# Patient Record
Sex: Male | Born: 1935 | Race: White | Hispanic: No | Smoking: Former smoker
Health system: Southern US, Community
[De-identification: ages and names within clinical notes are randomized; demographics above are authoritative.]

## PROBLEM LIST (undated history)

## (undated) DIAGNOSIS — R600 Localized edema: Secondary | ICD-10-CM

## (undated) DIAGNOSIS — Q219 Congenital malformation of cardiac septum, unspecified: Secondary | ICD-10-CM

## (undated) DIAGNOSIS — I447 Left bundle-branch block, unspecified: Secondary | ICD-10-CM

## (undated) DIAGNOSIS — H3530 Unspecified macular degeneration: Secondary | ICD-10-CM

## (undated) DIAGNOSIS — K429 Umbilical hernia without obstruction or gangrene: Secondary | ICD-10-CM

## (undated) DIAGNOSIS — G4733 Obstructive sleep apnea (adult) (pediatric): Secondary | ICD-10-CM

## (undated) DIAGNOSIS — E669 Obesity, unspecified: Secondary | ICD-10-CM

## (undated) DIAGNOSIS — Z23 Encounter for immunization: Secondary | ICD-10-CM

## (undated) DIAGNOSIS — Z950 Presence of cardiac pacemaker: Secondary | ICD-10-CM

## (undated) DIAGNOSIS — M479 Spondylosis, unspecified: Secondary | ICD-10-CM

## (undated) DIAGNOSIS — E785 Hyperlipidemia, unspecified: Secondary | ICD-10-CM

## (undated) DIAGNOSIS — D649 Anemia, unspecified: Secondary | ICD-10-CM

## (undated) DIAGNOSIS — I441 Atrioventricular block, second degree: Secondary | ICD-10-CM

## (undated) DIAGNOSIS — Z86010 Personal history of colonic polyps: Secondary | ICD-10-CM

## (undated) DIAGNOSIS — I442 Atrioventricular block, complete: Secondary | ICD-10-CM

## (undated) DIAGNOSIS — Z5189 Encounter for other specified aftercare: Secondary | ICD-10-CM

## (undated) DIAGNOSIS — K219 Gastro-esophageal reflux disease without esophagitis: Secondary | ICD-10-CM

## (undated) DIAGNOSIS — I872 Venous insufficiency (chronic) (peripheral): Secondary | ICD-10-CM

## (undated) DIAGNOSIS — C61 Malignant neoplasm of prostate: Secondary | ICD-10-CM

## (undated) DIAGNOSIS — K5730 Diverticulosis of large intestine without perforation or abscess without bleeding: Secondary | ICD-10-CM

## (undated) DIAGNOSIS — R413 Other amnesia: Secondary | ICD-10-CM

## (undated) DIAGNOSIS — I4460 Unspecified fascicular block: Secondary | ICD-10-CM

## (undated) DIAGNOSIS — E7800 Pure hypercholesterolemia, unspecified: Secondary | ICD-10-CM

## (undated) DIAGNOSIS — IMO0001 Reserved for inherently not codable concepts without codable children: Secondary | ICD-10-CM

## (undated) DIAGNOSIS — L409 Psoriasis, unspecified: Secondary | ICD-10-CM

## (undated) HISTORY — DX: Obstructive sleep apnea (adult) (pediatric): G47.33

## (undated) HISTORY — DX: Gastro-esophageal reflux disease without esophagitis: K21.9

## (undated) HISTORY — DX: Encounter for immunization: Z23

## (undated) HISTORY — DX: Spondylosis, unspecified: M47.9

## (undated) HISTORY — DX: Presence of cardiac pacemaker: Z95.0

## (undated) HISTORY — DX: Diverticulosis of large intestine without perforation or abscess without bleeding: K57.30

## (undated) HISTORY — DX: Personal history of colonic polyps: Z86.010

## (undated) HISTORY — DX: Hyperlipidemia, unspecified: E78.5

## (undated) HISTORY — DX: Encounter for other specified aftercare: Z51.89

## (undated) HISTORY — DX: Malignant neoplasm of prostate: C61

## (undated) HISTORY — DX: Unspecified macular degeneration: H35.30

## (undated) HISTORY — DX: Pure hypercholesterolemia, unspecified: E78.00

## (undated) HISTORY — DX: Venous insufficiency (chronic) (peripheral): I87.2

## (undated) HISTORY — DX: Obesity, unspecified: E66.9

## (undated) HISTORY — DX: Unspecified fascicular block: I44.60

## (undated) HISTORY — DX: Localized edema: R60.0

## (undated) HISTORY — DX: Umbilical hernia without obstruction or gangrene: K42.9

## (undated) HISTORY — DX: Other amnesia: R41.3

## (undated) HISTORY — DX: Psoriasis, unspecified: L40.9

## (undated) HISTORY — DX: Reserved for inherently not codable concepts without codable children: IMO0001

## (undated) HISTORY — DX: Left bundle-branch block, unspecified: I44.7

## (undated) HISTORY — DX: Atrioventricular block, complete: I44.2

## (undated) HISTORY — DX: Atrioventricular block, second degree: I44.1

## (undated) HISTORY — PX: TONSILLECTOMY: SHX5217

## (undated) HISTORY — DX: Congenital malformation of cardiac septum, unspecified: Q21.9

## (undated) HISTORY — DX: Anemia, unspecified: D64.9

---

## 1998-05-10 ENCOUNTER — Encounter: Admission: RE | Admit: 1998-05-10 | Discharge: 1998-05-10 | Payer: Self-pay | Admitting: Family Medicine

## 1998-05-17 ENCOUNTER — Encounter: Admission: RE | Admit: 1998-05-17 | Discharge: 1998-05-17 | Payer: Self-pay | Admitting: Family Medicine

## 1998-06-29 ENCOUNTER — Encounter: Admission: RE | Admit: 1998-06-29 | Discharge: 1998-06-29 | Payer: Self-pay | Admitting: Family Medicine

## 1998-07-13 ENCOUNTER — Encounter: Admission: RE | Admit: 1998-07-13 | Discharge: 1998-07-13 | Payer: Self-pay | Admitting: Family Medicine

## 1998-08-24 ENCOUNTER — Encounter: Admission: RE | Admit: 1998-08-24 | Discharge: 1998-08-24 | Payer: Self-pay | Admitting: Family Medicine

## 1998-10-05 ENCOUNTER — Encounter: Admission: RE | Admit: 1998-10-05 | Discharge: 1998-10-05 | Payer: Self-pay | Admitting: Family Medicine

## 1998-11-16 ENCOUNTER — Encounter: Admission: RE | Admit: 1998-11-16 | Discharge: 1998-11-16 | Payer: Self-pay | Admitting: Family Medicine

## 1999-06-20 ENCOUNTER — Encounter: Admission: RE | Admit: 1999-06-20 | Discharge: 1999-06-20 | Payer: Self-pay | Admitting: Family Medicine

## 1999-06-27 ENCOUNTER — Encounter: Admission: RE | Admit: 1999-06-27 | Discharge: 1999-06-27 | Payer: Self-pay | Admitting: Family Medicine

## 2000-02-27 ENCOUNTER — Ambulatory Visit (HOSPITAL_COMMUNITY): Admission: RE | Admit: 2000-02-27 | Discharge: 2000-02-27 | Payer: Self-pay | Admitting: Family Medicine

## 2000-02-27 ENCOUNTER — Encounter: Admission: RE | Admit: 2000-02-27 | Discharge: 2000-02-27 | Payer: Self-pay | Admitting: Sports Medicine

## 2000-11-12 ENCOUNTER — Encounter: Admission: RE | Admit: 2000-11-12 | Discharge: 2000-11-12 | Payer: Self-pay | Admitting: Family Medicine

## 2000-12-11 ENCOUNTER — Encounter: Admission: RE | Admit: 2000-12-11 | Discharge: 2000-12-11 | Payer: Self-pay | Admitting: Family Medicine

## 2001-12-03 ENCOUNTER — Encounter: Admission: RE | Admit: 2001-12-03 | Discharge: 2001-12-03 | Payer: Self-pay | Admitting: Family Medicine

## 2001-12-25 ENCOUNTER — Encounter: Admission: RE | Admit: 2001-12-25 | Discharge: 2001-12-25 | Payer: Self-pay | Admitting: Family Medicine

## 2002-02-13 ENCOUNTER — Encounter: Admission: RE | Admit: 2002-02-13 | Discharge: 2002-02-13 | Payer: Self-pay | Admitting: Family Medicine

## 2002-03-13 ENCOUNTER — Encounter (INDEPENDENT_AMBULATORY_CARE_PROVIDER_SITE_OTHER): Payer: Self-pay | Admitting: Specialist

## 2002-03-13 ENCOUNTER — Ambulatory Visit (HOSPITAL_COMMUNITY): Admission: RE | Admit: 2002-03-13 | Discharge: 2002-03-13 | Payer: Self-pay | Admitting: Gastroenterology

## 2002-04-13 ENCOUNTER — Ambulatory Visit (HOSPITAL_COMMUNITY): Admission: RE | Admit: 2002-04-13 | Discharge: 2002-04-13 | Payer: Self-pay | Admitting: Surgery

## 2002-04-13 ENCOUNTER — Encounter: Payer: Self-pay | Admitting: Surgery

## 2002-04-21 ENCOUNTER — Ambulatory Visit (HOSPITAL_COMMUNITY): Admission: RE | Admit: 2002-04-21 | Discharge: 2002-04-21 | Payer: Self-pay | Admitting: Family Medicine

## 2002-04-21 ENCOUNTER — Encounter: Admission: RE | Admit: 2002-04-21 | Discharge: 2002-04-21 | Payer: Self-pay | Admitting: Family Medicine

## 2002-07-09 HISTORY — PX: PARTIAL COLECTOMY: SHX5273

## 2002-07-10 ENCOUNTER — Encounter (INDEPENDENT_AMBULATORY_CARE_PROVIDER_SITE_OTHER): Payer: Self-pay | Admitting: Specialist

## 2002-07-10 ENCOUNTER — Inpatient Hospital Stay (HOSPITAL_COMMUNITY): Admission: RE | Admit: 2002-07-10 | Discharge: 2002-07-17 | Payer: Self-pay | Admitting: Surgery

## 2002-10-14 ENCOUNTER — Encounter: Admission: RE | Admit: 2002-10-14 | Discharge: 2002-10-14 | Payer: Self-pay | Admitting: Family Medicine

## 2003-03-02 ENCOUNTER — Encounter: Admission: RE | Admit: 2003-03-02 | Discharge: 2003-03-02 | Payer: Self-pay | Admitting: Family Medicine

## 2003-03-10 ENCOUNTER — Encounter: Admission: RE | Admit: 2003-03-10 | Discharge: 2003-03-10 | Payer: Self-pay | Admitting: Family Medicine

## 2003-06-09 ENCOUNTER — Encounter: Admission: RE | Admit: 2003-06-09 | Discharge: 2003-06-09 | Payer: Self-pay | Admitting: Family Medicine

## 2003-06-11 ENCOUNTER — Ambulatory Visit (HOSPITAL_COMMUNITY): Admission: RE | Admit: 2003-06-11 | Discharge: 2003-06-11 | Payer: Self-pay | Admitting: Family Medicine

## 2003-06-16 ENCOUNTER — Ambulatory Visit (HOSPITAL_COMMUNITY): Admission: RE | Admit: 2003-06-16 | Discharge: 2003-06-16 | Payer: Self-pay | Admitting: Family Medicine

## 2003-09-10 ENCOUNTER — Encounter: Admission: RE | Admit: 2003-09-10 | Discharge: 2003-09-10 | Payer: Self-pay | Admitting: Sports Medicine

## 2004-07-04 ENCOUNTER — Encounter: Admission: RE | Admit: 2004-07-04 | Discharge: 2004-07-04 | Payer: Self-pay | Admitting: Family Medicine

## 2004-12-19 ENCOUNTER — Ambulatory Visit: Payer: Self-pay | Admitting: Sports Medicine

## 2004-12-27 ENCOUNTER — Encounter: Admission: RE | Admit: 2004-12-27 | Discharge: 2004-12-27 | Payer: Self-pay | Admitting: Family Medicine

## 2004-12-27 ENCOUNTER — Ambulatory Visit: Payer: Self-pay | Admitting: Family Medicine

## 2004-12-28 ENCOUNTER — Ambulatory Visit: Payer: Self-pay | Admitting: Family Medicine

## 2005-01-02 ENCOUNTER — Ambulatory Visit (HOSPITAL_COMMUNITY): Admission: RE | Admit: 2005-01-02 | Discharge: 2005-01-02 | Payer: Self-pay | Admitting: Family Medicine

## 2005-01-02 ENCOUNTER — Ambulatory Visit: Payer: Self-pay | Admitting: Family Medicine

## 2005-01-03 ENCOUNTER — Ambulatory Visit: Payer: Self-pay | Admitting: Family Medicine

## 2005-01-17 ENCOUNTER — Inpatient Hospital Stay (HOSPITAL_COMMUNITY): Admission: EM | Admit: 2005-01-17 | Discharge: 2005-01-19 | Payer: Self-pay | Admitting: Internal Medicine

## 2005-01-18 DIAGNOSIS — Z950 Presence of cardiac pacemaker: Secondary | ICD-10-CM

## 2005-01-18 HISTORY — DX: Presence of cardiac pacemaker: Z95.0

## 2005-01-18 HISTORY — PX: PACEMAKER PLACEMENT: SHX43

## 2005-02-03 LAB — HM COLONOSCOPY

## 2005-02-06 ENCOUNTER — Ambulatory Visit: Payer: Self-pay | Admitting: Family Medicine

## 2005-03-03 DIAGNOSIS — Z860100 Personal history of colon polyps, unspecified: Secondary | ICD-10-CM

## 2005-03-03 HISTORY — DX: Personal history of colon polyps, unspecified: Z86.0100

## 2005-03-03 HISTORY — DX: Personal history of colonic polyps: Z86.010

## 2005-04-04 ENCOUNTER — Ambulatory Visit: Payer: Self-pay | Admitting: Family Medicine

## 2005-04-06 ENCOUNTER — Ambulatory Visit: Payer: Self-pay | Admitting: Family Medicine

## 2005-04-25 ENCOUNTER — Emergency Department (HOSPITAL_COMMUNITY): Admission: EM | Admit: 2005-04-25 | Discharge: 2005-04-25 | Payer: Self-pay | Admitting: Emergency Medicine

## 2005-05-04 ENCOUNTER — Ambulatory Visit: Payer: Self-pay | Admitting: Sports Medicine

## 2005-05-04 ENCOUNTER — Ambulatory Visit (HOSPITAL_COMMUNITY): Admission: RE | Admit: 2005-05-04 | Discharge: 2005-05-04 | Payer: Self-pay | Admitting: Family Medicine

## 2005-05-07 ENCOUNTER — Ambulatory Visit: Payer: Self-pay | Admitting: Family Medicine

## 2005-05-23 ENCOUNTER — Ambulatory Visit: Payer: Self-pay | Admitting: Family Medicine

## 2005-05-23 ENCOUNTER — Ambulatory Visit (HOSPITAL_COMMUNITY): Admission: RE | Admit: 2005-05-23 | Discharge: 2005-05-23 | Payer: Self-pay | Admitting: Family Medicine

## 2006-03-18 ENCOUNTER — Encounter: Payer: Self-pay | Admitting: Family Medicine

## 2006-04-24 ENCOUNTER — Ambulatory Visit: Payer: Self-pay | Admitting: Family Medicine

## 2006-05-14 ENCOUNTER — Ambulatory Visit: Payer: Self-pay | Admitting: Family Medicine

## 2006-07-02 HISTORY — PX: OTHER SURGICAL HISTORY: SHX169

## 2006-09-02 ENCOUNTER — Encounter: Admission: RE | Admit: 2006-09-02 | Discharge: 2006-09-02 | Payer: Self-pay | Admitting: Family Medicine

## 2006-09-02 ENCOUNTER — Ambulatory Visit: Payer: Self-pay | Admitting: Sports Medicine

## 2006-09-05 ENCOUNTER — Encounter: Admission: RE | Admit: 2006-09-05 | Discharge: 2006-09-09 | Payer: Self-pay | Admitting: Family Medicine

## 2006-12-05 ENCOUNTER — Ambulatory Visit: Payer: Self-pay | Admitting: Family Medicine

## 2006-12-08 ENCOUNTER — Emergency Department (HOSPITAL_COMMUNITY): Admission: EM | Admit: 2006-12-08 | Discharge: 2006-12-08 | Payer: Self-pay | Admitting: Emergency Medicine

## 2006-12-10 ENCOUNTER — Inpatient Hospital Stay (HOSPITAL_COMMUNITY): Admission: EM | Admit: 2006-12-10 | Discharge: 2006-12-11 | Payer: Self-pay | Admitting: *Deleted

## 2007-01-02 DIAGNOSIS — L408 Other psoriasis: Secondary | ICD-10-CM

## 2007-01-02 DIAGNOSIS — M479 Spondylosis, unspecified: Secondary | ICD-10-CM | POA: Insufficient documentation

## 2007-01-02 DIAGNOSIS — C61 Malignant neoplasm of prostate: Secondary | ICD-10-CM

## 2007-01-02 DIAGNOSIS — G4730 Sleep apnea, unspecified: Secondary | ICD-10-CM | POA: Insufficient documentation

## 2007-01-02 DIAGNOSIS — N400 Enlarged prostate without lower urinary tract symptoms: Secondary | ICD-10-CM | POA: Insufficient documentation

## 2007-01-02 DIAGNOSIS — E785 Hyperlipidemia, unspecified: Secondary | ICD-10-CM

## 2007-01-02 DIAGNOSIS — I872 Venous insufficiency (chronic) (peripheral): Secondary | ICD-10-CM | POA: Insufficient documentation

## 2007-01-02 DIAGNOSIS — H3530 Unspecified macular degeneration: Secondary | ICD-10-CM | POA: Insufficient documentation

## 2007-01-02 DIAGNOSIS — E669 Obesity, unspecified: Secondary | ICD-10-CM

## 2007-01-14 ENCOUNTER — Encounter: Payer: Self-pay | Admitting: Family Medicine

## 2007-05-14 ENCOUNTER — Encounter: Payer: Self-pay | Admitting: Family Medicine

## 2007-05-26 ENCOUNTER — Encounter: Payer: Self-pay | Admitting: Family Medicine

## 2007-05-27 ENCOUNTER — Ambulatory Visit: Payer: Self-pay | Admitting: Family Medicine

## 2007-05-27 DIAGNOSIS — H9190 Unspecified hearing loss, unspecified ear: Secondary | ICD-10-CM | POA: Insufficient documentation

## 2007-05-27 LAB — CONVERTED CEMR LAB
Hemoglobin: 12.5 g/dL — ABNORMAL LOW (ref 13.0–17.0)
MCHC: 32.7 g/dL (ref 30.0–36.0)
MCV: 93.6 fL (ref 78.0–100.0)
RBC: 4.08 M/uL — ABNORMAL LOW (ref 4.22–5.81)
RDW: 14.5 % — ABNORMAL HIGH (ref 11.5–14.0)

## 2007-05-28 ENCOUNTER — Encounter: Payer: Self-pay | Admitting: Family Medicine

## 2007-05-30 ENCOUNTER — Ambulatory Visit: Payer: Self-pay | Admitting: Family Medicine

## 2007-05-30 ENCOUNTER — Encounter: Payer: Self-pay | Admitting: Family Medicine

## 2007-05-30 LAB — CONVERTED CEMR LAB
Alkaline Phosphatase: 73 units/L (ref 39–117)
BUN: 10 mg/dL (ref 6–23)
Glucose, Bld: 138 mg/dL — ABNORMAL HIGH (ref 70–99)
HDL: 47 mg/dL (ref >39)
LDL Cholesterol: 130 mg/dL — ABNORMAL HIGH (ref 0–99)
Total Bilirubin: 0.9 mg/dL (ref 0.3–1.2)
Total CHOL/HDL Ratio: 4.2
Triglycerides: 106 mg/dL (ref <150)
VLDL: 21 mg/dL (ref 0–40)

## 2007-06-02 ENCOUNTER — Encounter: Payer: Self-pay | Admitting: Family Medicine

## 2007-06-23 ENCOUNTER — Telehealth: Payer: Self-pay | Admitting: *Deleted

## 2007-07-03 ENCOUNTER — Ambulatory Visit: Payer: Self-pay | Admitting: Family Medicine

## 2007-07-03 DIAGNOSIS — K429 Umbilical hernia without obstruction or gangrene: Secondary | ICD-10-CM | POA: Insufficient documentation

## 2007-07-23 ENCOUNTER — Encounter: Payer: Self-pay | Admitting: Family Medicine

## 2007-07-25 ENCOUNTER — Encounter: Admission: RE | Admit: 2007-07-25 | Discharge: 2007-10-15 | Payer: Self-pay | Admitting: Family Medicine

## 2007-07-28 ENCOUNTER — Encounter: Payer: Self-pay | Admitting: Family Medicine

## 2007-08-11 ENCOUNTER — Encounter: Payer: Self-pay | Admitting: Family Medicine

## 2007-09-16 ENCOUNTER — Telehealth: Payer: Self-pay | Admitting: *Deleted

## 2008-03-08 ENCOUNTER — Encounter: Payer: Self-pay | Admitting: Family Medicine

## 2008-05-17 ENCOUNTER — Encounter: Payer: Self-pay | Admitting: Family Medicine

## 2008-05-18 ENCOUNTER — Encounter: Payer: Self-pay | Admitting: Family Medicine

## 2008-06-04 ENCOUNTER — Telehealth: Payer: Self-pay | Admitting: *Deleted

## 2008-06-21 ENCOUNTER — Encounter: Payer: Self-pay | Admitting: Family Medicine

## 2008-06-30 ENCOUNTER — Encounter: Payer: Self-pay | Admitting: Family Medicine

## 2008-07-01 ENCOUNTER — Ambulatory Visit: Payer: Self-pay | Admitting: Family Medicine

## 2008-07-01 DIAGNOSIS — R6889 Other general symptoms and signs: Secondary | ICD-10-CM

## 2008-07-01 LAB — CONVERTED CEMR LAB
AST: 29 units/L (ref 0–37)
Albumin: 4.1 g/dL (ref 3.5–5.2)
BUN: 9 mg/dL (ref 6–23)
Calcium: 9 mg/dL (ref 8.4–10.5)
Chloride: 101 meq/L (ref 96–112)
Glucose, Bld: 147 mg/dL — ABNORMAL HIGH (ref 70–99)
HDL: 50 mg/dL (ref >39)
Hemoglobin: 12.7 g/dL — ABNORMAL LOW (ref 13.0–17.0)
Hgb A1c MFr Bld: 7 %
Potassium: 4.3 meq/L (ref 3.5–5.3)
RBC: 3.99 M/uL — ABNORMAL LOW (ref 4.22–5.81)
RDW: 13.9 % (ref 11.5–15.5)
TSH: 2.491 microintl units/mL (ref 0.350–4.50)
WBC: 4 10*3/uL (ref 4.0–10.5)

## 2008-07-05 ENCOUNTER — Telehealth: Payer: Self-pay | Admitting: Family Medicine

## 2008-07-05 ENCOUNTER — Encounter: Payer: Self-pay | Admitting: Family Medicine

## 2008-07-06 ENCOUNTER — Encounter: Payer: Self-pay | Admitting: Family Medicine

## 2008-07-23 ENCOUNTER — Telehealth: Payer: Self-pay | Admitting: Family Medicine

## 2008-07-26 ENCOUNTER — Encounter: Payer: Self-pay | Admitting: Family Medicine

## 2008-08-06 ENCOUNTER — Encounter: Payer: Self-pay | Admitting: Family Medicine

## 2008-08-15 ENCOUNTER — Encounter: Payer: Self-pay | Admitting: Family Medicine

## 2008-08-16 ENCOUNTER — Telehealth: Payer: Self-pay | Admitting: *Deleted

## 2008-09-07 ENCOUNTER — Ambulatory Visit: Payer: Self-pay | Admitting: Family Medicine

## 2008-09-07 LAB — CONVERTED CEMR LAB
HDL goal, serum: 40 mg/dL
LDL Goal: 100 mg/dL

## 2008-11-08 ENCOUNTER — Encounter: Payer: Self-pay | Admitting: Family Medicine

## 2008-12-23 ENCOUNTER — Encounter: Payer: Self-pay | Admitting: Family Medicine

## 2009-01-11 ENCOUNTER — Ambulatory Visit: Payer: Self-pay | Admitting: Family Medicine

## 2009-01-11 LAB — CONVERTED CEMR LAB
BUN: 18 mg/dL (ref 6–23)
CO2: 24 meq/L (ref 19–32)
Calcium: 9.1 mg/dL (ref 8.4–10.5)
Creatinine, Ser: 0.99 mg/dL (ref 0.40–1.50)
Glucose, Bld: 108 mg/dL — ABNORMAL HIGH (ref 70–99)
Hgb A1c MFr Bld: 6.4 %

## 2009-01-12 ENCOUNTER — Telehealth: Payer: Self-pay | Admitting: Family Medicine

## 2009-02-02 ENCOUNTER — Encounter: Payer: Self-pay | Admitting: Family Medicine

## 2009-02-07 ENCOUNTER — Ambulatory Visit: Payer: Self-pay | Admitting: Family Medicine

## 2009-02-07 DIAGNOSIS — N62 Hypertrophy of breast: Secondary | ICD-10-CM | POA: Insufficient documentation

## 2009-02-07 LAB — CONVERTED CEMR LAB
Nitrite: NEGATIVE
Urobilinogen, UA: 0.2
WBC Urine, dipstick: NEGATIVE

## 2009-03-25 ENCOUNTER — Encounter: Payer: Self-pay | Admitting: Family Medicine

## 2009-04-12 ENCOUNTER — Ambulatory Visit: Payer: Self-pay | Admitting: Family Medicine

## 2009-04-21 ENCOUNTER — Encounter: Admission: RE | Admit: 2009-04-21 | Discharge: 2009-04-21 | Payer: Self-pay | Admitting: Family Medicine

## 2009-04-21 ENCOUNTER — Encounter: Payer: Self-pay | Admitting: Family Medicine

## 2009-07-12 ENCOUNTER — Ambulatory Visit: Payer: Self-pay | Admitting: Family Medicine

## 2009-07-12 DIAGNOSIS — B351 Tinea unguium: Secondary | ICD-10-CM | POA: Insufficient documentation

## 2009-07-15 ENCOUNTER — Encounter: Payer: Self-pay | Admitting: Family Medicine

## 2009-08-09 ENCOUNTER — Encounter: Payer: Self-pay | Admitting: Family Medicine

## 2009-08-09 ENCOUNTER — Ambulatory Visit: Payer: Self-pay | Admitting: Family Medicine

## 2009-08-09 LAB — CONVERTED CEMR LAB
AST: 34 units/L (ref 0–37)
Alkaline Phosphatase: 72 units/L (ref 39–117)
BUN: 10 mg/dL (ref 6–23)
Calcium: 8.6 mg/dL (ref 8.4–10.5)
Creatinine, Ser: 1.03 mg/dL (ref 0.40–1.50)
HCT: 37 % — ABNORMAL LOW (ref 39.0–52.0)
HDL: 51 mg/dL (ref >39)
Hemoglobin: 11.9 g/dL — ABNORMAL LOW (ref 13.0–17.0)
LDL Cholesterol: 58 mg/dL (ref 0–99)
MCHC: 32.2 g/dL (ref 30.0–36.0)
RDW: 14.3 % (ref 11.5–15.5)
Total CHOL/HDL Ratio: 2.5
Triglycerides: 80 mg/dL (ref <150)

## 2009-08-12 ENCOUNTER — Encounter: Payer: Self-pay | Admitting: Family Medicine

## 2009-08-24 ENCOUNTER — Telehealth: Payer: Self-pay | Admitting: Family Medicine

## 2009-08-25 ENCOUNTER — Telehealth: Payer: Self-pay | Admitting: Family Medicine

## 2009-08-29 ENCOUNTER — Emergency Department (HOSPITAL_COMMUNITY): Admission: EM | Admit: 2009-08-29 | Discharge: 2009-08-29 | Payer: Self-pay | Admitting: Emergency Medicine

## 2009-09-07 ENCOUNTER — Encounter: Payer: Self-pay | Admitting: Internal Medicine

## 2009-09-13 ENCOUNTER — Telehealth (INDEPENDENT_AMBULATORY_CARE_PROVIDER_SITE_OTHER): Payer: Self-pay | Admitting: *Deleted

## 2009-09-13 DIAGNOSIS — Z950 Presence of cardiac pacemaker: Secondary | ICD-10-CM | POA: Insufficient documentation

## 2009-09-14 ENCOUNTER — Ambulatory Visit: Payer: Self-pay | Admitting: Internal Medicine

## 2009-09-23 ENCOUNTER — Encounter: Payer: Self-pay | Admitting: Family Medicine

## 2009-10-06 ENCOUNTER — Encounter: Payer: Self-pay | Admitting: Family Medicine

## 2009-10-06 LAB — HM DIABETES EYE EXAM

## 2010-01-03 ENCOUNTER — Encounter: Payer: Self-pay | Admitting: *Deleted

## 2010-01-24 ENCOUNTER — Ambulatory Visit: Payer: Self-pay | Admitting: Family Medicine

## 2010-01-24 LAB — CONVERTED CEMR LAB
BUN: 14 mg/dL (ref 6–23)
CO2: 23 meq/L (ref 19–32)
Calcium: 9.1 mg/dL (ref 8.4–10.5)
Creatinine, Ser: 1.01 mg/dL (ref 0.40–1.50)
Hgb A1c MFr Bld: 6.4 %

## 2010-01-25 ENCOUNTER — Encounter: Payer: Self-pay | Admitting: Family Medicine

## 2010-03-23 ENCOUNTER — Encounter: Payer: Self-pay | Admitting: Family Medicine

## 2010-04-12 ENCOUNTER — Ambulatory Visit (HOSPITAL_COMMUNITY): Admission: RE | Admit: 2010-04-12 | Discharge: 2010-04-12 | Payer: Self-pay | Admitting: Urology

## 2010-05-02 ENCOUNTER — Ambulatory Visit: Payer: Self-pay | Admitting: Family Medicine

## 2010-05-02 DIAGNOSIS — D649 Anemia, unspecified: Secondary | ICD-10-CM

## 2010-05-03 LAB — CONVERTED CEMR LAB
MCHC: 34.8 g/dL (ref 30.0–36.0)
Platelets: 125 10*3/uL — ABNORMAL LOW (ref 150–400)
RBC: 3.73 M/uL — ABNORMAL LOW (ref 4.22–5.81)
TSH: 2.041 microintl units/mL (ref 0.350–4.500)
Vit D, 25-Hydroxy: 17 ng/mL — ABNORMAL LOW (ref 30–89)

## 2010-06-05 ENCOUNTER — Inpatient Hospital Stay (HOSPITAL_COMMUNITY): Admission: AD | Admit: 2010-06-05 | Discharge: 2010-06-07 | Payer: Self-pay | Admitting: Cardiology

## 2010-06-06 HISTORY — PX: OTHER SURGICAL HISTORY: SHX169

## 2010-07-18 ENCOUNTER — Ambulatory Visit: Payer: Self-pay | Admitting: Family Medicine

## 2010-08-10 ENCOUNTER — Ambulatory Visit: Payer: Self-pay | Admitting: Family Medicine

## 2010-08-28 ENCOUNTER — Emergency Department (HOSPITAL_COMMUNITY): Admission: EM | Admit: 2010-08-28 | Discharge: 2010-08-29 | Payer: Self-pay | Admitting: Emergency Medicine

## 2010-09-01 ENCOUNTER — Encounter (INDEPENDENT_AMBULATORY_CARE_PROVIDER_SITE_OTHER): Payer: Self-pay | Admitting: *Deleted

## 2010-09-23 ENCOUNTER — Encounter: Payer: Self-pay | Admitting: Family Medicine

## 2010-09-26 ENCOUNTER — Ambulatory Visit: Payer: Self-pay | Admitting: Family Medicine

## 2010-09-26 DIAGNOSIS — C4441 Basal cell carcinoma of skin of scalp and neck: Secondary | ICD-10-CM

## 2010-09-26 LAB — HM DIABETES FOOT EXAM

## 2010-10-11 ENCOUNTER — Encounter: Payer: Self-pay | Admitting: Family Medicine

## 2010-10-16 ENCOUNTER — Telehealth: Payer: Self-pay | Admitting: *Deleted

## 2010-10-19 ENCOUNTER — Ambulatory Visit: Payer: Self-pay

## 2010-12-04 ENCOUNTER — Encounter: Payer: Self-pay | Admitting: Family Medicine

## 2010-12-06 ENCOUNTER — Encounter: Payer: Self-pay | Admitting: Family Medicine

## 2010-12-07 ENCOUNTER — Ambulatory Visit (INDEPENDENT_AMBULATORY_CARE_PROVIDER_SITE_OTHER): Payer: Medicare Other

## 2010-12-07 ENCOUNTER — Encounter: Payer: Self-pay | Admitting: Family Medicine

## 2010-12-07 ENCOUNTER — Ambulatory Visit: Admit: 2010-12-07 | Payer: Self-pay

## 2010-12-07 DIAGNOSIS — R609 Edema, unspecified: Secondary | ICD-10-CM

## 2010-12-07 DIAGNOSIS — E119 Type 2 diabetes mellitus without complications: Secondary | ICD-10-CM | POA: Insufficient documentation

## 2010-12-07 DIAGNOSIS — R413 Other amnesia: Secondary | ICD-10-CM

## 2010-12-07 LAB — CONVERTED CEMR LAB
AST: 29 units/L (ref 0–37)
BUN: 12 mg/dL (ref 6–23)
BUN: 12 mg/dL (ref 6–23)
CO2: 24 meq/L (ref 19–32)
Calcium: 8.7 mg/dL (ref 8.4–10.5)
Chloride: 103 meq/L (ref 96–112)
Cholesterol: 123 mg/dL (ref 0–200)
Cholesterol: 123 mg/dL (ref 0–200)
Creatinine, Ser: 0.96 mg/dL (ref 0.40–1.50)
Creatinine, Ser: 0.96 mg/dL (ref 0.40–1.50)
Glucose, Bld: 117 mg/dL — ABNORMAL HIGH (ref 70–99)
HDL: 47 mg/dL (ref >39)
Sodium: 139 meq/L (ref 135–145)
Total Bilirubin: 0.8 mg/dL (ref 0.3–1.2)
Total CHOL/HDL Ratio: 2.6
Total Protein: 6.9 g/dL (ref 6.0–8.3)
Triglycerides: 58 mg/dL (ref <150)
VLDL: 12 mg/dL (ref 0–40)

## 2010-12-07 NOTE — Progress Notes (Signed)
Summary: re: labs/TS  ---- Converted from flag ---- ---- 10/11/2010 2:33 PM, Zachery Dauer MD wrote: Please call [name] to remind him to come in fasting for lab tests when he comes to [facility] clinic the 15th. ------------------------------ called pt. unable to leave message 'memory full' Arlyss Repress CMA,  October 16, 2010 4:02 PM

## 2010-12-07 NOTE — Miscellaneous (Signed)
Summary: dx code correction  Clinical Lists Changes  Problems: Changed problem from PACEMAKER (ICD-V45.Marland Kitchen01) to PACEMAKER, PERMANENT (ICD-V45.01) changed the incorrect dx code to correct dx code Genella Mech  September 01, 2010 10:09 AM

## 2010-12-07 NOTE — Assessment & Plan Note (Signed)
Summary: simvastatin refill  Prescriptions: SIMVASTATIN 20 MG TABS (SIMVASTATIN) Take one tablet daily at bedtime  #30 Each x 1   Entered and Authorized by:   Zachery Dauer MD   Signed by:   Zachery Dauer MD on 10/11/2010   Method used:   Electronically to        [facility]       [address]       [place], [place]  [address]       Ph: [phone]       Fax: [phone]   RxID:   [id number]

## 2010-12-07 NOTE — Letter (Signed)
Summary: Results Follow-up Letter  [facility]  [address]   [place], [place] [address]   Phone: [phone]  Fax: [phone]    01/25/2010  [address] [place], [place]  [address]  Dear Mr. [facility],   The following are the results of your recent test(s): Patient: [name] Your blood tests are good. Your kidneys are functioning well.  Tests: (1) Basic Metabolic Panel (41324)   Sodium                    137 mEq/L                   135-145   Potassium                 4.3 mEq/L                   3.5-5.3   Chloride                  102 mEq/L                   96-112   CO2                       23 mEq/L                    19-32   Glucose              [H]  111 mg/dL                   40-10   BUN                       14 mg/dL                    2-72   Creatinine                1.01 mg/dL                  0.40-1.50   Calcium                   9.1 mg/dL                   5.3-66.4  Note: An exclamation mark (!) indicates a result that was not dispersed into the flowsheet. Document Creation Date: 01/24/2010 10:01 PM Sincerely,  Zachery Dauer MD [facility]            Appended Document: Results Follow-up Letter mailed.

## 2010-12-07 NOTE — Assessment & Plan Note (Signed)
Summary: [facility]   Vital Signs:  Patient profile:   77 year old male Height:      66 inches Weight:      204 pounds Temp:     98.2 degrees F oral Pulse rate:   79 / minute BP sitting:   117 / 64  (right arm) Cuff size:   regular  Vitals Entered By: Tessie Fass CMA (January 24, 2010 3:02 PM) CC: F/U Is Patient Diabetic? Yes Pain Assessment Patient in pain? no        Primary Care Provider:  Zachery Dauer MD  CC:  F/U.  History of Present Illness: Has been stressed by hospitalization of both daughters this past winter, and grand daughter is currently in [facility] for asthma.  Distracted and wonders about his memory.  Dr Dione Booze will follow-up on his L macular degeneration soon.   No falls in the past year.     Habits & Providers  Alcohol-Tobacco-Diet     Tobacco Status: never  Current Medications (verified): 1)  Metformin Hcl 500 Mg Xr24h-Tab (Metformin Hcl) .... Take 1 Tablet Daily Twice Daily 2)  Simvastatin 20 Mg Tabs (Simvastatin) .... Take One Tablet Daily At Bedtime 3)  Aspir-Low 81 Mg Tbec (Aspirin) .... Take One Tablet Daily  Allergies (verified): No Known Drug Allergies  Family History: Cancer ? Source killed father and brother at age 17 and PGM also at age 84 Mother died age 10 Dementia Mat Uncle - kidney failure Alzheimers - daughter, Vicente Serene. In hospital recently for falls COPD - daughter, Elease Hashimoto intubated, discharged March, 2011  Social History: Widowed 3/00, wife had Alzheimers Daughter Elease Hashimoto, visits and helps Daughter, Vicente Serene in [place] Daughter Aram Beecham, divorced  then remarried - estranged from family since 1980. Her daughters who live in [place] each have 2 children.  1 beer daily Used to smoke but now quit Retired  Physical Exam  General:   alert,appropriate and cooperative throughout examination, obese Lungs:  Normal respiratory effort, chest expands symmetrically. Lungs are clear to auscultation, no crackles or wheezes. Heart:  Normal rate and  regular rhythm. S1 and S2 normal without gallop, murmur, click, rub or other extra sounds. Abdomen:  soft, obese and non-tender.  Large diastasis recti bulge. 2 cm bulge 3 cm above the umbilicus, herniated tissue reduced with gentle pressure Psych:  brighter affect.  oriented,  Registers 3 words and recalls 3    Impression & Recommendations:  Problem # 1:  AODM (ICD-250.00) Assessment Improved  His updated medication list for this problem includes:    Metformin Hcl 500 Mg Xr24h-tab (Metformin hcl) .Marland Kitchen... Take 1 tablet daily twice daily    Aspir-low 81 Mg Tbec (Aspirin) .Marland Kitchen... Take one tablet daily  Orders: [facility]- Est  Level 4 (44010) Basic Met-[facility] ([address]-66440)  Problem # 2:  MEMORY LOSS (ICD-780.93) Benign senescent forgetfulness Orders: [facility]- Est  Level 4 (34742)  Problem # 3:  OBESITY, NOS (ICD-278.00) Assessment: Improved  Orders: [facility]- Est  Level 4 (59563)  Problem # 4:  MACULAR DEGENERATION (ICD-362.50) being treated by Dr Dione Booze  Complete Medication List: 1)  Metformin Hcl 500 Mg Xr24h-tab (Metformin hcl) .... Take 1 tablet daily twice daily 2)  Simvastatin 20 Mg Tabs (Simvastatin) .... Take one tablet daily at bedtime 3)  Aspir-low 81 Mg Tbec (Aspirin) .... Take one tablet daily  Other Orders: A1C-[facility] (87564)  Patient Instructions: 1)  Congratulations on losing 7 lbs 2)  Continue eating smaller portions.  3)  Please schedule a follow-up appointment in 3  months.  4)  It is important that your diabetic A1c level is checked every 3 months. Last time it was 6.8 and this time 6.4 which is under your goal of below 7.0 5)  Your blood pressure at 117/64 is below the goal of under 130/80.      Prevention & Chronic Care Immunizations   Influenza vaccine: given  (09/07/2008)   Influenza vaccine due: 07/07/2011    Tetanus booster: 06/05/2004: Done.   Tetanus booster due: 06/05/2014    Pneumococcal vaccine: Done.  (08/05/1998)   Pneumococcal vaccine deferral: Not  indicated  (07/12/2009)   Pneumococcal vaccine due: None    H. zoster vaccine: Not documented  Colorectal Screening   Hemoccult: Not documented   Hemoccult due: Not Indicated    Colonoscopy: Done.  (02/03/2005)   Colonoscopy due: 02/03/2010  Other Screening   PSA: 6.18  (03/25/2009)   PSA due due: 06/21/2009   Smoking status: never  (01/24/2010)  Diabetes Mellitus   HgbA1C: 6.4  (01/24/2010)   Hemoglobin A1C due: 04/13/2009    Eye exam: No diabetic retinopathy.   Cataract. OU Intraocular pressure OD:     16 Intraocular pressure OS:     15   (10/06/2009)   Diabetic eye exam action/deferral: Deferred  (07/12/2009)    Foot exam: yes  (07/12/2009)   High risk foot: Not documented   Foot care education: completed  (01/11/2009)   Foot exam due: 07/01/2009    Urine microalbumin/creatinine ratio: Not documented   Urine microalbumin/cr due: 07/01/2009    Diabetes flowsheet reviewed?: Yes   Progress toward A1C goal: At goal  Lipids   Total Cholesterol: 125  (08/09/2009)   LDL: 58  (08/09/2009)   LDL Direct: 72  (01/11/2009)   HDL: 51  (08/09/2009)   Triglycerides: 80  (08/09/2009)    SGOT (AST): 34  (08/09/2009)   SGPT (ALT): 19  (08/09/2009)   Alkaline phosphatase: 72  (08/09/2009)   Total bilirubin: 0.8  (08/09/2009)    Lipid flowsheet reviewed?: Yes   Progress toward LDL goal: At goal  Self-Management Support :   Personal Goals (by the next clinic visit) :     Personal A1C goal: 7  (07/12/2009)     Personal blood pressure goal: 130/80  (07/12/2009)     Personal LDL goal: 100  (07/12/2009)    Patient will work on the following items until the next clinic visit to reach self-care goals:     Medications and monitoring: bring all of my medications to every visit, examine my feet every day  (01/24/2010)     Eating: drink diet soda or water instead of juice or soda, eat more vegetables  (01/24/2010)     Activity: park at the far end of the parking lot   (01/24/2010)    Diabetes self-management support: Education handout  (07/12/2009)   Last diabetes self-management training by diabetes educator: completed    Lipid self-management support: Education handout  (07/12/2009)    Laboratory Results   Blood Tests   Date/Time Received: January 24, 2010 2:55 PM  Date/Time Reported: January 24, 2010 3:26 PM   HGBA1C: 6.4%   (Normal Range: Non-Diabetic - 3-6%   Control Diabetic - 6-8%)  Comments: ...........test performed by...........Marland KitchenTerese Door, CMA

## 2010-12-07 NOTE — Miscellaneous (Signed)
Summary: re: OV today/ts  Clinical Lists Changes pt was on dr.newton's schedule, because his dtr called for an appt per s.caldwell. the pt had no problems, did not know why he should be seen and wanted to see dr.hale only. pt also had to be somewhere and had no time for waiting. he r/s the appt to see dr.hale on 01-17-10.Arlyss Repress CMA,  January 03, 2010 4:08 PM

## 2010-12-07 NOTE — Assessment & Plan Note (Signed)
Summary: f/up,tcb   Vital Signs:  Patient profile:   77 year old male Height:      66 inches Weight:      206 pounds BMI:     33.37 Temp:     98.9 degrees F oral Pulse rate:   71 / minute Pulse rhythm:   regular BP sitting:   88 / 62  (left arm) Cuff size:   large  Vitals Entered By: Loralee Pacas CMA (July 18, 2010 2:39 PM) CC: F/U   Primary Care Provider:  Zachery Dauer MD  CC:  F/U.  History of Present Illness: Hasn't been eating much, but does snack a lot. does like barbecue.  Pacemaker was replaced Aug 3.   Knew his PSA had risen by didn't understand the scan that he had.   Has a spot on his right upper back that he thinks needs removed because it is growing.   Habits & Providers  Alcohol-Tobacco-Diet     Tobacco Status: never  Current Medications (verified): 1)  Metformin Hcl 500 Mg Xr24h-Tab (Metformin Hcl) .... Take 1 Tablet Daily Twice Daily 2)  Simvastatin 20 Mg Tabs (Simvastatin) .... Take One Tablet Daily At Bedtime 3)  Aspir-Low 81 Mg Tbec (Aspirin) .... Take One Tablet Daily 4)  Vitamin D3 1000 Unit Caps (Cholecalciferol) .... Take 2 Caps Daily 5)  Coenzyme Q10 100 Mg Caps (Coenzyme Q10) .... Take One Cap Daily 6)  Lisinopril 2.5 Mg Tabs (Lisinopril) .... Take One Tablet Daily  Allergies (verified): No Known Drug Allergies  Physical Exam  Lungs:  Normal respiratory effort, chest expands symmetrically. Lungs are clear to auscultation, no crackles or wheezes. Heart:  Normal rate and regular rhythm. S1 and S2 normal without gallop, murmur, click, rub or other extra sounds. Skin:  3 x 5 raised pearly lesion right upper back over upper scapula.    Impression & Recommendations:  Problem # 1:  AODM (ICD-250.00) Never stopped the low dose Lisionpril His updated medication list for this problem includes:    Metformin Hcl 500 Mg Xr24h-tab (Metformin hcl) .Marland Kitchen... Take 1 tablet daily twice daily    Aspir-low 81 Mg Tbec (Aspirin) .Marland Kitchen... Take one tablet  daily    Lisinopril 2.5 Mg Tabs (Lisinopril) .Marland Kitchen... Take one tablet daily  Orders: A1C-[facility] (46962) [facility]- Est  Level 4 (99214)Future Orders: Comp Met-[facility] ([id number]) ... 07/07/2011  Problem # 2:  PACEMAKER (ICD-V45.Marland Kitchen01) Recently replaced  Problem # 3:  PROSTATE CANCER (ICD-185) Reviewed  his negative bone scan with him Orders: [facility]- Est  Level 4 (01027)  Problem # 4:  OBESITY, NOS (ICD-278.00) Assessment: Improved  Orders: [facility]- Est  Level 4 (99214)  Problem # 5:  NEOPLASM, SKIN, UNCERTAIN BEHAVIOR (ICD-238.2) Probable basal cell CA Plan to remove next visit  Complete Medication List: 1)  Metformin Hcl 500 Mg Xr24h-tab (Metformin hcl) .... Take 1 tablet daily twice daily 2)  Simvastatin 20 Mg Tabs (Simvastatin) .... Take one tablet daily at bedtime 3)  Aspir-low 81 Mg Tbec (Aspirin) .... Take one tablet daily 4)  Vitamin D3 1000 Unit Caps (Cholecalciferol) .... Take 2 caps daily 5)  Coenzyme Q10 100 Mg Caps (Coenzyme q10) .... Take one cap daily 6)  Lisinopril 2.5 Mg Tabs (Lisinopril) .... Take one tablet daily  Other Orders: Future Orders: Lipid-[facility] ([id number]) ... 07/07/2011 CBC-[facility] (74259) ... 07/07/2011  Patient Instructions: 1)  Please make an appointment Oct 6th in geriatric clinic after 9:30 for fasting blood test and removal of skin cancer from back.  Laboratory Results   Blood Tests   Date/Time Received: July 18, 2010 3:26 PM  Date/Time Reported: July 18, 2010 3:44 PM   HGBA1C: 6.4%   (Normal Range: Non-Diabetic - 3-6%   Control Diabetic - 6-8%)  Comments: ...........test performed by.........Marland KitchenMarland KitchenArlyss Repress, CMA entered by Terese Door, CMA           Prevention & Chronic Care Immunizations   Influenza vaccine: given  (09/07/2008)   Influenza vaccine due: 07/07/2011    Tetanus booster: 06/05/2004: Done.   Tetanus booster due: 06/05/2014    Pneumococcal vaccine: Done.  (08/05/1998)   Pneumococcal vaccine deferral: Not indicated   (07/12/2009)   Pneumococcal vaccine due: None    H. zoster vaccine: Not documented  Colorectal Screening   Hemoccult: Not documented   Hemoccult due: Not Indicated    Colonoscopy: Done.  (02/03/2005)   Colonoscopy due: 02/03/2010  Other Screening   PSA: 6.18  (03/25/2009)   PSA due due: 06/21/2009   Smoking status: never  (07/18/2010)  Diabetes Mellitus   HgbA1C: 6.4  (07/18/2010)   Hemoglobin A1C due: 04/13/2009    Eye exam: No diabetic retinopathy.   Cataract. OU Intraocular pressure OD:     16 Intraocular pressure OS:     15   (10/06/2009)   Diabetic eye exam action/deferral: Deferred  (07/12/2009)    Foot exam: yes  (07/12/2009)   High risk foot: Not documented   Foot care education: completed  (01/11/2009)   Foot exam due: 07/01/2009    Urine microalbumin/creatinine ratio: Not documented   Urine microalbumin/cr due: 07/01/2009    Diabetes flowsheet reviewed?: Yes   Progress toward A1C goal: At goal  Lipids   Total Cholesterol: 125  (08/09/2009)   LDL: 58  (08/09/2009)   LDL Direct: 72  (01/11/2009)   HDL: 51  (08/09/2009)   Triglycerides: 80  (08/09/2009)    SGOT (AST): 34  (08/09/2009)   SGPT (ALT): 19  (08/09/2009) CMP ordered    Alkaline phosphatase: 72  (08/09/2009)   Total bilirubin: 0.8  (08/09/2009)    Lipid flowsheet reviewed?: Yes   Progress toward LDL goal: At goal  Self-Management Support :   Personal Goals (by the next clinic visit) :     Personal A1C goal: 7  (07/12/2009)     Personal blood pressure goal: 130/80  (07/12/2009)     Personal LDL goal: 100  (07/12/2009)    Diabetes self-management support: Education handout  (07/12/2009)   Last diabetes self-management training by diabetes educator: completed    Lipid self-management support: Education handout  (07/12/2009)

## 2010-12-07 NOTE — Assessment & Plan Note (Signed)
Summary: remove skin cancer from back/eo   Vital Signs:  Patient profile:   77 year old male Height:      66 inches Weight:      214.38 pounds BMI:     34.73 BSA:     2.06 Temp:     98.6 degrees F Pulse rate:   79 / minute BP sitting:   115 / 70  Vitals Entered By: Jone Baseman CMA (August 10, 2010 10:23 AM) CC: lesion removal Is Patient Diabetic? No Pain Assessment Patient in pain? no        Primary Care Provider:  Zachery Dauer MD  CC:  lesion removal.  History of Present Illness: He is still concerned about prostate metastases so we discussed his negative bone scan. He has no significant bone or joint pain. Only exercise is going up 14 steps in the house and walking in the stores when he shops.   The skin lesion on his upper back is still irritated and won't stay healed.   Habits & Providers  Alcohol-Tobacco-Diet     Tobacco Status: never  Allergies: No Known Drug Allergies  Physical Exam  Skin:  3 x 5 raised pearly lesion right upper back over upper scapula with telangiectasia. Multiple other areas of solar keratoses with a .5 cm verrucoid lesion with excoriation left dorsal wrist. ?psorriatic plaque left posterior scalp and seborrheic skin changes mainly behind left ear.    Impression & Recommendations:  Problem # 1:  NEOPLASM, SKIN, UNCERTAIN BEHAVIOR (ICD-238.2)  Probable basal cell sent for biopsy   [facility]  Problem # 2:  OBESITY, NOS (ICD-278.00) Assessment: Deteriorated  Complete Medication List: 1)  Metformin Hcl 500 Mg Xr24h-tab (Metformin hcl) .... Take 1 tablet daily twice daily 2)  Simvastatin 20 Mg Tabs (Simvastatin) .... Take one tablet daily at bedtime 3)  Aspir-low 81 Mg Tbec (Aspirin) .... Take one tablet daily 4)  Vitamin D3 1000 Unit Caps (Cholecalciferol) .... Take 2 caps daily 5)  Coenzyme Q10 100 Mg Caps (Coenzyme q10) .... Take one cap daily 6)  Lisinopril 2.5 Mg Tabs (Lisinopril) .... Take one  tablet daily  Patient Instructions: 1)  Please schedule a follow-up appointment in 1 month to recheck your skin. Keep it covered with antibiotic ointment until the biospy heals.  2)  Increase your exercise by walking 10 minutes three times a day.   Procedure Note Last Tetanus: Done. (06/05/2004)  Cyst Removal: The patient complains of suspicious lesion and changing lesion. Indication: changing lesion Consent signed: yes  Procedure #1: curretage and hyfurcation    Size (in cm): 0.5 x 0.3    Region: posterior    Location: back-upper-right    Comment: sent to pathology.  Well toleratd    Instrument used: curette    Anesthesia: 2.0 ml 1% lidocaine w/epinephrine  Cleaned and prepped with: alcohol and betadine Wound dressing: bacitracin and bandaid Additional Instructions: RTC in 1 month

## 2010-12-07 NOTE — Assessment & Plan Note (Signed)
Summary: f/up,tcb   Vital Signs:  Patient profile:   77 year old male Height:      66 inches Weight:      212.4 pounds BMI:     34.41 Temp:     98.3 degrees F oral Pulse rate:   76 / minute BP sitting:   110 / 64  (left arm) Cuff size:   regular  Vitals Entered By: Jimmy Footman, CMA (May 02, 2010 2:21 PM) CC: f/u Pain Assessment Patient in pain? no      Comments would like to talk about hearing decreased in rt ear   Primary Care Provider:  Zachery Dauer MD  CC:  f/u.  History of Present Illness: Draggy. Not very active. Walking the educational tv channels. Only walking when he needs to get someplace.   Has lots of keys and other heavy things in his overalls.   Arises at 9 AM. Goes to bed at , though is in the recliner the whole afternoon and evening.   Dr Annabell Howells did a bone scan for rising PSA which he says didn't show metastases.   Decreased hearing right ear.   Difficulty recalling terms and names. Never lost driving.   Fell down the steps without injury a week ago. No loss of consciousness   Habits & Providers  Alcohol-Tobacco-Diet     Tobacco Status: never  Current Medications (verified): 1)  Metformin Hcl 500 Mg Xr24h-Tab (Metformin Hcl) .... Take 1 Tablet Daily Twice Daily 2)  Simvastatin 20 Mg Tabs (Simvastatin) .... Take One Tablet Daily At Bedtime 3)  Aspir-Low 81 Mg Tbec (Aspirin) .... Take One Tablet Daily  Allergies (verified): No Known Drug Allergies  Physical Exam  General:   alert,appropriate and cooperative throughout examination, obese Ears:  R canal  cerumen occluding.  L canal and ear normal.    Lungs:  Normal respiratory effort, chest expands symmetrically. Lungs are clear to auscultation, no crackles or wheezes. Heart:  Normal rate and regular rhythm. S1 and S2 normal without gallop, murmur, click, rub or other extra sounds. Extremities:  Feet hammer toes left > right  trace left pedal edema and trace right pedal edema.   Psych:  Oriented  X3, memory intact for recent and remote, normally interactive, and good eye contact.     Impression & Recommendations:  Problem # 1:  FATIGUE (ICD-780.79)  Orders: TSH-[facility] ([id number]) [facility]- Est  Level 4 (09811)  Problem # 2:  ANEMIA (ICD-285.9)  Orders: CBC-[facility] (91478) [facility]- Est  Level 4 (29562)  Problem # 3:  WEAKNESS (ICD-780.79)  Orders: Vit D, 25 [facility] ([id number]) [facility]- Est  Level 4 (96295)  Problem # 4:  MEMORY LOSS (ICD-780.93) Not evident on Folstein MMSE Orders: [facility]- Est  Level 4 (99214)  Problem # 5:  DECREASED HEARING, RIGHT EAR (ICD-389.9) Cerumen. Debrox then return to flush  Problem # 6:  AODM (ICD-250.00) Well controlled. No hypoglycemia symptoms  His updated medication list for this problem includes:    Metformin Hcl 500 Mg Xr24h-tab (Metformin hcl) .Marland Kitchen... Take 1 tablet daily twice daily    Aspir-low 81 Mg Tbec (Aspirin) .Marland Kitchen... Take one tablet daily  Orders: A1C-[facility] (28413) [facility]- Est  Level 4 (24401)  Complete Medication List: 1)  Metformin Hcl 500 Mg Xr24h-tab (Metformin hcl) .... Take 1 tablet daily twice daily 2)  Simvastatin 20 Mg Tabs (Simvastatin) .... Take one tablet daily at bedtime 3)  Aspir-low 81 Mg Tbec (Aspirin) .... Take one tablet daily  Patient Instructions: 1)  Your A1c is 6.6 which is under the goal of less that 7.0 2)  Get Debrox drops or the equivalent at the pharmacy and apply twice daily. Make an appointment in Geri clinic Thursday AM to have it flushed if you wish.  3)  Otherwise, Please schedule a follow-up appointment in 3 months .   Laboratory Results   Blood Tests   Date/Time Received: May 02, 2010 2:21 PM  Date/Time Reported: May 02, 2010 2:42 PM   HGBA1C: 6.6%   (Normal Range: Non-Diabetic - 3-6%   Control Diabetic - 6-8%)  Comments: ........................test performed by..............Marland KitchenTessie Fass CMA       Geriatric Assessment:  Mental Status Exam: (value/max value)    Orientation to Time: 5/5     Orientation to Place: 5/5    Registration: 3/3    Attention/Calculation: 5/5    Recall: 3/3    Language-name 2 objects: 2/2    Language-repeat: 1/1    Language-follow 3-step command: 3/3    Language-read and follow direction: 1/1    Write a sentence: 1/1    Copy design: 1/1 MSE Total score: 30/30     Prevention & Chronic Care Immunizations   Influenza vaccine: given  (09/07/2008)   Influenza vaccine due: 07/07/2011    Tetanus booster: 06/05/2004: Done.   Tetanus booster due: 06/05/2014    Pneumococcal vaccine: Done.  (08/05/1998)   Pneumococcal vaccine deferral: Not indicated  (07/12/2009)   Pneumococcal vaccine due: None    H. zoster vaccine: Not documented  Colorectal Screening   Hemoccult: Not documented   Hemoccult due: Not Indicated    Colonoscopy: Done.  (02/03/2005)   Colonoscopy due: 02/03/2010  Other Screening   PSA: 6.18  (03/25/2009)   PSA due due: 06/21/2009   Smoking status: never  (05/02/2010)  Diabetes Mellitus   HgbA1C: 6.6  (05/02/2010)   Hemoglobin A1C due: 04/13/2009    Eye exam: No diabetic retinopathy.   Cataract. OU Intraocular pressure OD:     16 Intraocular pressure OS:     15   (10/06/2009)   Diabetic eye exam action/deferral: Deferred  (07/12/2009)    Foot exam: yes  (07/12/2009)   High risk foot: Not documented   Foot care education: completed  (01/11/2009)   Foot exam due: 07/01/2009    Urine microalbumin/creatinine ratio: Not documented   Urine microalbumin/cr due: 07/01/2009   Progress toward A1C goal: At goal  Lipids   Total Cholesterol: 125  (08/09/2009)   LDL: 58  (08/09/2009)   LDL Direct: 72  (01/11/2009)   HDL: 51  (08/09/2009)   Triglycerides: 80  (08/09/2009)    SGOT (AST): 34  (08/09/2009)   SGPT (ALT): 19  (08/09/2009)   Alkaline phosphatase: 72  (08/09/2009)   Total bilirubin: 0.8  (08/09/2009)    Lipid flowsheet reviewed?: Yes   Progress toward LDL goal: Unchanged  Self-Management Support :    Personal Goals (by the next clinic visit) :     Personal A1C goal: 7  (07/12/2009)     Personal blood pressure goal: 130/80  (07/12/2009)     Personal LDL goal: 100  (07/12/2009)    Diabetes self-management support: Education handout  (07/12/2009)   Last diabetes self-management training by diabetes educator: completed    Lipid self-management support: Education handout  (07/12/2009)

## 2010-12-07 NOTE — Assessment & Plan Note (Signed)
Summary: prob list rev   

## 2010-12-07 NOTE — Assessment & Plan Note (Signed)
Summary: f/u biopsy/bmc   Vital Signs:  Patient profile:   77 year old male Height:      66 inches Weight:      215 pounds BMI:     34.83 Pulse rate:   71 / minute BP sitting:   132 / 68  (left arm) Cuff size:   regular  Vitals Entered By: Tessie Fass CMA (September 26, 2010 1:48 PM) CC: F/U biopsy   Primary Care Provider:  Zachery Dauer MD  CC:  F/U biopsy.  History of Present Illness: No complaints. Thinks the biopsy site has healed well.   Allergies: No Known Drug Allergies  Family History: Cancer ? Source killed father and brother at age 49 and PGM also at age 85 Mother died age 59 Dementia Mat Uncle - kidney failure Alzheimers - daughter, Vicente Serene. In hospital recently for falls COPD - daughter, Elease Hashimoto broke her hip 7/11  Social History: Widowed 3/00, wife had Alzheimers Daughter Elease Hashimoto, severe COPD Daughter, Bebe in nursing home Grandaughter, Burman Foster, in [place] Daughter Aram Beecham, divorced  then remarried - estranged from family since 1980. Her daughters who live in [place] each have 2 children.  1 beer daily Used to smoke but now quit Retired  Physical Exam  General:   alert,appropriate and cooperative throughout examination, obese Skin:  Well healed biopsy site right lower neck.  Icthyosis of lower legs  Diabetes Management Exam:    Foot Exam (with socks and/or shoes not present):       Sensory-Pinprick/Light touch:          Left medial foot (L-4): normal          Left dorsal foot (L-5): normal          Left lateral foot (S-1): normal          Right medial foot (L-4): normal          Right dorsal foot (L-5): normal          Right lateral foot (S-1): normal       Sensory-Monofilament:          Left foot: normal          Right foot: normal       Inspection:          Left foot: normal          Right foot: normal       Nails:          Left foot: thickened          Right foot: thickened   Impression & Recommendations:  Problem # 1:   BASAL CELL CARCINOMA OF SCALP AND SKIN OF NECK (ICD-173.41) No sign of recurrence Orders: [facility]- Est Level  3 ([address])  Problem # 2:  OBESITY, NOS (ICD-278.00) Encouraged increased exercise.   Complete Medication List: 1)  Metformin Hcl 500 Mg Xr24h-tab (Metformin hcl) .... Take 1 tablet daily twice daily 2)  Simvastatin 20 Mg Tabs (Simvastatin) .... Take one tablet daily at bedtime 3)  Aspir-low 81 Mg Tbec (Aspirin) .... Take one tablet daily 4)  Vitamin D3 1000 Unit Caps (Cholecalciferol) .... Take 2 caps daily 5)  Coenzyme Q10 100 Mg Caps (Coenzyme q10) .... Take one cap daily 6)  Lisinopril 2.5 Mg Tabs (Lisinopril) .... Take one tablet daily  Patient Instructions: 1)  Please schedule a follow-up appointment in 1 month.  2)  Schedule in [facility] and we will do a full memory test and follow-up of your diabetes  Orders Added: 1)  [facility]- Est Level  3 [[address]]     Prevention & Chronic Care Immunizations   Influenza vaccine: given  (09/07/2008)   Influenza vaccine due: 07/07/2011    Tetanus booster: 06/05/2004: Done.   Tetanus booster due: 06/05/2014    Pneumococcal vaccine: Done.  (08/05/1998)   Pneumococcal vaccine deferral: Not indicated  (07/12/2009)   Pneumococcal vaccine due: None    H. zoster vaccine: Not documented  Colorectal Screening   Hemoccult: Not documented   Hemoccult due: Not Indicated    Colonoscopy: Done.  (02/03/2005)   Colonoscopy due: 02/03/2010  Other Screening   PSA: 6.18  (03/25/2009)   PSA due due: 06/21/2009   Smoking status: never  (08/10/2010)  Diabetes Mellitus   HgbA1C: 6.4  (07/18/2010)   Hemoglobin A1C due: 04/13/2009    Eye exam: No diabetic retinopathy.   Cataract. OU Intraocular pressure OD:     16 Intraocular pressure OS:     15   (10/06/2009)   Diabetic eye exam action/deferral: Deferred  (07/12/2009)    Foot exam: yes  (09/26/2010)   High risk foot: Not documented   Foot care education: completed   (01/11/2009)   Foot exam due: 07/01/2009    Urine microalbumin/creatinine ratio: Not documented   Urine microalbumin/cr due: 07/01/2009    Diabetes flowsheet reviewed?: Yes   Progress toward A1C goal: At goal  Lipids   Total Cholesterol: 125  (08/09/2009)   LDL: 58  (08/09/2009)   LDL Direct: 72  (01/11/2009)   HDL: 51  (08/09/2009)   Triglycerides: 80  (08/09/2009)    SGOT (AST): 34  (08/09/2009)   SGPT (ALT): 19  (08/09/2009)   Alkaline phosphatase: 72  (08/09/2009)   Total bilirubin: 0.8  (08/09/2009)  Self-Management Support :   Personal Goals (by the next clinic visit) :     Personal A1C goal: 7  (07/12/2009)     Personal blood pressure goal: 130/80  (07/12/2009)     Personal LDL goal: 100  (07/12/2009)    Diabetes self-management support: Education handout  (07/12/2009)   Last diabetes self-management training by diabetes educator: completed    Lipid self-management support: Education handout  (07/12/2009)

## 2010-12-08 ENCOUNTER — Encounter: Payer: Self-pay | Admitting: Family Medicine

## 2010-12-08 NOTE — Consult Note (Signed)
Summary: [facility]   Imported By: De Nurse 04/05/2010 16:05:27  _____________________________________________________________________  External Attachment:    Type:   Image     Comment:   External Document

## 2010-12-08 NOTE — Miscellaneous (Signed)
Summary: Procedure Consent  Procedure Consent   Imported By: Clydell Hakim 08/16/2010 09:13:42  _____________________________________________________________________  External Attachment:    Type:   Image     Comment:   External Document

## 2010-12-11 ENCOUNTER — Encounter: Payer: Self-pay | Admitting: *Deleted

## 2010-12-12 ENCOUNTER — Encounter: Payer: Self-pay | Admitting: *Deleted

## 2010-12-13 ENCOUNTER — Encounter: Payer: Self-pay | Admitting: Family Medicine

## 2010-12-13 NOTE — Letter (Addendum)
Summary: Results Follow-up Letter  [facility]  [address]   [place], [place] [address]   Phone: [phone]  Fax: [phone]    12/08/2010  [address] [place], [place]  [address]  [place]  Dear Mr. [facility],   The following are the results of your recent test(s):  Patient: Peter [name] Your results are good. The elevated fasting glucose reflects your mild diabetes.  You should limit starches and sugars and increase exercise to improve your body's insulin function.  Tests: (1) CMP with Estimated GFR (2402)   Sodium                    139 mEq/L                   135-145   Potassium                 4.1 mEq/L                   3.5-5.3   Chloride                  103 mEq/L                   96-112   CO2                       24 mEq/L                    19-32   Glucose              [H]  117 mg/dL                   57-84   BUN                       12 mg/dL                    6-96   Creatinine                0.96 mg/dL                  0.40-1.50   Bilirubin, Total          0.8 mg/dL                   2.9-5.2   Alkaline Phosphatase      73 U/L                      39-117   AST/SGOT                  29 U/L                      0-37   ALT/SGPT                  19 U/L                      0-53   Total Protein             6.9 g/dL                    8.4-1.3   Albumin  3.8 g/dL                    1.6-1.0   Calcium                   8.7 mg/dL                   9.6-04.5 ! Est GFR, African American                             >60 mL/min                  >60 ! Est GFR, NonAfrican American                             >60 mL/min                  >60  Tests: (2) Lipid Profile (40981)   Cholesterol               123 mg/dL                   1-914     ATP III Classification:           < 200        mg/dL        Desirable          200 - 239     mg/dL        Borderline High          >= 240        mg/dL        High         Triglyceride              58  mg/dL                    <782   HDL Cholesterol           47 mg/dL                    >95   Total Chol/HDL Ratio      2.6 Ratio  VLDL Cholesterol (Calc)                             12 mg/dL                    6-21  LDL Cholesterol (Calc)                             64 mg/dL                    3-08           Total Cholesterol/HDL Ratio:CHD Risk                            Coronary Heart Disease Risk Table                                            Men       Women  1/2 Average Risk              3.4        3.3                  Average Risk              5.0        4.4              2 X Average Risk              9.6        7.1              3 X Average Risk             23.4       11.0     Use the calculated Patient Ratio above and the CHD Risk table      to determine the patient's CHD Risk.     ATP III Classification (LDL):           < 100        mg/dL         Optimal   Document Creation Date: 12/08/2010 3:01 AM  Peter Dauer MD [facility]            Appended Document: Results Follow-up Letter mailed

## 2010-12-13 NOTE — Assessment & Plan Note (Signed)
Summary: memory test/per Sayla Golonka/eo       Vital Signs:  Patient profile:   77 year old male Height:      66 inches Weight:      214 pounds BMI:     34.67 Pulse rate:   73 / minute BP sitting:   128 / 80  (right arm)  Vitals Entered By: Arlyss Repress CMA, (December 07, 2010 11:07 AM)  Primary Care Provider:  Zachery Dauer MD  CC:  mmse and leg swelling.  History of Present Illness: Milinda Antis MD, Resident Patient here fore MMSE , feels like he has been forgetting things more often  LE- noted increased edema started 14days ago, fell coming out of a restaurant and felt he strained his ankle. No SOB, no chest pain, getting up at night to urinate more than usual, sleeps elevated on 1-2 pillows in the bed   [facility] MMSE-     23/30  Current Medications (verified): 1)  Metformin Hcl 500 Mg Xr24h-Tab (Metformin Hcl) .... Take 1 Tablet Daily Twice Daily 2)  Simvastatin 20 Mg Tabs (Simvastatin) .... Take One Tablet Daily At Bedtime 3)  Aspir-Low 81 Mg Tbec (Aspirin) .... Take One Tablet Daily 4)  Vitamin D3 1000 Unit Caps (Cholecalciferol) .... Take 2 Caps Daily 5)  Coenzyme Q10 100 Mg Caps (Coenzyme Q10) .... Take One Cap Daily 6)  Lisinopril 2.5 Mg Tabs (Lisinopril) .... Take One Tablet Daily 7)  Furosemide 20 Mg Tabs (Furosemide) .Marland Kitchen.. 1 By Mouth Daily For Leg Swelling  Allergies (verified): No Known Drug Allergies  Physical Exam  General:   alert,appropriate and cooperative throughout examination, obese vitals noted  Neck:  no JVD, or HJR Lungs:  CTAB Heart:  RRR, no murmur no gallop Extremities:  3+ edema Psych:  Oriented X3, memory intact for recent and remote, normally interactive, good eye contact, and not depressed appearing.  Circumlocutious in some responses  Diabetes Management Exam:    Foot Exam (with socks and/or shoes not present):       Sensory-Pinprick/Light touch:          Left medial foot (L-4): normal          Left dorsal foot (L-5): normal          Left  lateral foot (S-1): normal          Right medial foot (L-4): normal          Right dorsal foot (L-5): normal          Right lateral foot (S-1): normal       Sensory-Monofilament:          Left foot: normal          Right foot: normal       Inspection:          Left foot: normal          Right foot: normal       Nails:          Left foot: thickened          Right foot: thickened   Impression & Recommendations:  Problem # 1:  MEMORY LOSS (ICD-780.93)  [place] MMSE 30/30 , [place] 23/30 Possible early dementia in a well educated patient. Repeat testing in one year.   Orders: [facility]- Est  Level 4 ([address])  Problem # 2:  DEPENDENT EDEMA, LEGS, BILATERAL (ICD-782.3) Assessment: New  Unclear cause of acute decompensation in lower ext, no compromise regarding cardiorespiratory status, check labs,  start lasix, will refer back to cardiologist to have 2D echo done with  Pacer interrogation His updated medication list for this problem includes:    Furosemide 20 Mg Tabs (Furosemide) .Marland Kitchen... 1 by mouth daily for leg swelling  Orders: Cardiology Referral (Cardiology) [facility]- Est  Level 4 ([address])  Problem # 3:  DIABETES MELLITUS (ICD-250.00) Assessment: Unchanged  His updated medication list for this problem includes:    Metformin Hcl 500 Mg Xr24h-tab (Metformin hcl) .Marland Kitchen... Take 1 tablet daily twice daily    Aspir-low 81 Mg Tbec (Aspirin) .Marland Kitchen... Take one tablet daily    Lisinopril 2.5 Mg Tabs (Lisinopril) .Marland Kitchen... Take one tablet daily  Orders: A1C-[facility] ([address]) Comp Met-[facility] ([address]08-65784) [facility]- Est  Level 4 ([address])  Complete Medication List: 1)  Metformin Hcl 500 Mg Xr24h-tab (Metformin hcl) .... Take 1 tablet daily twice daily 2)  Simvastatin 20 Mg Tabs (Simvastatin) .... Take one tablet daily at bedtime 3)  Aspir-low 81 Mg Tbec (Aspirin) .... Take one tablet daily 4)  Vitamin D3 1000 Unit Caps (Cholecalciferol) .... Take 2 caps daily 5)  Coenzyme Q10 100 Mg Caps (Coenzyme q10) .... Take one  cap daily 6)  Lisinopril 2.5 Mg Tabs (Lisinopril) .... Take one tablet daily 7)  Furosemide 20 Mg Tabs (Furosemide) .Marland Kitchen.. 1 by mouth daily for leg swelling  Other Orders: Lipid-[facility] ([address]-32440)  Patient Instructions: 1)  Start the water pill (Furosemide) for your leg swelling 2)  Please schedule with your cardiologist- You need a 2D Echocardiogram done 3)  Next visit in 1 month 4)  We will send a letter lab results  Prescriptions: FUROSEMIDE 20 MG TABS (FUROSEMIDE) 1 by mouth daily for leg swelling  #30 x 1   Entered and Authorized by:   Zachery Dauer MD   Signed by:   Zachery Dauer MD on 12/07/2010   Method used:   [facility]        [facility]       [address]       [place], [place]  [address]       Ph: [phone]       Fax: [phone]   RxID:   [id number]    Orders Added: 1)  A1C-[facility] [83036] 2)  Lipid-[facility] [80061-22930] 3)  Comp Met-[facility] [30160-10932] 4)  Cardiology Referral [Cardiology] 5)  [facility]- Est  Level 4 [[address]]     Geriatric Assessment:  Mental Status Exam: (value/max value)    Orientation to Time: 5/5    Orientation to Place: 5/5    Registration: 3/3    Attention/Calculation: 5/5    Recall: 3/3    Language-name 2 objects: 2/2    Language-repeat: 1/1    Language-follow 3-step command: 3/3    Language-read and follow direction: 1/1    Write a sentence: 1/1    Copy design: 1/1 MSE Total score: 30/30  Clock Drawing Score: 4/4  Laboratory Results   Blood Tests   Date/Time Received: December 07, 2010 11:38 AM  Date/Time Reported: December 07, 2010 3:35 PM   HGBA1C: 6.4%   (Normal Range: Non-Diabetic - 3-6%   Control Diabetic - 6-8%)  Comments: ...............test performed by......Marland KitchenBonnie A. [place], MLS (ASCP)cm       Prevention & Chronic Care Immunizations   Influenza vaccine: given  (09/07/2008)   Influenza vaccine due: 07/07/2011    Tetanus booster: 06/05/2004: Done.   Tetanus  booster due: 06/05/2014    Pneumococcal  vaccine: Done.  (08/05/1998)   Pneumococcal vaccine deferral: Not indicated  (07/12/2009)   Pneumococcal vaccine due: None    H. zoster vaccine: Not documented  Colorectal Screening   Hemoccult: Not documented   Hemoccult due: Not Indicated    Colonoscopy: Done.  (02/03/2005)   Colonoscopy due: 02/03/2010  Other Screening   PSA: 6.18  (03/25/2009)   PSA due due: 06/21/2009   Smoking status: never  (08/10/2010)  Diabetes Mellitus   HgbA1C: 6.4  (12/07/2010)   Hemoglobin A1C due: 04/13/2009    Eye exam: No diabetic retinopathy.   Cataract. OU Intraocular pressure OD:     16 Intraocular pressure OS:     15   (10/06/2009)   Diabetic eye exam action/deferral: Deferred  (07/12/2009)    Foot exam: yes  (12/07/2010)   High risk foot: Not documented   Foot care education: completed  (01/11/2009)   Foot exam due: 07/01/2009    Urine microalbumin/creatinine ratio: Not documented   Urine microalbumin/cr due: 07/01/2009    Diabetes flowsheet reviewed?: Yes   Progress toward A1C goal: At goal  Lipids   Total Cholesterol: 125  (08/09/2009)   LDL: 58  (08/09/2009)   LDL Direct: 72  (01/11/2009)   HDL: 51  (08/09/2009)   Triglycerides: 80  (08/09/2009)    SGOT (AST): 34  (08/09/2009)   SGPT (ALT): 19  (08/09/2009) CMP ordered    Alkaline phosphatase: 72  (08/09/2009)   Total bilirubin: 0.8  (08/09/2009)    Lipid flowsheet reviewed?: Yes   Progress toward LDL goal: Unchanged  Self-Management Support :   Personal Goals (by the next clinic visit) :     Personal A1C goal: 7  (07/12/2009)     Personal blood pressure goal: 130/80  (07/12/2009)     Personal LDL goal: 100  (07/12/2009)    Diabetes self-management support: Education handout  (07/12/2009)   Last diabetes self-management training by diabetes educator: completed    Lipid self-management support: Education handout  (07/12/2009)

## 2010-12-13 NOTE — Miscellaneous (Signed)
Summary: Metformin refill  Clinical Lists Changes  Medications: Rx of METFORMIN HCL 500 MG XR24H-TAB (METFORMIN HCL) Take 1 tablet daily twice daily;  #60 Each x 3;  Signed;  Entered by: Zachery Dauer MD;  Authorized by: Zachery Dauer MD;  Method used: Electronically to [facility] [address] [place], [place], [place]  [address], Ph: [phone], Fax: [phone]    Prescriptions: METFORMIN HCL 500 MG XR24H-TAB (METFORMIN HCL) Take 1 tablet daily twice daily  #60 Each x 3   Entered and Authorized by:   Zachery Dauer MD   Signed by:   Zachery Dauer MD on 12/06/2010   Method used:   [facility]ron[facility] to        [facility]       [address]       [place], [place]  [address]       Ph: [phone]       Fax: [phone]   RxID:   [id number]

## 2010-12-13 NOTE — Letter (Addendum)
Summary: *Referral Letter  [facility]  [address]   [place], [place] [address]   Phone: [phone]  Fax: [phone]    12/07/2010  Thank you for your care of my patient:  Peter [name] [address] [place], [place]  [address]  Phone: [phone]  Reason for Referral: Recent onset of bilateral pedal edema without other signs of right  or left sided heart failure  Procedures Requested: Consideration of follow-up echo cardiogram. None done recently that I'm aware of. Diastolic dysfunction?  Current Medical Problems: 1)  DEPENDENT EDEMA, LEGS, BILATERAL (ICD-782.3) 2)  DIABETES MELLITUS (ICD-250.00) 3)  BASAL CELL CARCINOMA OF SCALP AND SKIN OF NECK (ICD-173.41) 4)  NEOPLASM, SKIN, UNCERTAIN BEHAVIOR (ICD-238.2) 5)  ANEMIA (ICD-285.9) 6)  PACEMAKER, PERMANENT (ICD-V45.01) 7)  TINEA PEDIS (ICD-110.4) 8)  HYPERTROPHY OF BREAST (ICD-611.1) 9)  MEMORY LOSS (ICD-780.93) 10)  AODM (ICD-250.00) 11)  PERIUMBILICAL HERNIA (ICD-553.1) 12)  DECREASED HEARING, RIGHT EAR (ICD-389.9) 13)  VENOUS INSUFFICIENCY, CHRONIC (ICD-459.81) 14)  PSORIASIS (ICD-696.1) 15)  PROSTATE CANCER (ICD-185) 16)  OSTEOARTHRITIS OF SPINE, NOS (ICD-721.90) 17)  OBESITY, NOS (ICD-278.00) 18)  MACULAR DEGENERATION (ICD-362.50) 19)  HYPERLIPIDEMIA (ICD-272.4) 20)  GASTROESOPHAGEAL REFLUX, NO ESOPHAGITIS (ICD-530.81) 21)  DIVERTICULOSIS OF COLON (ICD-562.10) 22)  HYPERTROPHY PROSTATE W/O UR OBST & OTH LUTS (ICD-600.00) 23)  APNEA, SLEEP (ICD-780.57)   Current Medications: 1)  METFORMIN HCL 500 MG XR24H-TAB (METFORMIN HCL) Take 1 tablet daily twice daily 2)  SIMVASTATIN 20 MG TABS (SIMVASTATIN) Take one tablet daily at bedtime 3)  ASPIR-LOW 81 MG TBEC (ASPIRIN) Take one tablet daily 4)  VITAMIN D3 1000 UNIT CAPS (CHOLECALCIFEROL) Take 2 caps daily 5)  COENZYME Q10 100 MG CAPS (COENZYME Q10) Take one cap daily 6)  LISINOPRIL 2.5 MG TABS (LISINOPRIL) Take one tablet daily 7)   FUROSEMIDE 20 MG TABS (FUROSEMIDE) 1 by mouth daily for leg swelling   Past Medical History: 1)  Cardiac septal defect closed self during childhood 2)   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 Polyps - 03/18/2002 3)  Colonoscopy - polyp benign - 12/06/2002, Colonoscopy - polyp benign adenoma - 03/03/2005 4)  Adenosine Cardiolite Neg EF 71% - 06/10/2002 5)  MMSE 29/30 - 10/14/2002 6)  TSH nl - 03/18/2003 7)  Echo Mild AI,TR, MR EF70% - 09/04/2005  8)  C-spine Xray-narrow foramin C3-5 - 09/05/2006 9)  OSA declines CPAP 10)  S/p PPM for Mobitz II AV block   Pertinent Labs:  Patient: Peter [name]  Tests: (1) CMP with Estimated GFR (2402)   Sodium                    139 mEq/L                   135-145   Potassium                 4.1 mEq/L                   3.5-5.3   Chloride                  103 mEq/L                   96-112   CO2                       24 mEq/L  19-32   Glucose              [H]  117 mg/dL                   54-09   BUN                       12 mg/dL                    8-11   Creatinine                0.96 mg/dL                  0.40-1.50   Bilirubin, Total          0.8 mg/dL                   9.1-4.7   Alkaline Phosphatase      73 U/L                      39-117   AST/SGOT                  29 U/L                      0-37   ALT/SGPT                  19 U/L                      0-53   Total Protein             6.9 g/dL                    8.2-9.5   Albumin                   3.8 g/dL                    6.2-1.3   Calcium                   8.7 mg/dL                   0.8-65.7 ! Est GFR, African American                             >60 mL/min                  >60 ! Est GFR, NonAfrican American                             >60 mL/min                  >60  Tests: (2) Lipid Profile ([address])   Cholesterol               123 mg/dL                   2-952     ATP III Classification:           < 200        mg/dL         Desirable          200 -  239     mg/dL        Borderline High          >= 240        mg/dL        High         Triglyceride              58 mg/dL                    <756   HDL Cholesterol           47 mg/dL                    >43   Total Chol/HDL Ratio      2.6 Ratio  VLDL Cholesterol (Calc)                             12 mg/dL                    3-29  LDL Cholesterol (Calc)                             64 mg/dL                    5-18        Document Creation Date: 12/08/2010 3:01 AM   Thank you again for agreeing to see our patient; please contact us if you have any further questions or need additional information.  Sincerely,  Zachery Dauer MD

## 2010-12-21 NOTE — Miscellaneous (Signed)
Summary: cardiology appt  Clinical Lists Changes attempted to call pt, mailbox full. he has appt with Dr Coosa Desanctis cardiology 12/18/10 @ 3pm..Marland KitchenMarland KitchenMolly Maduro [facility] CMA  December 11, 2010 3:03 PM unable to reach pt. no answer and mailbox full. mailed letter with info.Arlyss Repress CMA,  December 12, 2010 3:38 PM

## 2010-12-21 NOTE — Letter (Signed)
Summary: Results Follow-up Letter  [facility]  [address]   [place], [place] [address]   Phone: [phone]  Fax: [phone]    12/13/2010  [address] [place], [place]  [address]  [place]  Dear [facility],   The following are the results of your recent test(s): Patient: Peter [name] [facility]  Tests: (1) CMP with Estimated GFR (2402)   Sodium                    139 mEq/L                   135-145   Potassium                 4.1 mEq/L                   3.5-5.3   Chloride                  103 mEq/L                   96-112   CO2                       24 mEq/L                    19-32   Glucose              [H]  117 mg/dL                   57-84   BUN                       12 mg/dL                    6-96   Creatinine                0.96 mg/dL                  0.40-1.50   Bilirubin, Total          0.8 mg/dL                   2.9-5.2   Alkaline Phosphatase      73 U/L                      39-117   AST/SGOT                  29 U/L                      0-37   ALT/SGPT                  19 U/L                      0-53   Total Protein             6.9 g/dL                    8.4-1.3   Albumin                   3.8 g/dL                    2.4-4.0   Calcium  8.7 mg/dL                   9.5-63.8 ! Est GFR, African American                             >60 mL/min                  >60                  Your blood sugar when fasting is in the prediabetic level. Diabetes is diagnosed when the fasting glucose is higher than 125. Your kidney function is good.   Tests: (2) Lipid Profile ([address])   Cholesterol               123 mg/dL                   3-295     ATP III Classification:           < 200        mg/dL        Desirable          200 - 239     mg/dL        Borderline High          >= 240        mg/dL        High         Triglyceride              58 mg/dL                    <188   HDL Cholesterol           47 mg/dL                    >41   Total  Chol/HDL Ratio      2.6 Ratio  VLDL Cholesterol (Calc)                             12 mg/dL                    6-60  LDL Cholesterol (Calc)                             64 mg/dL                    6-30        Your cholesterol is in the low risk range.  Sincerely,  Zachery Dauer MD [facility]

## 2010-12-21 NOTE — Letter (Signed)
Summary: Generic Letter  [facility]  [address]   [place], [place] [address]   Phone: [phone]  Fax: [phone]    12/12/2010  [facility] [address] [place], [place]  [address]  [place]  Dear Mr. [facility],  We were unable to reach by phone.   We have scheduled an appointment for you with   Dr.Turner (Cardiologist) 12-18-10 at 3pm. Ph# [phone]           Sincerely,   Arlyss Repress CMA,

## 2011-01-15 ENCOUNTER — Encounter: Payer: Self-pay | Admitting: Home Health Services

## 2011-01-17 LAB — POCT CARDIAC MARKERS
Myoglobin, poc: 84.9 ng/mL (ref 12–200)
Troponin i, poc: <0.05 ng/mL (ref 0.00–0.09)
Troponin i, poc: <0.05 ng/mL (ref 0.00–0.09)

## 2011-01-17 LAB — CBC
MCH: 32.7 pg (ref 26.0–34.0)
MCHC: 35.1 g/dL (ref 30.0–36.0)
MCV: 93.2 fL (ref 78.0–100.0)
Platelets: 132 10*3/uL — ABNORMAL LOW (ref 150–400)
RDW: 13.5 % (ref 11.5–15.5)
WBC: 4 10*3/uL (ref 4.0–10.5)

## 2011-01-17 LAB — DIFFERENTIAL
Basophils Absolute: 0 10*3/uL (ref 0.0–0.1)
Basophils Relative: 1 % (ref 0–1)
Eosinophils Absolute: 0.2 10*3/uL (ref 0.0–0.7)
Eosinophils Relative: 6 % — ABNORMAL HIGH (ref 0–5)

## 2011-01-17 LAB — POCT I-STAT, CHEM 8
Creatinine, Ser: 1.2 mg/dL (ref 0.4–1.5)
Glucose, Bld: 222 mg/dL — ABNORMAL HIGH (ref 70–99)
Hemoglobin: 10.5 g/dL — ABNORMAL LOW (ref 13.0–17.0)
TCO2: 25 mmol/L (ref 0–100)

## 2011-01-17 LAB — PROTIME-INR: Prothrombin Time: 14.7 seconds (ref 11.6–15.2)

## 2011-01-19 LAB — GLUCOSE, CAPILLARY
Glucose-Capillary: 110 mg/dL — ABNORMAL HIGH (ref 70–99)
Glucose-Capillary: 171 mg/dL — ABNORMAL HIGH (ref 70–99)
Glucose-Capillary: 177 mg/dL — ABNORMAL HIGH (ref 70–99)

## 2011-01-19 LAB — PROTIME-INR
INR: 1.15 (ref 0.00–1.49)
Prothrombin Time: 14.6 seconds (ref 11.6–15.2)

## 2011-01-19 LAB — COMPREHENSIVE METABOLIC PANEL
Albumin: 3.8 g/dL (ref 3.5–5.2)
BUN: 8 mg/dL (ref 6–23)
Calcium: 9 mg/dL (ref 8.4–10.5)
Glucose, Bld: 116 mg/dL — ABNORMAL HIGH (ref 70–99)
Sodium: 139 mEq/L (ref 135–145)
Total Protein: 7.9 g/dL (ref 6.0–8.3)

## 2011-01-19 LAB — CBC
HCT: 33.7 % — ABNORMAL LOW (ref 39.0–52.0)
MCHC: 35 g/dL (ref 30.0–36.0)
MCV: 92.6 fL (ref 78.0–100.0)
Platelets: 128 10*3/uL — ABNORMAL LOW (ref 150–400)
RDW: 13.7 % (ref 11.5–15.5)
WBC: 4.3 10*3/uL (ref 4.0–10.5)

## 2011-03-21 ENCOUNTER — Encounter: Payer: Self-pay | Admitting: Family Medicine

## 2011-03-21 DIAGNOSIS — H269 Unspecified cataract: Secondary | ICD-10-CM

## 2011-03-21 NOTE — Progress Notes (Unsigned)
  Subjective:    Patient ID: Peter [name], male    DOB: 1919/05/22, 77 y.o.   MRN: [id number]  HPIReport of othalmologic exam by Dr Dione Booze. No diabetic retinopathy, but macular degeneration and cataracts will be removed when he can arrange transportation    Review of Systems     Objective:   Physical Exam        Assessment & Plan:

## 2011-03-23 NOTE — H&P (Signed)
NAME:  [name], Peter NO.:  [id number]   MEDICAL RECORD NO.:  [id number]          PATIENT TYPE:  INP   LOCATION:  5003                         FACILITY:  [facility]   PHYSICIAN:  Jefry H. Pollyann Kennedy, MD     DATE OF BIRTH:  [birth year]   DATE OF ADMISSION:  12/10/2006  DATE OF DISCHARGE:                              HISTORY & PHYSICAL   REASON FOR ADMISSION:  Intractable epistaxis.   HISTORY:  This is an 77 year old gentleman who has been treated on  multiple occasions in the past 2 weeks for recurrent epistaxis.  He was  treated by Dr. Annalee Genta in our office on Monday, where he underwent  cauterization on the right side and earlier this morning had a packing  place in the emergency department.  He continues to bleed and was  instructed to come back to the emergency department for my evaluation.  He has no past medical history.  Is on no blood thinners.  Blood count  this morning revealed a hemoglobin of 11.2 and a PTT of 40, which is  slightly elevated.  PT was normal.   EXAMINATION:  Healthy-appearing elderly gentleman with active mild  bleeding from the right side of the nose.  The inflatable packing was  removed.  Topical Afrin was applied.  A large clot was suctioned from  the right nasal cavity.  The cautery artifact from the right anterior  septum was actively bleeding in the posterior aspect.  This was  anesthetized with Xylocaine/epinephrine and recauterized with silver  nitrate until there was cessation of the bleeding.  A large Merocel pack  was inserted without difficulty and inflated with the local anesthetic  solution.  There was no further bleeding.   IMPRESSION:  Intractable epistaxis, status post cauterization and  packing.   PLAN:  Is to admit to the hospital for observation to look for continued  bleeding and to intervene if necessary.  We will treat with amoxicillin  500 mg t.i.d. until the packing is removed.  If he does well, will be  discharged  tomorrow and will follow up on Monday in the office for  packing removal.  If there is any further bleeding, then I will be  available to evaluate him further.      Jefry H. Pollyann Kennedy, MD  Electronically Signed     JHR/MEDQ  D:  12/10/2006  T:  12/11/2006  Job:  [id number]

## 2011-03-23 NOTE — Op Note (Signed)
NAME:  BARKLEY, KRATOCHVIL NO.:  [id number]   MEDICAL RECORD NO.:  [id number]          PATIENT TYPE:  INP   LOCATION:  6526                         FACILITY:  [facility]   PHYSICIAN:  Francisca December, M.D.  DATE OF BIRTH:  December 31, 1918   DATE OF PROCEDURE:  01/18/2005  DATE OF DISCHARGE:                                 OPERATIVE REPORT   PROCEDURE PERFORMED:  1.  Insertion of dual-chamber permanent transvenous pacemaker.  2.  Left subclavian venogram.   SURGEON:  Francisca December, M.D.   INDICATIONS:  Peter [name] is an 77 year old man with syncope.  Holter monitoring has shown a second-degree heart block with intermittent  third-degree heart block.  He was hospitalized and has had several episodes  of asystole, 7-10 seconds.  This was during complete heart block.  Atrial  activity continued.  He is brought to the catheterization laboratory now at  this time for insertion of a dual-chamber permanent transvenous pacemaker.   PROCEDURAL NOTE:  The patient was brought to cardiac catheterization  laboratory in fasting state.  The left prepectoral region was prepped and  draped in usual sterile fashion.  Local anesthesia was obtained with the  infiltration of 1% lidocaine with epinephrine throughout.  A left subclavian  venogram was then performed with the peripheral injection of 20 mL of  Omnipaque.  A digital cine angiogram in the AP projection was obtained and  road-mapped to guide future left subclavian puncture.  The venogram did  document the left subclavian vein to be widely patent and coursing in a  normal fashion over the anterior surface of the first rib and the middle  third of the clavicle.  There was no evidence for persistence of the left  superior vena cava.  A 6- to 7-cm incision was then made in the  deltopectoral groove and this was carried down by sharp dissection and  electrocautery to the prepectoral fascia; there a plane was lifted and a  pocket  formed inferiorly and medially utilizing electrocautery and blunt  dissection.  The pocket was then packed with a 1% kanamycin-soaked gauze.  Two separate left subclavian punctures were then performed with an 18-gauge  thin-walled needle through which was passed a 0.038-inch tight J guidewire;  over the initial guidewire, a 7-French tear-away sheath and dilator were  advanced.  The dilator and wire were removed and the ventricular lead was  advanced to the level of the right atrium.  The sheath was torn away.  Using  standard technique and fluoroscopic landmarks, the lead was manipulated into  the right ventricular apex.  There, excellent pacing parameters were  obtained, as will be noted below.  The lead was tested for diaphragmatic  pacing at 10 volts and none was found.  The lead was then sutured into place  using 3 separate 0 silk ligatures.  Over the remaining guidewire a 9-French  tearaway sheath and dilator were advanced.  The dilator was removed and the  wire was allowed to remain in place.  The atrial lead was advanced to the  level of the right atrium and the  sheath was torn away.  A figure-of-eight  hemostasis suture was then placed around the entry site of lead in the  pectoralis muscle to control backbleeding.  Again, using standard technique  and fluoroscopic landmarks, the lead was manipulated into the right atrial  appendage.  There, excellent pacing parameters were obtained after  deployment of the anchoring screw.  These are reported below.  The lead was  tested for diaphragmatic pacing at 10 volts and none was found.  The lead  was then sutured into place using 3 separate silk ligatures.  The remaining  guidewire and the kanamycin-soaked gauze were removed from the pocket and  the pocket was copiously irrigated using 1% kanamycin solution.  The leads  were then connected to the pacing generator, identifying each by its serial  number and placing each in the appropriate  receptacle.  Each lead was  tightened into place and tested for security.  The leads were then wound  beneath the pacing generator and the generator was placed in the pocket.  An  anchoring suture was applied.  The wound was then closed using 2-0 Vicryl in  a running fashion, 2 layers for the subcutaneous tissue.  The skin was  approximated using 4-0 Vicryl in a running subcuticular fashion.  Steri-  Strips and a sterile dressing were applied and the patient was transported  to the recovery area in stable condition in an A-sense/V-pace mode.   EQUIPMENT DATA:  The pacing generator is a [employer] Identity ADX XLDR, model  number [id number], serial number [id number].  The atrial lead is a St. Jude model  U9629235, serial number [id number].  The ventricular lead is a St. Jude model  [id number], serial number [id number].   PACING DATA:  The ventricular lead detected a 9-mV R wave.  The pacing  threshold was 0.6 volts at 0.5 milliseconds and the impedance was 634 ohms.  The atrial lead detected a 3.8-mV P-wave.  The pacing threshold was 1 volt  to 0.5 millisecond and impedance was 507 ohms.      JHE/MEDQ  D:  01/18/2005  T:  01/19/2005  Job:  [id number]   cc:   Tasia Catchings, M.D.  [address]  [place]  [place] [address]  Fax: [phone]

## 2011-03-23 NOTE — Op Note (Signed)
Peter [name], Peter [name]                     ACCOUNT NO.:  [id number]   MEDICAL RECORD NO.:  [id number]                   PATIENT TYPE:  INP   LOCATION:  0002                                 FACILITY:  [facility]   PHYSICIAN:  Currie Paris, M.D.           DATE OF BIRTH:  Mar 16, 1919   DATE OF PROCEDURE:  07/10/2002  DATE OF DISCHARGE:                                 OPERATIVE REPORT   OFFICE MEDICAL RECORD NUMBER:  [id number]   PREOPERATIVE DIAGNOSIS:  Multiple cecal polyps, not removable by  colonoscopy.   POSTOPERATIVE DIAGNOSIS:  Multiple cecal polyps, not removable by  colonoscopy.   OPERATION:  Right colectomy.   SURGEON:  Currie Paris, M.D.   ASSISTANT:  Zigmund Daniel, M.D.   ANESTHESIA:  General endotracheal.   CLINICAL HISTORY:  This patient is an 77 year old, who grows multiple  polyps.  He had several removed by Dr. Sherin Quarry a few months ago, but he had  a large sessile one in the cecum that was unable to be removed  colonoscopically, and he was recommended for surgical treatment.  We had a  lengthy discussion about doing a subtotal colectomy on his to remove all of  the polyp-bearing areas within the colon, so all he would have left would be  rectum to be monitored.  We also discussed just doing a right colectomy  which was the area with the current polyp that was not removable by  colonoscopy.  The patient really wanted the least intervention possible and  elected to proceed to right colectomy with the understanding that he knew  that he would need to be closely monitored by Dr. Sherin Quarry for additional  polyps at least annually if not every six months.   With that in mind, we scheduled right colectomy.   DESCRIPTION OF PROCEDURE:  The patient was seen preoperatively and had no  further questions and was ready to proceed to surgery.  He was taken to the  operating room and after satisfactory general endotracheal anesthesia had  been obtained, a  Foley catheter was placed and the abdomen prepped and  draped.  Transverse right upper quadrant incision made just above the  umbilicus and the peritoneal cavity entered.  Gentle palpation revealed no  gross abnormalities.  A wound protector and Balfour were placed.  The right  colon was mobilized as was hepatic flexure so that I had the entire right  colon up in the wound, going around to and through the hepatic flexure.  I  selected a site just proximal to the ileocecal valve to divide the small  bowel and a site in the mid right transverse to divide the colon.  I divided  the omentum here, cleaned it off, so I had a long area of right colon to  work with and made a small windmill in the mesentery.  I then made a window  in the small bowel mesentery, divided the small bowel  down and staying close  to the mesentery and then worked around the right colon and down from the  transverse down to meet in the middle of the mesentery where the right colon  vessels came in.  Bleeders were either coagulated, tied, or double-tied with  some suture ligatures as indicated.   Once the colon was completely freed up, we checked the mesentery to make  sure everything was dry.  The duodenum was well posterior, and we did not  get into the area of the ureter.  I thought I was well beyond any polyps and  actually had a fair amount of the right transverse colon ready to be  removed.   I tacked the small bowel antimesenteric border to the colon antimesenteric  border.  This was to be end of the staple line.  I opened the small bowel  and colon and the mesenteric border in the area to be resected, inserted the  75 GIA and fired it.  The staple line appeared to be dry.  Common defect was  closed with a single application going across all layers with the TA 60.  This produced a nice patent anastomosis.  There were a couple of bleeding  points on the staple line that had to be sutured.  The mesenteric defect  was  then closed with 2-0 silk sutures.   Gloves and instruments were changed.  The abdomen was irrigated and a check  for hemostasis made, and everything appeared to be dry.  The abdomen at the  anastomosis appeared to be viable, patent, and no other abnormalities noted.   The abdomen was then closed with a running 0 PDS on the posterior sheath, a  running #1 PDS on the interior sheath, and staples on the skin.  The wound  was irrigated through each layers of closure.   The patient tolerated the procedure well.  There were no operative  complications.  All counts were correct.                                               Currie Paris, M.D.    CJS/MEDQ  D:  07/10/2002  T:  07/10/2002  Job:  [id number]   cc:   Deniece Portela A. Sheffield Slider, M.D.  [address] [place]  [place] [address]  Fax: [phone]   Genene Churn. Sherin Quarry, M.D.  [address]  [place]  [place] [address]  Fax: [phone]

## 2011-03-23 NOTE — Discharge Summary (Signed)
NAME:  Peter [name], Peter [name]                      ACCOUNT NO.:  [id number]   MEDICAL RECORD NO.:  [id number]                   PATIENT TYPE:  INP   LOCATION:  0350                                 FACILITY:  [facility]   PHYSICIAN:  Currie Paris, M.D.           DATE OF BIRTH:  May 15, 1919   DATE OF ADMISSION:  07/10/2002  DATE OF DISCHARGE:  07/17/2002                                 DISCHARGE SUMMARY   FINAL DIAGNOSES:  1. Multiple colon polyps ascending colon.  2. Sleep apnea.  3. History of prostate cancer.  4. Exogenous obesity.  5. Chronic obstructive pulmonary disease.   MEDICAL HISTORY:  The patient is 77 year old man who has had multiple colon  polyps over the past and had an ascending colon polyp that was sessile and  really in the cecum and too large to get out.  Alternatives discussed with  the patient was a right colectomy which would take care of the immediate  problem and a subtotal colectomy which was felt to be indicated because of  his history of multiple colon polyps all occurring fairly frequently and the  fact that he was not always in for followup colonoscopies.  The patient,  however, felt very strongly that he wished to delay surgical intervention if  possible and promised that he would followup with Dr. Sherin Quarry frequently  for colonoscopies.   HOSPITAL COURSE:  The patient was admitted and taken to the operating room  on September 5th where he underwent right hemicolectomy which he tolerated  well.  I had a pulmonary consult to see him postoperatively because of some  question of sleep apnea probably made worse by his obesity.  The patient had  CPAP recommended but actually declined and was very adamant.  Furthermore,  he did not want any studies done on a sleep apnea, nor did he wish any  treatment for sleep apnea.  Fortunately, he managed very well without any  problems although frequently was sitting up in a chair rather than sleeping  in bed.  By the  third postoperative day, he had had a little bit of  confusion when he woke up but that cleared up readily and never became a  problem.  We put him on a little Reglan to see if we could increase bowel  function as well as give him a Dulcolax suppository and by the fourth postop  day was passing gas, not having any nausea, and was begun on clear liquids.   Pathology report showed a tubulovillous adenoma and there is no evidence of  cancer and that was discussed with the patient.  By the fifth postoperative  day, he was hungry and wanted more to eat, he had three bowel movements.  He  remained distended but he had a marked distended abdomen and part of this  was his obesity.  His electrolytes and magnesium were checked and appeared  to be okay and he continued to  do well.  By September 12, he was feeling  okay and was eager to go home.  He had further discussions regarding a close  followup with Dr. Sherin Quarry and how important those were and he promised that  he would followup within six months.  We also discussed a CPAP at home, home  health care for evaluation, etc., and basically the patient did decline any  sleep apnea.  Nevertheless, he was able to be discharged on September 12th  in satisfactory condition.   DISCHARGE MEDICATIONS:  To resume his usual home meds.  He felt that he did  not need any pain pills.                                               Currie Paris, M.D.   CJS/MEDQ  D:  08/03/2002  T:  08/03/2002  Job:  [id number]

## 2011-04-06 HISTORY — PX: CATARACT EXTRACTION: SUR2

## 2011-06-05 ENCOUNTER — Ambulatory Visit (INDEPENDENT_AMBULATORY_CARE_PROVIDER_SITE_OTHER): Payer: Medicare Other | Admitting: Family Medicine

## 2011-06-05 ENCOUNTER — Encounter: Payer: Self-pay | Admitting: Family Medicine

## 2011-06-05 VITALS — BP 118/70 | HR 80 | Wt 208.9 lb

## 2011-06-05 DIAGNOSIS — H6121 Impacted cerumen, right ear: Secondary | ICD-10-CM

## 2011-06-05 DIAGNOSIS — R609 Edema, unspecified: Secondary | ICD-10-CM

## 2011-06-05 DIAGNOSIS — E119 Type 2 diabetes mellitus without complications: Secondary | ICD-10-CM

## 2011-06-05 DIAGNOSIS — H6120 Impacted cerumen, unspecified ear: Secondary | ICD-10-CM

## 2011-06-05 DIAGNOSIS — M479 Spondylosis, unspecified: Secondary | ICD-10-CM

## 2011-06-05 DIAGNOSIS — D649 Anemia, unspecified: Secondary | ICD-10-CM

## 2011-06-05 LAB — BASIC METABOLIC PANEL
CO2: 21 mEq/L (ref 19–32)
Chloride: 102 mEq/L (ref 96–112)
Glucose, Bld: 125 mg/dL — ABNORMAL HIGH (ref 70–99)
Potassium: 4.1 mEq/L (ref 3.5–5.3)
Sodium: 133 mEq/L — ABNORMAL LOW (ref 135–145)

## 2011-06-05 LAB — CBC
HCT: 33.2 % — ABNORMAL LOW (ref 39.0–52.0)
MCH: 30.7 pg (ref 26.0–34.0)
MCHC: 32.8 g/dL (ref 30.0–36.0)
MCV: 93.5 fL (ref 78.0–100.0)
RDW: 14 % (ref 11.5–15.5)
WBC: 4.4 10*3/uL (ref 4.0–10.5)

## 2011-06-05 NOTE — Patient Instructions (Signed)
Your diabetes and hypertension are well controlled.   Continue your medications the same.  Recheck in 6 months to see Dr Sheffield Slider, sooner if needed.

## 2011-06-06 ENCOUNTER — Encounter: Payer: Self-pay | Admitting: Family Medicine

## 2011-06-06 DIAGNOSIS — H6121 Impacted cerumen, right ear: Secondary | ICD-10-CM | POA: Insufficient documentation

## 2011-06-06 NOTE — Assessment & Plan Note (Signed)
removed

## 2011-06-06 NOTE — Assessment & Plan Note (Addendum)
Hemoglobin down to 10.9 from 11.8 a year ago. Normocytic Will send hemacult cards and ask him to come in for a repeat CBC and ferritin in one month.

## 2011-06-06 NOTE — Assessment & Plan Note (Signed)
improve

## 2011-06-06 NOTE — Progress Notes (Signed)
  Subjective:    Patient ID: Peter [name], male    DOB: August 01, 1919, 77 y.o.   MRN: [id number]  HPI here because his right ear is blocked. He thinks he relates to his jaw joint because it will change with opening and closing   He admits to occasionally missing his metformin doses   he stopped taking lisinopril but  Can't remember why   He had a recent pacemaker check which was good   he continues rash on his feet but has not been using the fungal medication   His granddaughter and her husband frequently ask him for  Financial help    Review of Systems     Objective:   Physical Exam  Constitutional: He is oriented to person, place, and time. He appears well-developed.       Central obesity  HENT:  Left Ear: External ear normal.        Right ear completely blocked with cerumen which was removed by flushing with  Warm water  His hearing was much improved thereafter  Cardiovascular: Normal rate and regular rhythm.         Pacemaker in upper chest  Pulmonary/Chest: Effort normal and breath sounds normal.  Musculoskeletal: He exhibits no edema.        Diabetic foot exam was negative except for superficial desquamation of skin on the plantar surface  As fungal changes of nails.  Incision was normal to filament test  Neurological: He is alert and oriented to person, place, and time.          Assessment & Plan:

## 2011-06-06 NOTE — Assessment & Plan Note (Signed)
Well controlled 

## 2011-06-12 ENCOUNTER — Other Ambulatory Visit: Payer: Self-pay | Admitting: Family Medicine

## 2011-06-12 DIAGNOSIS — E119 Type 2 diabetes mellitus without complications: Secondary | ICD-10-CM

## 2011-06-12 NOTE — Telephone Encounter (Signed)
Refill request

## 2011-06-25 ENCOUNTER — Encounter: Payer: Self-pay | Admitting: Family Medicine

## 2011-06-26 ENCOUNTER — Telehealth: Payer: Self-pay | Admitting: Family Medicine

## 2011-06-26 NOTE — Telephone Encounter (Signed)
Pt cannot use the fecal card and wants to know if there is another way to get the results needed. Ask for 3rd floor and ask for [facility]

## 2011-06-26 NOTE — Telephone Encounter (Signed)
Will fwd. To Dr.Hale. (may have test in office) .Arlyss Repress

## 2011-06-27 ENCOUNTER — Encounter: Payer: Self-pay | Admitting: Family Medicine

## 2011-06-29 ENCOUNTER — Encounter: Payer: Self-pay | Admitting: Family Medicine

## 2011-06-29 NOTE — Telephone Encounter (Signed)
I called him. He had difficulty explaining why he couldn't do the hemacult cards. He would prefer to follow up with Dr Elnoria Howard being due for repeat colonoscopy and he will contact his office. I will send Dr Elnoria Howard a letter with his recent labs

## 2011-09-12 ENCOUNTER — Encounter: Payer: Self-pay | Admitting: Home Health Services

## 2011-10-02 ENCOUNTER — Telehealth: Payer: Self-pay | Admitting: Family Medicine

## 2011-10-02 NOTE — Telephone Encounter (Signed)
This is his daughter Elease Hashimoto questions whether letter she received about [facility]. I reassured her that the information be kept confidential. She said her father hasn't had a flu she had a shot yet and she would encourage him to do so. He also received a letter from Arlys John encourage him to come in for his annual review any plans to schedule that before the end of the year.

## 2011-10-02 NOTE — Telephone Encounter (Signed)
Will forward to Dr hale 

## 2011-10-02 NOTE — Telephone Encounter (Signed)
Mr. Caster daughter would like to speak to Dr. Sheffield Slider about some letter they have received about her fathers confidential information.

## 2011-10-16 ENCOUNTER — Ambulatory Visit (INDEPENDENT_AMBULATORY_CARE_PROVIDER_SITE_OTHER): Payer: Medicare Other | Admitting: Family Medicine

## 2011-10-16 ENCOUNTER — Encounter: Payer: Self-pay | Admitting: Family Medicine

## 2011-10-16 VITALS — BP 127/63 | HR 70 | Temp 97.7°F | Ht 66.0 in | Wt 215.0 lb

## 2011-10-16 DIAGNOSIS — L723 Sebaceous cyst: Secondary | ICD-10-CM

## 2011-10-16 DIAGNOSIS — Z23 Encounter for immunization: Secondary | ICD-10-CM

## 2011-10-16 DIAGNOSIS — M479 Spondylosis, unspecified: Secondary | ICD-10-CM

## 2011-10-16 DIAGNOSIS — L720 Epidermal cyst: Secondary | ICD-10-CM | POA: Insufficient documentation

## 2011-10-16 DIAGNOSIS — R413 Other amnesia: Secondary | ICD-10-CM

## 2011-10-16 DIAGNOSIS — Z950 Presence of cardiac pacemaker: Secondary | ICD-10-CM

## 2011-10-16 DIAGNOSIS — E669 Obesity, unspecified: Secondary | ICD-10-CM

## 2011-10-16 DIAGNOSIS — E119 Type 2 diabetes mellitus without complications: Secondary | ICD-10-CM

## 2011-10-16 NOTE — Assessment & Plan Note (Signed)
The chest discomfort isn't suggestive of tachycardia

## 2011-10-16 NOTE — Assessment & Plan Note (Signed)
No treatment needed unless it grows or becomes sore.

## 2011-10-16 NOTE — Assessment & Plan Note (Signed)
DJD suggested by pain and limited range of motion of neck without radiculopathy symptoms

## 2011-10-16 NOTE — Assessment & Plan Note (Addendum)
well controlled His blood pressure is good off the Lisinopril so will forgo renoprotection for now

## 2011-10-16 NOTE — Progress Notes (Signed)
  Subjective:    Patient ID: Peter [name], male    DOB: Sep 11, 1919, 77 y.o.   MRN: [id number]  HPI he is generally weaker and slower with the more memory problems. He is happy be alive though.   Has slight burning in his feet worse in the early morning. They've not been swelling.  On recent eye exam he was found to have  macular degeneration on the left with wavy vision on the grid.  He has soreness in the posterior neck with decreased range of motion. He recently got a massage collar for that.  He notes a firm lump about pecan size in the right buttock which he believes is a cyst, though it hasn't drained. It is minimally tender and not rapidly enlarging.   He had one episode while lying in bed of feeling weak in his chest which lasts about 30 minutes. He was afraid to get up to take anything. He denies any exertional discomfort.    Review of Systems  Constitutional: Negative for activity change.  Respiratory: Negative for cough, chest tightness and shortness of breath.   Cardiovascular: Negative for chest pain.  Genitourinary: Negative for difficulty urinating.  Musculoskeletal: Positive for arthralgias.       Objective:   Physical Exam  Constitutional: He is oriented to person, place, and time.       Obese, poorly conditioned  Cardiovascular: Normal rate and regular rhythm.   Pulmonary/Chest: Effort normal and breath sounds normal.  Musculoskeletal: He exhibits edema.       Trace edema  Right foot had no lesions and normal sensation  Neurological: He is alert and oriented to person, place, and time. No cranial nerve deficit.  Skin:       2.5 cm subcutaneous nodule right mid posterior buttock that puckers skin slightly, probable pore opening visualized  Psychiatric:       More subdued than usual          Assessment & Plan:

## 2011-10-16 NOTE — Assessment & Plan Note (Signed)
Gained a few pounds

## 2011-10-16 NOTE — Assessment & Plan Note (Signed)
Subjective, he seems to be functioning normally

## 2011-10-16 NOTE — Patient Instructions (Signed)
Return to see Dr Sheffield Slider in 3 months. I will do blood tests then.   You can take Acetaminophen 500 tables 1 every 4 hours as needed for the neck pain.   Your A1c of 6.8 shows good diabetes control, but try not to gain more weight  Come in sooner if the right buttock cyst grows rapidly or gets sore.Marland Kitchen

## 2012-01-14 ENCOUNTER — Other Ambulatory Visit: Payer: Self-pay | Admitting: Family Medicine

## 2012-01-14 NOTE — Telephone Encounter (Signed)
Refill request

## 2012-01-29 ENCOUNTER — Ambulatory Visit: Payer: Medicare Other | Admitting: Family Medicine

## 2012-02-12 ENCOUNTER — Other Ambulatory Visit: Payer: Self-pay | Admitting: Urology

## 2012-02-12 ENCOUNTER — Encounter: Payer: Self-pay | Admitting: Family Medicine

## 2012-02-12 ENCOUNTER — Ambulatory Visit (INDEPENDENT_AMBULATORY_CARE_PROVIDER_SITE_OTHER): Payer: Medicare Other | Admitting: Family Medicine

## 2012-02-12 VITALS — BP 111/71 | HR 100 | Ht 66.0 in | Wt 210.0 lb

## 2012-02-12 DIAGNOSIS — C61 Malignant neoplasm of prostate: Secondary | ICD-10-CM

## 2012-02-12 DIAGNOSIS — R413 Other amnesia: Secondary | ICD-10-CM

## 2012-02-12 DIAGNOSIS — E119 Type 2 diabetes mellitus without complications: Secondary | ICD-10-CM

## 2012-02-12 DIAGNOSIS — E785 Hyperlipidemia, unspecified: Secondary | ICD-10-CM

## 2012-02-12 DIAGNOSIS — E669 Obesity, unspecified: Secondary | ICD-10-CM

## 2012-02-12 DIAGNOSIS — D649 Anemia, unspecified: Secondary | ICD-10-CM

## 2012-02-12 NOTE — Patient Instructions (Signed)
I will call you if your lab tests are abnormal. I checked your blood count, liver and kidney function, and cholesterol.   Good luck with your bone scan and prostate treatment  Please return to see Dr Sheffield Slider in 6 months.

## 2012-02-13 LAB — COMPREHENSIVE METABOLIC PANEL
BUN: 17 mg/dL (ref 6–23)
CO2: 21 mEq/L (ref 19–32)
Glucose, Bld: 138 mg/dL — ABNORMAL HIGH (ref 70–99)
Sodium: 137 mEq/L (ref 135–145)
Total Bilirubin: 0.9 mg/dL (ref 0.3–1.2)
Total Protein: 7 g/dL (ref 6.0–8.3)

## 2012-02-13 LAB — LDL CHOLESTEROL, DIRECT: Direct LDL: 58 mg/dL

## 2012-02-13 NOTE — Assessment & Plan Note (Signed)
More apparent MMSE next visit

## 2012-02-13 NOTE — Assessment & Plan Note (Signed)
well controlled  

## 2012-02-13 NOTE — Assessment & Plan Note (Signed)
5 lb wt loss, but still in obese range

## 2012-02-13 NOTE — Assessment & Plan Note (Signed)
Slowly progressing without bone pain

## 2012-02-13 NOTE — Progress Notes (Signed)
  Subjective:    Patient ID: Peter [name], male    DOB: 05/28/1919, 78 y.o.   MRN: [id number]  HPINot much has changed. Main activity is helping his decendents.   He's thankful every day that he has very little pain  He was called yesterday that his PSA has reached 68 and Dr Isabel Caprice wants him to come in for a shot and a bone scan. I received a letter from him that was for the visit when the PSA was drawn.   Review of Systems     Objective:   Physical Exam  Constitutional: He appears well-nourished.       Central obesity  Cardiovascular: Normal rate and regular rhythm.   No murmur heard. Pulmonary/Chest: Effort normal and breath sounds normal. No respiratory distress. He has no rales.  Abdominal: Soft.  Musculoskeletal: He exhibits edema.       1+ bilaterally  Neurological: He is alert.  Psychiatric: He has a normal mood and affect. His behavior is normal. Thought content normal.       Loquatious, but frequent difficulty finding words.           Assessment & Plan:

## 2012-02-14 ENCOUNTER — Encounter: Payer: Self-pay | Admitting: Family Medicine

## 2012-02-22 ENCOUNTER — Ambulatory Visit (HOSPITAL_COMMUNITY): Payer: Medicare Other

## 2012-02-22 ENCOUNTER — Encounter (HOSPITAL_COMMUNITY): Payer: Medicare Other

## 2012-02-27 ENCOUNTER — Other Ambulatory Visit: Payer: Self-pay | Admitting: Family Medicine

## 2012-03-05 ENCOUNTER — Other Ambulatory Visit: Payer: Self-pay | Admitting: Urology

## 2012-03-05 DIAGNOSIS — C61 Malignant neoplasm of prostate: Secondary | ICD-10-CM

## 2012-03-12 ENCOUNTER — Encounter (HOSPITAL_COMMUNITY)
Admission: RE | Admit: 2012-03-12 | Discharge: 2012-03-12 | Disposition: A | Discharge status: Home / Self Care | Payer: Medicare Other | Source: Ambulatory Visit | Attending: Urology | Admitting: Urology

## 2012-03-12 ENCOUNTER — Ambulatory Visit (HOSPITAL_COMMUNITY)
Admission: RE | Admit: 2012-03-12 | Discharge: 2012-03-12 | Disposition: A | Discharge status: Home / Self Care | Payer: Medicare Other | Source: Ambulatory Visit | Attending: Urology | Admitting: Urology

## 2012-03-12 DIAGNOSIS — C61 Malignant neoplasm of prostate: Secondary | ICD-10-CM | POA: Insufficient documentation

## 2012-03-12 DIAGNOSIS — Z9181 History of falling: Secondary | ICD-10-CM | POA: Insufficient documentation

## 2012-03-12 DIAGNOSIS — R972 Elevated prostate specific antigen [PSA]: Secondary | ICD-10-CM | POA: Insufficient documentation

## 2012-03-12 MED ORDER — TECHNETIUM TC 99M MEDRONATE IV KIT
23.7000 | PACK | Freq: Once | INTRAVENOUS | Status: AC | PRN
  Administered 2012-03-12: 23.7 via INTRAVENOUS

## 2012-04-15 ENCOUNTER — Encounter: Payer: Self-pay | Admitting: Family Medicine

## 2012-04-15 ENCOUNTER — Ambulatory Visit (INDEPENDENT_AMBULATORY_CARE_PROVIDER_SITE_OTHER): Payer: Medicare Other | Admitting: Family Medicine

## 2012-04-15 VITALS — BP 122/68 | HR 72 | Temp 97.8°F | Ht 66.0 in | Wt 212.7 lb

## 2012-04-15 DIAGNOSIS — E119 Type 2 diabetes mellitus without complications: Secondary | ICD-10-CM

## 2012-04-15 DIAGNOSIS — E669 Obesity, unspecified: Secondary | ICD-10-CM

## 2012-04-15 DIAGNOSIS — K111 Hypertrophy of salivary gland: Secondary | ICD-10-CM

## 2012-04-15 DIAGNOSIS — D649 Anemia, unspecified: Secondary | ICD-10-CM

## 2012-04-15 DIAGNOSIS — C61 Malignant neoplasm of prostate: Secondary | ICD-10-CM

## 2012-04-15 LAB — POCT GLYCOSYLATED HEMOGLOBIN (HGB A1C): Hemoglobin A1C: 6.1

## 2012-04-15 LAB — CBC
Hemoglobin: 10.7 g/dL — ABNORMAL LOW (ref 13.0–17.0)
MCHC: 34.4 g/dL (ref 30.0–36.0)
RDW: 14.6 % (ref 11.5–15.5)

## 2012-04-15 MED ORDER — METFORMIN HCL ER 500 MG PO TB24: 500.0000 mg | ORAL_TABLET | Freq: Every day | ORAL | Status: DC

## 2012-04-15 NOTE — Progress Notes (Signed)
  Subjective:    Patient ID: Peter [name], male    DOB: Sep 20, 1919, 78 y.o.   MRN: [id number]  HPI His daughter schedule the appt due to his feeling a lump under his left mandible 1 week ago. It has decreased in size. No bad taste or nodule in the mouth. No tooth hurts, but most are worn down to the gums. No fever. No change in the lump with eating.   Otherwise feeling well without hypoglycemia symptoms   His bone scan did not show signs of prostate CA.   His energy level is slowly declining Review of Systems     see HPI for ROS  Objective:   Physical Exam  Constitutional: He is oriented to person, place, and time.  HENT:       Most teeth present, but eroded down to the gums with out signs of infection or tenderness. No visible or palpable abnormalities of the salivary ducts.   Neck: Normal range of motion. Neck supple. No tracheal deviation present. No thyromegaly present.       Soft fullness under right mid mandible without tenderness  Cardiovascular: Normal rate and regular rhythm.   No murmur heard. Pulmonary/Chest: Effort normal and breath sounds normal. He has no rales.  Abdominal: Soft. He exhibits no distension and no mass. There is no tenderness. There is no rebound and no guarding.  Neurological: He is oriented to person, place, and time. No cranial nerve deficit.  Psychiatric: He has a normal mood and affect. His behavior is normal. Judgment and thought content normal.          Assessment & Plan:

## 2012-04-15 NOTE — Assessment & Plan Note (Signed)
rule out as cause of fatigue, CBC ferritin

## 2012-04-15 NOTE — Assessment & Plan Note (Signed)
Most likely was a partial obstruction. Warned of signs of infection there or in teeth.

## 2012-04-15 NOTE — Assessment & Plan Note (Signed)
Unchanged

## 2012-04-15 NOTE — Assessment & Plan Note (Signed)
well controlled  

## 2012-04-15 NOTE — Patient Instructions (Addendum)
Your blood sugars are even more normal than before. Decrease the Metformin to one daily  I agree a salivary gland enlargement is most likely. It it enlarges and becomes painful, call Dr Sheffield Slider.   Please return to see Dr Sheffield Slider in 3months.

## 2012-04-15 NOTE — Assessment & Plan Note (Signed)
No sign of activity

## 2012-04-21 ENCOUNTER — Encounter: Payer: Self-pay | Admitting: Family Medicine

## 2012-10-23 ENCOUNTER — Ambulatory Visit (INDEPENDENT_AMBULATORY_CARE_PROVIDER_SITE_OTHER): Payer: Medicare Other | Admitting: Family Medicine

## 2012-10-23 ENCOUNTER — Ambulatory Visit: Payer: Medicare Other

## 2012-10-23 VITALS — BP 114/74 | HR 92 | Temp 97.5°F | Ht 66.0 in | Wt 210.0 lb

## 2012-10-23 DIAGNOSIS — L858 Other specified epidermal thickening: Secondary | ICD-10-CM

## 2012-10-23 DIAGNOSIS — D649 Anemia, unspecified: Secondary | ICD-10-CM

## 2012-10-23 DIAGNOSIS — E119 Type 2 diabetes mellitus without complications: Secondary | ICD-10-CM

## 2012-10-23 DIAGNOSIS — M479 Spondylosis, unspecified: Secondary | ICD-10-CM

## 2012-10-23 DIAGNOSIS — D485 Neoplasm of uncertain behavior of skin: Secondary | ICD-10-CM

## 2012-10-23 DIAGNOSIS — E785 Hyperlipidemia, unspecified: Secondary | ICD-10-CM

## 2012-10-23 LAB — POCT GLYCOSYLATED HEMOGLOBIN (HGB A1C): Hemoglobin A1C: 6.9

## 2012-10-23 NOTE — Assessment & Plan Note (Signed)
Likely cause of his limited neck rotation. No radiculopathy symptoms

## 2012-10-23 NOTE — Assessment & Plan Note (Signed)
well controlled  

## 2012-10-23 NOTE — Patient Instructions (Signed)
Please return to see Dr Sheffield Slider in 1 month and I'll check your skin where the probable Keratoacanthoma was removed.  Come in fasting and I will check your lab tests  I will let you know your biopsy pathology results when they come back.   Your diabetes and blood pressure are well controlled

## 2012-10-23 NOTE — Progress Notes (Signed)
  Subjective:    Patient ID: Peter [name], male    DOB: 1919/02/18, 78 y.o.   MRN: [id number]  HPI Neck pain - hurts to turn which impedes his driving. Drives only during the day and not on super highways.   Diabetes - Doesn't test his glucose. No hypoglycemic episodes.   Skin lesion - R upper arm appeared about a month ago and has grown rapidly. Not painful and no pus.   Feet - no problems except difficult to get down to clean them.  Review of Systems     Objective:   Physical Exam  Neck:       Decreased rotation. Pain with looking up and to the right No signs of radiculopathy  Cardiovascular: Normal rate and regular rhythm.   No murmur heard. Pulmonary/Chest: Effort normal and breath sounds normal.  Musculoskeletal: He exhibits no edema.       No problems, see diabetic foot exam  Skin:       1.5 cm raised 3 mm lesion left upper lateral arm with central keratinaceous core. Faint 1 cm surrounding erythema. Not tender          Assessment & Plan:

## 2012-10-23 NOTE — Assessment & Plan Note (Signed)
Shaved off and base hyfrecated. Sent for bx to insure not a squamous cell CA

## 2012-11-06 ENCOUNTER — Encounter: Payer: Self-pay | Admitting: Family Medicine

## 2012-11-11 ENCOUNTER — Telehealth: Payer: Self-pay | Admitting: Family Medicine

## 2012-11-11 NOTE — Telephone Encounter (Signed)
Patient is calling after receiving a letter from Dr. Sheffield Slider about the lesion that was removed from his arm.  He was [facility] to [facility] but there were only morning times and he said he is too old to get up and moving that early.  Dr. Sheffield Slider had a 3:00 today, but it was too short of notice.  The next opening between Geriatric and Dr. Sheffield Slider isn't until 1/28 so the patient asked to let Dr. Sheffield Slider know what the problem is and maybe we can work something out because he felt it was very important for him to be seen quickly.

## 2012-11-11 NOTE — Telephone Encounter (Signed)
Will fwd. To Dr.Hale for review. .Peter [name]  

## 2012-11-13 ENCOUNTER — Ambulatory Visit (INDEPENDENT_AMBULATORY_CARE_PROVIDER_SITE_OTHER): Payer: Medicare Other | Admitting: Family Medicine

## 2012-11-13 VITALS — BP 123/70 | HR 90 | Ht 66.0 in | Wt 212.0 lb

## 2012-11-13 DIAGNOSIS — H9190 Unspecified hearing loss, unspecified ear: Secondary | ICD-10-CM | POA: Insufficient documentation

## 2012-11-13 DIAGNOSIS — C44621 Squamous cell carcinoma of skin of unspecified upper limb, including shoulder: Secondary | ICD-10-CM

## 2012-11-13 DIAGNOSIS — Z0000 Encounter for general adult medical examination without abnormal findings: Secondary | ICD-10-CM

## 2012-11-13 DIAGNOSIS — Z23 Encounter for immunization: Secondary | ICD-10-CM

## 2012-11-13 MED ORDER — LISINOPRIL 2.5 MG PO TABS: 2.5000 mg | ORAL_TABLET | Freq: Every day | ORAL | Status: DC

## 2012-11-13 NOTE — Assessment & Plan Note (Signed)
cerumen and debris removed for external auditory canal

## 2012-11-13 NOTE — Progress Notes (Signed)
Subjective:     Patient ID: Peter [name], male   DOB: Jun 20, 1919, 79 y.o.   MRN: [id number]  HPI 79 yo M presents for f/u visit to discuss the following:  1. R upper arm lesion: biopsed in office 10/23/12. Denies pain or drainage from site. 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   2. Decreased hearing- has never worn hearing aides. Does not feel limited by his hearing.   3. ? Handicap placard interested in a placard. Denies functional limitation.   4. Health maintenance: due for flu and willing to get it today.   Review of Systems As per HPI     Objective:   Physical Exam BP 123/70  Pulse 90  Ht 5\' 6"  (1.676 m)  Wt 212 lb (96.163 kg)  BMI 34.22 kg/m2 General appearance: alert, cooperative and no distress, appears younger than stated age  Ears: abnormal external canal right ear - debris impacting canal, removed with irrigation and currette and abnormal external canal left ear - debris in canal, partially removed with irrigation.  Skin: L upper outer arm: 1x1cm hyperemic papule with raised and rolled border.  Lymph nodes: no axillary adenopathy  Neurologic: Grossly normal  Hearing test: L side: unable to hear all frequencies at 40 dB R side: able to hear 500 Hz at 40 dB    Assessment and Plan:

## 2012-11-13 NOTE — Patient Instructions (Addendum)
Peter [name],   Thank you for coming in today  My clinic staff will call you regarding your dermatologist appt for complete excision of your squamous cell carcinoma.   Regarding handicap placard fortunately you do not qualify.  You received your flu shot today.   Drs. Bertram Haddix and [employer]

## 2012-11-13 NOTE — Assessment & Plan Note (Signed)
Flu shot administered today. 

## 2012-11-13 NOTE — Assessment & Plan Note (Signed)
A: L upper arm SCC P: urgent referral to dermatology for complete excision.

## 2012-11-18 ENCOUNTER — Other Ambulatory Visit: Payer: Self-pay | Admitting: Dermatology

## 2012-11-21 NOTE — Telephone Encounter (Signed)
Has this been addressed?

## 2012-12-02 ENCOUNTER — Ambulatory Visit: Payer: Medicare Other | Admitting: Family Medicine

## 2013-03-21 ENCOUNTER — Emergency Department (HOSPITAL_COMMUNITY): Payer: Medicare Other

## 2013-03-21 ENCOUNTER — Inpatient Hospital Stay (HOSPITAL_COMMUNITY)
Admission: EM | Admit: 2013-03-21 | Discharge: 2013-03-25 | DRG: 723 | Disposition: A | Discharge status: Home Health | Payer: Medicare Other | Attending: Family Medicine | Admitting: Family Medicine

## 2013-03-21 ENCOUNTER — Encounter (HOSPITAL_COMMUNITY): Payer: Self-pay | Admitting: *Deleted

## 2013-03-21 DIAGNOSIS — I10 Essential (primary) hypertension: Secondary | ICD-10-CM | POA: Diagnosis present

## 2013-03-21 DIAGNOSIS — Z79899 Other long term (current) drug therapy: Secondary | ICD-10-CM

## 2013-03-21 DIAGNOSIS — E119 Type 2 diabetes mellitus without complications: Secondary | ICD-10-CM

## 2013-03-21 DIAGNOSIS — K219 Gastro-esophageal reflux disease without esophagitis: Secondary | ICD-10-CM | POA: Diagnosis present

## 2013-03-21 DIAGNOSIS — Z6830 Body mass index (BMI) 30.0-30.9, adult: Secondary | ICD-10-CM

## 2013-03-21 DIAGNOSIS — C7951 Secondary malignant neoplasm of bone: Secondary | ICD-10-CM | POA: Diagnosis present

## 2013-03-21 DIAGNOSIS — H9190 Unspecified hearing loss, unspecified ear: Secondary | ICD-10-CM | POA: Diagnosis present

## 2013-03-21 DIAGNOSIS — E785 Hyperlipidemia, unspecified: Secondary | ICD-10-CM

## 2013-03-21 DIAGNOSIS — N400 Enlarged prostate without lower urinary tract symptoms: Secondary | ICD-10-CM

## 2013-03-21 DIAGNOSIS — E669 Obesity, unspecified: Secondary | ICD-10-CM | POA: Diagnosis present

## 2013-03-21 DIAGNOSIS — C4441 Basal cell carcinoma of skin of scalp and neck: Secondary | ICD-10-CM

## 2013-03-21 DIAGNOSIS — Z87891 Personal history of nicotine dependence: Secondary | ICD-10-CM

## 2013-03-21 DIAGNOSIS — L408 Other psoriasis: Secondary | ICD-10-CM | POA: Diagnosis present

## 2013-03-21 DIAGNOSIS — Z860100 Personal history of colon polyps, unspecified: Secondary | ICD-10-CM

## 2013-03-21 DIAGNOSIS — H3530 Unspecified macular degeneration: Secondary | ICD-10-CM | POA: Diagnosis present

## 2013-03-21 DIAGNOSIS — R531 Weakness: Secondary | ICD-10-CM

## 2013-03-21 DIAGNOSIS — G4733 Obstructive sleep apnea (adult) (pediatric): Secondary | ICD-10-CM | POA: Diagnosis present

## 2013-03-21 DIAGNOSIS — C44629 Squamous cell carcinoma of skin of left upper limb, including shoulder: Secondary | ICD-10-CM

## 2013-03-21 DIAGNOSIS — K769 Liver disease, unspecified: Secondary | ICD-10-CM

## 2013-03-21 DIAGNOSIS — R4182 Altered mental status, unspecified: Secondary | ICD-10-CM | POA: Diagnosis present

## 2013-03-21 DIAGNOSIS — G4730 Sleep apnea, unspecified: Secondary | ICD-10-CM | POA: Diagnosis present

## 2013-03-21 DIAGNOSIS — R748 Abnormal levels of other serum enzymes: Secondary | ICD-10-CM

## 2013-03-21 DIAGNOSIS — Z515 Encounter for palliative care: Secondary | ICD-10-CM

## 2013-03-21 DIAGNOSIS — R5381 Other malaise: Secondary | ICD-10-CM

## 2013-03-21 DIAGNOSIS — R627 Adult failure to thrive: Secondary | ICD-10-CM | POA: Diagnosis present

## 2013-03-21 DIAGNOSIS — D649 Anemia, unspecified: Secondary | ICD-10-CM

## 2013-03-21 DIAGNOSIS — Z85828 Personal history of other malignant neoplasm of skin: Secondary | ICD-10-CM

## 2013-03-21 DIAGNOSIS — C61 Malignant neoplasm of prostate: Principal | ICD-10-CM

## 2013-03-21 DIAGNOSIS — Z9181 History of falling: Secondary | ICD-10-CM

## 2013-03-21 DIAGNOSIS — M1990 Unspecified osteoarthritis, unspecified site: Secondary | ICD-10-CM | POA: Diagnosis present

## 2013-03-21 DIAGNOSIS — K5730 Diverticulosis of large intestine without perforation or abscess without bleeding: Secondary | ICD-10-CM | POA: Diagnosis present

## 2013-03-21 DIAGNOSIS — R609 Edema, unspecified: Secondary | ICD-10-CM

## 2013-03-21 DIAGNOSIS — R413 Other amnesia: Secondary | ICD-10-CM

## 2013-03-21 DIAGNOSIS — M479 Spondylosis, unspecified: Secondary | ICD-10-CM | POA: Diagnosis present

## 2013-03-21 DIAGNOSIS — R7989 Other specified abnormal findings of blood chemistry: Secondary | ICD-10-CM

## 2013-03-21 DIAGNOSIS — Z8546 Personal history of malignant neoplasm of prostate: Secondary | ICD-10-CM

## 2013-03-21 DIAGNOSIS — I872 Venous insufficiency (chronic) (peripheral): Secondary | ICD-10-CM | POA: Diagnosis present

## 2013-03-21 DIAGNOSIS — Z950 Presence of cardiac pacemaker: Secondary | ICD-10-CM | POA: Diagnosis present

## 2013-03-21 LAB — BASIC METABOLIC PANEL
CO2: 25 mEq/L (ref 19–32)
Glucose, Bld: 195 mg/dL — ABNORMAL HIGH (ref 70–99)
Potassium: 4.4 mEq/L (ref 3.5–5.1)
Sodium: 133 mEq/L — ABNORMAL LOW (ref 135–145)

## 2013-03-21 LAB — URINALYSIS, ROUTINE W REFLEX MICROSCOPIC
Bilirubin Urine: NEGATIVE
Glucose, UA: NEGATIVE mg/dL
Hgb urine dipstick: NEGATIVE
Ketones, ur: NEGATIVE mg/dL
Leukocytes, UA: NEGATIVE
Nitrite: NEGATIVE
Protein, ur: NEGATIVE mg/dL
Specific Gravity, Urine: 1.025 (ref 1.005–1.030)
Urobilinogen, UA: 1 mg/dL (ref 0.0–1.0)
pH: 5 (ref 5.0–8.0)

## 2013-03-21 LAB — BASIC METABOLIC PANEL WITH GFR
BUN: 15 mg/dL (ref 6–23)
Calcium: 9 mg/dL (ref 8.4–10.5)
Chloride: 97 meq/L (ref 96–112)
Creatinine, Ser: 0.79 mg/dL (ref 0.50–1.35)
GFR calc Af Amer: 88 mL/min — ABNORMAL LOW (ref >90)
GFR calc non Af Amer: 76 mL/min — ABNORMAL LOW (ref >90)

## 2013-03-21 LAB — APTT: aPTT: 43 s — ABNORMAL HIGH (ref 24–37)

## 2013-03-21 LAB — HEPATIC FUNCTION PANEL
ALT: 19 U/L (ref 0–53)
AST: 74 U/L — ABNORMAL HIGH (ref 0–37)
Albumin: 3.3 g/dL — ABNORMAL LOW (ref 3.5–5.2)
Alkaline Phosphatase: 1659 U/L — ABNORMAL HIGH (ref 39–117)
Bilirubin, Direct: 0.2 mg/dL (ref 0.0–0.3)
Indirect Bilirubin: 0.4 mg/dL (ref 0.3–0.9)
Total Bilirubin: 0.6 mg/dL (ref 0.3–1.2)
Total Protein: 7.6 g/dL (ref 6.0–8.3)

## 2013-03-21 LAB — LIPASE, BLOOD: Lipase: 43 U/L (ref 11–59)

## 2013-03-21 LAB — POCT I-STAT TROPONIN I: Troponin i, poc: 0.03 ng/mL (ref 0.00–0.08)

## 2013-03-21 LAB — CBC
HCT: 30.3 % — ABNORMAL LOW (ref 39.0–52.0)
Hemoglobin: 10.7 g/dL — ABNORMAL LOW (ref 13.0–17.0)
MCH: 31.4 pg (ref 26.0–34.0)
MCHC: 35.3 g/dL (ref 30.0–36.0)
MCV: 88.9 fL (ref 78.0–100.0)
Platelets: 206 K/uL (ref 150–400)
RBC: 3.41 MIL/uL — ABNORMAL LOW (ref 4.22–5.81)
RDW: 13.2 % (ref 11.5–15.5)
WBC: 5.1 K/uL (ref 4.0–10.5)

## 2013-03-21 LAB — PROTIME-INR
INR: 1.18 (ref 0.00–1.49)
Prothrombin Time: 14.8 s (ref 11.6–15.2)

## 2013-03-21 LAB — LACTATE DEHYDROGENASE: LDH: 479 U/L — ABNORMAL HIGH (ref 94–250)

## 2013-03-21 MED ORDER — FENTANYL CITRATE 0.05 MG/ML IJ SOLN
50.0000 ug | Freq: Once | INTRAMUSCULAR | Status: AC
  Administered 2013-03-21: 50 ug via INTRAVENOUS
  Filled 2013-03-21: qty 2

## 2013-03-21 MED ORDER — IOHEXOL 300 MG/ML  SOLN: 100.0000 mL | Freq: Once | INTRAMUSCULAR | Status: AC | PRN

## 2013-03-21 MED ORDER — IOHEXOL 300 MG/ML  SOLN
50.0000 mL | Freq: Once | INTRAMUSCULAR | Status: AC | PRN
  Administered 2013-03-21: 50 mL via ORAL

## 2013-03-21 MED ORDER — SODIUM CHLORIDE 0.9 % IV BOLUS (SEPSIS)
500.0000 mL | Freq: Once | INTRAVENOUS | Status: AC
  Administered 2013-03-21: 500 mL via INTRAVENOUS

## 2013-03-21 MED ORDER — SODIUM CHLORIDE 0.9 % IV BOLUS (SEPSIS)
1000.0000 mL | Freq: Once | INTRAVENOUS | Status: AC
  Administered 2013-03-21: 1000 mL via INTRAVENOUS

## 2013-03-21 NOTE — ED Provider Notes (Signed)
History     CSN: [id number]  Arrival date & time 03/21/13  1941   First MD Initiated Contact with Patient 03/21/13 2100      Chief Complaint  Patient presents with  . Weakness    (Consider location/radiation/quality/duration/timing/severity/associated sxs/prior treatment) HPI Comments: Level 5 caveat due to altered mentation.  Pt lives alone, GD is here with pt and checks on pt often.  Pt has had worsening weakness and inability to ambulate and care for self in past week.  He reports no HA, neck pain, unsure of fevers or chills, denies cough, congestion, vomiting, diarrhea.  He hasn't eaten much in a few days.  Pt had fallen from bed earlier this AM, pt wasn't able to get himself up for a few hours. GD convinced him to come home with her and she watched him for several hours, but he was acting subtly confused, and continued to be weak, and now is complaining of brief episodes of sharp pains, mostly in the abdominal area but some in chest area as well.  Denies dysuria.  He ahs prior h/o prostate cancer and melanoma in the past. Also DM, HTN, followed by Dr. Sheffield Slider with [facility].  He has a pacemaker, denies h/o MI or CVA in the past.    Patient is a 79 y.o. male presenting with weakness. The history is provided by the patient and a relative.  Weakness    Past Medical History  Diagnosis Date  . Cardiac septal defect     closed self during childhood  . Blood transfusion     colon polypectomy, bleeding requiring transfusion  . Hx of colonic polyps 03/03/2005    adenomatous  . Obstructive sleep apnea   . Anemia   . Diabetes mellitus   . Diverticulosis of colon   . GERD without esophagitis   . Hyperlipidemia   . Macular degeneration   . Memory loss of   . Osteoarthritis of spine   . Periumbilical hernia   . Prostate cancer     Urologist watchfully waiting  . Psoriasis   . Chronic venous insufficiency     Past Surgical History  Procedure Laterality Date  . Tonsillectomy    . Partial  colectomy  07/09/02    For Polyps  . Pacemaker placement  02/06/05  . 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 Cataract extraction  6/12    left eye Dr Dione Booze  . Pacemaker replacement  06/2010    Dr Amil Amen    Family History  Problem Relation Age of Onset  . Dementia Mother     Died at 21  . Cancer Father   . Cancer Brother   . Cancer Paternal Grandmother   . Kidney failure Maternal Uncle   . Alzheimer's disease Daughter   . COPD Daughter     History  Substance Use Topics  . Smoking status: Former Smoker    Types: Pipe  . Smokeless tobacco: Not on file  . Alcohol Use: Yes     Comment: 1 Beer Daily      Review of Systems  Unable to perform ROS: Mental status change  Neurological: Positive for weakness.    Allergies  Review of patient's allergies indicates no known allergies.  Home Medications   Current Outpatient Rx  Name  Route  Sig  Dispense  Refill  . metFORMIN (GLUCOPHAGE-XR) 500 MG 24 hr tablet   Oral   Take 1 tablet (500 mg total) by mouth daily with  breakfast.   30 tablet   11   . lisinopril (PRINIVIL,ZESTRIL) 2.5 MG tablet   Oral   Take 2.5 mg by mouth every morning.         . simvastatin (ZOCOR) 20 MG tablet   Oral   Take 20 mg by mouth every evening.           BP 137/68  Pulse 97  Temp(Src) 100.5 F (38.1 C) (Rectal)  Resp 19  Ht 5\' 6"  (1.676 m)  Wt 190 lb 9.6 oz (86.456 kg)  BMI 30.78 kg/m2  SpO2 99%  Physical Exam  Nursing note and vitals reviewed. Constitutional: He appears well-developed and well-nourished. No distress.  HENT:  Head: Normocephalic and atraumatic.  Mouth/Throat: Uvula is midline. Mucous membranes are not pale and dry. No oropharyngeal exudate.  Eyes: Conjunctivae and EOM are normal. No scleral icterus.  Neck: Normal range of motion. Neck supple. No JVD present. No thyromegaly present.  Cardiovascular: Normal rate, regular rhythm and intact distal pulses.   No murmur heard. Pulmonary/Chest: Effort  normal. He has no wheezes. He has no rales.  Abdominal: Soft. Normal appearance. He exhibits distension. Bowel sounds are decreased. There is tenderness. There is no rebound, no guarding and no CVA tenderness. A hernia is present. Hernia confirmed positive in the ventral area.  Musculoskeletal: He exhibits no edema.  Neurological: He is alert. He exhibits normal muscle tone. Coordination normal.  Psychiatric: He has a normal mood and affect.    ED Course  Procedures (including critical care time)  Labs Reviewed  CBC - Abnormal; Notable for the following:    RBC 3.41 (*)    Hemoglobin 10.7 (*)    HCT 30.3 (*)    All other components within normal limits  BASIC METABOLIC PANEL - Abnormal; Notable for the following:    Sodium 133 (*)    Glucose, Bld 195 (*)    GFR calc non Af Amer 76 (*)    GFR calc Af Amer 88 (*)    All other components within normal limits  HEPATIC FUNCTION PANEL - Abnormal; Notable for the following:    Albumin 3.3 (*)    AST 74 (*)    Alkaline Phosphatase 1659 (*)    All other components within normal limits  LACTATE DEHYDROGENASE - Abnormal; Notable for the following:    LDH 479 (*)    All other components within normal limits  APTT - Abnormal; Notable for the following:    aPTT 43 (*)    All other components within normal limits  URINE CULTURE  URINALYSIS, ROUTINE W REFLEX MICROSCOPIC  LIPASE, BLOOD  PROTIME-INR  POCT I-STAT TROPONIN I   Dg Chest 2 View  03/21/2013   *RADIOLOGY REPORT*  Clinical Data: Weakness.  Diabetes.  CHEST - 2 VIEW  Comparison: 03/21/2013  Findings: Dual lead pacer remains in place.  Mild cardiomegaly noted. Chronically elevated right hemidiaphragm noted with poor definition of the left hemidiaphragm as before.  On the lateral projection, there is subtle posterior blunting of both hemidiaphragms suggesting trace bilateral pleural effusions. Subsegmental atelectasis or scarring noted along the right hemidiaphragm.  No pulmonary edema.   IMPRESSION:  1.  Mild blunting of both posterior costophrenic angles compatible with trace bilateral pleural effusions. 2.  Cardiomegaly, without edema. 3.  Elevated right hemidiaphragm.   Original Report Authenticated By: Gaylyn Rong, M.D.   Dg Chest Port 1 View  03/21/2013   *RADIOLOGY REPORT*  Clinical Data: Weakness.  Left-sided chest pain.  PORTABLE CHEST - 1 VIEW  Comparison: 08/28/2010  Findings: Cardiomegaly noted with dual lead pacer in place, lead tip position is unchanged.  Mildly elevated right hemidiaphragm, chronic.  Chronic poor definition of the left hemidiaphragm.  Thoracic spondylosis noted.  IMPRESSION:  1.  Cardiomegaly, without edema. 2.  Chronically elevated right hemidiaphragm. 3.  Chronic poor definition of the left hemidiaphragm likely attributable to diaphragmatic configuration, but reduces negative predictive value for assessment of the left lower lobe on frontal radiography.   Original Report Authenticated By: Gaylyn Rong, M.D.   Dg Abd 2 Views  03/21/2013   *RADIOLOGY REPORT*  Clinical Data: Abdominal pain and distention  ABDOMEN - 2 VIEW  Comparison: None  Findings: No dilated loops of small bowel identified.  On the decubitus radiograph there are multiple colonic fluid levels.  No abnormal abdominal or pelvic calcifications.  IMPRESSION:  1.  No evidence for small bowel obstruction. 2.  Colonic fluid levels noted.   Original Report Authenticated By: Signa Kell, M.D.     1. Weakness   2. Hepatic lesion   3. Elevated LFTs   4. Elevated alkaline phosphatase level   5. History of prostate cancer     O2 sat on Bliss O2 is 100% and I interpret to be adequate  ECG at time 20:29 shows paced rhythm, ventricular at rate 85, atrial paced. No change from June 06, 2010.   10:23 PM Alk phos is very highly elevated to 1659.  Concern since pt has h/o prostate cancer . AST also elevated.  Will get CT of abd, pt's symptoms may be due to metastatic cancer.    12:18  AM Spoke to [facility] resident at [facility] who accepts pt in transfer to tele bed at [facility].   MDM  Pt with generalized weakness, no focal deficits, mild confusion, disorientation to time.  Pt appears clinically dehydrated.  UTI, pneumonia are possibilities.  Abd is somewhat distended, will obtain plain films, may need CT scan as well.          Gavin Pound. Oletta Lamas, MD 03/22/13 1610

## 2013-03-21 NOTE — ED Notes (Signed)
Per granddaughter whom is at bedside and she brought pt in, she states he is here for weakness,  Decreased appetite,  He lives in home with no heat or air and she is afraid that may be affecting his health also.  He has also been forgetting to take his medication,  Pt has pain all over that is intermittent and very little to drink

## 2013-03-21 NOTE — ED Notes (Signed)
Pt is unable to uirnate at this time.

## 2013-03-22 ENCOUNTER — Encounter (HOSPITAL_COMMUNITY): Payer: Self-pay | Admitting: *Deleted

## 2013-03-22 DIAGNOSIS — R413 Other amnesia: Secondary | ICD-10-CM

## 2013-03-22 DIAGNOSIS — E119 Type 2 diabetes mellitus without complications: Secondary | ICD-10-CM

## 2013-03-22 LAB — BASIC METABOLIC PANEL
Calcium: 8.3 mg/dL — ABNORMAL LOW (ref 8.4–10.5)
Creatinine, Ser: 0.73 mg/dL (ref 0.50–1.35)
GFR calc Af Amer: >90 mL/min (ref >90)
GFR calc non Af Amer: 79 mL/min — ABNORMAL LOW (ref >90)
Sodium: 132 mEq/L — ABNORMAL LOW (ref 135–145)

## 2013-03-22 LAB — CBC
MCV: 89.4 fL (ref 78.0–100.0)
Platelets: 147 10*3/uL — ABNORMAL LOW (ref 150–400)
RDW: 13.4 % (ref 11.5–15.5)
WBC: 4.1 10*3/uL (ref 4.0–10.5)

## 2013-03-22 LAB — GLUCOSE, CAPILLARY
Glucose-Capillary: 140 mg/dL — ABNORMAL HIGH (ref 70–99)
Glucose-Capillary: 146 mg/dL — ABNORMAL HIGH (ref 70–99)

## 2013-03-22 MED ORDER — SODIUM CHLORIDE 0.9 % IV SOLN: 250.0000 mL | INTRAVENOUS | Status: DC | PRN

## 2013-03-22 MED ORDER — SODIUM CHLORIDE 0.9 % IJ SOLN
3.0000 mL | Freq: Two times a day (BID) | INTRAMUSCULAR | Status: DC
  Administered 2013-03-22 – 2013-03-24 (×5): 3 mL via INTRAVENOUS

## 2013-03-22 MED ORDER — ASPIRIN EC 81 MG PO TBEC
81.0000 mg | DELAYED_RELEASE_TABLET | Freq: Every day | ORAL | Status: DC
  Administered 2013-03-22 – 2013-03-25 (×4): 81 mg via ORAL
  Filled 2013-03-22 (×4): qty 1

## 2013-03-22 MED ORDER — SODIUM CHLORIDE 0.9 % IJ SOLN
3.0000 mL | Freq: Two times a day (BID) | INTRAMUSCULAR | Status: DC
  Administered 2013-03-22: 3 mL via INTRAVENOUS

## 2013-03-22 MED ORDER — MORPHINE SULFATE 2 MG/ML IJ SOLN: 1.0000 mg | INTRAMUSCULAR | Status: DC | PRN

## 2013-03-22 MED ORDER — SODIUM CHLORIDE 0.9 % IJ SOLN: 3.0000 mL | INTRAMUSCULAR | Status: DC | PRN

## 2013-03-22 MED ORDER — INSULIN ASPART 100 UNIT/ML ~~LOC~~ SOLN
0.0000 [IU] | Freq: Three times a day (TID) | SUBCUTANEOUS | Status: DC
Start: 2013-03-22 — End: 2013-03-25
  Administered 2013-03-22: 1 [IU] via SUBCUTANEOUS
  Administered 2013-03-22 (×2): 2 [IU] via SUBCUTANEOUS
  Administered 2013-03-23: 1 [IU] via SUBCUTANEOUS
  Administered 2013-03-23: 2 [IU] via SUBCUTANEOUS
  Administered 2013-03-23 – 2013-03-25 (×4): 1 [IU] via SUBCUTANEOUS
  Administered 2013-03-25: 2 [IU] via SUBCUTANEOUS

## 2013-03-22 MED ORDER — IOHEXOL 300 MG/ML  SOLN
100.0000 mL | Freq: Once | INTRAMUSCULAR | Status: AC | PRN
  Administered 2013-03-22: 100 mL via INTRAVENOUS

## 2013-03-22 NOTE — H&P (Signed)
Family Medicine Teaching [facility] Admission History and Physical  Patient name: Peter [name] Medical record number: [id number] Date of birth: 16-Nov-1918 Age: 79 y.o. Gender: male  Primary Care Provider: Tobin Chad, MD  Chief Complaint: AMS, FTT History of Present Illness: Peter [name] is a 79 y.o. male with PMHx significant for Prostate Ca, skin Ca, HTN, HLD, DM, and Sleep Apnea presens to WL after a week of worsening weakness, and not able to care for himself at home. Pt lives alone and was able to perform his ADL's prior to this. His granddaughter uses to check on him and found out that yesterday morning he fell from bed and reports was not able to walk. Pt reports to me that he fell trying to rush to reach for his phone but was able to stand up quickly and resume the rest of his activities. He denies symptoms of dizziness, HA, numbness or focalized weakness. No dysuria or changes on urinary habits. No fever, chills or respiratory symptoms. He reported intermittent scattered abdominal pain, A CT of abdomen/pelvis was obtained that showed possible necrotic or cystic mass in the right peripheral zone of the prostate gland, with multiple metastasis lesions throughout the skeleton. We were called to transfer pt to our service at [facility] hospital.  Patient Active Problem List   Diagnosis Date Noted  . Squamous cell carcinoma of skin of upper arm 11/13/2012  . Hearing loss 11/13/2012  . Healthcare maintenance 11/13/2012  . Epithelial inclusion cyst 10/16/2011  . Cataracts, both eyes 03/21/2011  . DIABETES MELLITUS 12/07/2010  . DEPENDENT EDEMA, LEGS, BILATERAL 12/07/2010  . BASAL CELL CARCINOMA OF SCALP AND SKIN OF NECK 09/26/2010  . ANEMIA 05/02/2010  . PACEMAKER, PERMANENT 09/13/2009  . Hypertrophy of breast 02/07/2009  . MEMORY LOSS 07/01/2008  . PERIUMBILICAL HERNIA 07/03/2007  . DECREASED HEARING, RIGHT EAR 05/27/2007  . PROSTATE CANCER 01/02/2007  . HYPERLIPIDEMIA  01/02/2007  . OBESITY, NOS 01/02/2007  . MACULAR DEGENERATION 01/02/2007  . VENOUS INSUFFICIENCY, CHRONIC 01/02/2007  . HYPERTROPHY PROSTATE W/O UR OBST & OTH LUTS 01/02/2007  . PSORIASIS 01/02/2007  . OSTEOARTHRITIS OF SPINE, NOS 01/02/2007  . APNEA, SLEEP 01/02/2007   Past Medical History: Past Medical History  Diagnosis Date  . Cardiac septal defect     closed self during childhood  . Blood transfusion     colon polypectomy, bleeding requiring transfusion  . Hx of colonic polyps 03/03/2005    adenomatous  . Obstructive sleep apnea   . Anemia   . Diabetes mellitus   . Diverticulosis of colon   . GERD without esophagitis   . Hyperlipidemia   . Macular degeneration   . Memory loss of   . Osteoarthritis of spine   . Periumbilical hernia   . Prostate cancer     Urologist watchfully waiting  . Psoriasis   . Chronic venous insufficiency    Past Surgical History: Past Surgical History  Procedure Laterality Date  . Tonsillectomy    . Partial colectomy  07/09/02    For Polyps  . Pacemaker placement  02/06/05  . 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 Cataract extraction  6/12    left eye Dr Dione Booze  . Pacemaker replacement  06/2010    Dr Amil Amen   Social History: History   Social History  . Marital Status: Widowed    Spouse Name: N/A    Number of Children: 3  . Years of Education: 13   Occupational History  .  Retired     Agricultural consultant    Social History Main Topics  . Smoking status: Former Smoker    Types: Pipe  . Smokeless tobacco: None  . Alcohol Use: Yes     Comment: 1 Beer Daily  . Drug Use: None  . Sexually Active: None   Other Topics Concern  . None   Social History Narrative   Daughter Cynthia-estranged from family since 73. Her daughters who live in [place] each have 2 children.   Granddaughter, Burman Foster, In [place]   Daughter, BeBe in Nursing Home with Alzheimers   Daughter, Elease Hashimoto in ALF due to back problems   Family  History: Family History  Problem Relation Age of Onset  . Dementia Mother     Died at 37  . Cancer Father   . Cancer Brother   . Cancer Paternal Grandmother   . Kidney failure Maternal Uncle   . Alzheimer's disease Daughter   . COPD Daughter    Allergies: No Known Allergies No current facility-administered medications for this encounter.    Review Of Systems: Per HPI   Physical Exam: Filed Vitals:   03/22/13 0246  BP: 110/57  Pulse: 89  Temp: 98.3 F (36.8 C)  Resp:    Gen:  NAD, very pleasant pt answering to questions appropriate. HEENT: Moist mucous membranes CV: Regular rate and rhythm, no murmurs rubs or gallops PULM: Clear to auscultation bilaterally. No wheezes/rales/rhonchi ABD: Soft, non tender, non distended, normal bowel sounds EXT: No edema Neuro: Alert and oriented x3. No focalization   Imaging Ct of adbomen and pelvis 03/22/13 IMPRESSION:  1. Possible necrotic or cystic mass in the right peripheral zone of the prostate gland. Scattered ill-defined sclerotic lesions throughout the skeleton, suspicious for prostate metastatic disease.  2. Pathologically enlarged right external iliac lymph node,suspicious for malignancy.  3. Cardiomegaly.  4. Hepatic cirrhosis without liver mass seen.  5. Benign appearing right renal cysts.  6. Pars defects at L5 with 6 mm anterolisthesis and right foraminal stenosis.  Meds: Scheduled Meds: Continuous Infusions: PRN Meds:.sodium chloride, morphine injection, sodium chloride  Assessment and Plan: Peter [name] is a 79 y.o.  male with PMX significant for prostate Ca and skin Ca, DM, HTN, HLD, Anemia, and Sleep Apnea is admitted with weakness and abdominal pain. CT finding positive for multiple bone metastasis, and necrotic/cystic lesion on Prostate also concerning for metastatic disease.  1. AMS: reported by granddaughter per [facility] documentation. History of fall. On ED report, and to me pt is alert and oriented X3. No  neurologic motor/sensitive deficit. Pt reports he drives and last time he drove was yesterday. Unsure about this alrered state reported. Granddaughter  no in the room at the time of evaluation. Will gather more information about this event. - admitted to telemetry floor. -  PT/OT consult  2. Weakness:  Decreased oral intake and difficulty taking care for himself of a week of duration. No problems with appetite but rather with ability and desire to self prepare his meals. (pt does not cook, he eats at restaurants and due to weakness did not feel like going out) - PT/OT as above  3. Prostate Ca (washfull waiting per Urology) now with elevated Alk phos and lesion on CT that are consistent with bone met and possible necrotic met on prostate. - new findings discussed with pt - pain management with morphine PRN.  - Palliative care consult will be able to help setting GOC with pt's terminal condition.  4. HTN:  BP on the lower side.  - will hold lisinopril - monitor BP  5. Hx arrythmia s/p pacemaker:  -admitted to telemetry -monitor  6. Anemia: stable at 10.7 for a year. Iron panel last year was wnl. Most likely secondary to chronic disease -monitor  6. HLD on simvastatin - AST elevation. Will hold simvastatin   7. DM:  - will monitor CBG's  - SSI sensitive  8. Sleep apnea. No on cpap - will monitor O2 saturation - O2 if needed  1. FEN/GI: CHO diet. KVO 2. Prophylaxis: SCD 3. Disposition: pending PT /OT evaluation and GOA  D. Piloto Sherron Flemings Paz PGY-2 FMTS.

## 2013-03-22 NOTE — ED Notes (Signed)
[facility] dispatch called for transport

## 2013-03-22 NOTE — H&P (Signed)
FMTS Attending Admission Note: Renold Don MD Personal pager:  [phone] FPTS Service Pager:  [phone]  I  have seen and examined this patient, reviewed their chart. I have discussed this patient with the resident. I agree with the resident's findings, assessment and care plan.  Additionally:  Pleasant elderly gentleman with 1 month extreme fatigue but smoldering malaise "all winter long."  Has had decrease in ADLs secondary to fatigue -- states getting behind on bills, no wanting to bathe, etc.  Remote history (1992) of prostate cancer.  Granddaughter brought to ED due to general feelings of malaise and fall from bed.  Found on CT scan to have metastases with prostate as likely source  Exam: Gen:  Elderly male, sitting in bed, NAD HEENT: MMM Heart: RRR Lungs:  Clear Abdomen:  S/NT/ND  Imp/Plan: 1.   Malaise - likely secondary to ongoing changes from mets - History of a fall, patient adamant he simply lost his balance.  Family not here this AM.   - Palliative care meeting today.   - Agree withPT/OT  2.  Prostate CA: - likely bony mets from prostate source - Agree with Palliative and pain control.   Tobey Grim, MD 03/22/2013 1:38 PM

## 2013-03-22 NOTE — Progress Notes (Signed)
Pt educated on fall prevention safety plan.  Pt. Compliant with use of bed alarm however pt. Refusing staff to assist with walking pt. to restroom and pt refuses staff to stay with patient.  Pt. Noted to be unsteady on feet and states "I've fallen numerous times at home and don't care if I fall here. I don't need anyone to take me to the bathroom. I've managed for 91 years."

## 2013-03-22 NOTE — Progress Notes (Signed)
Patient ZO:[id number] H Borunda      DOB: 02-21-1919      WUJ:[id number]  We have received your consult  And will get scheduled as quickly as we can for goals of care.   Hildred Mollica L. Ladona Ridgel, MD MBA The Palliative Medicine Team at [facility] Phone: [phone] Pager: [phone]

## 2013-03-22 NOTE — ED Notes (Addendum)
Attempt to  [facility] for transport, dispatch unable to accept d/t code STEMI

## 2013-03-23 DIAGNOSIS — Z515 Encounter for palliative care: Secondary | ICD-10-CM

## 2013-03-23 DIAGNOSIS — R740 Nonspecific elevation of levels of transaminase and lactic acid dehydrogenase [LDH]: Secondary | ICD-10-CM

## 2013-03-23 DIAGNOSIS — R748 Abnormal levels of other serum enzymes: Secondary | ICD-10-CM

## 2013-03-23 DIAGNOSIS — Z9181 History of falling: Secondary | ICD-10-CM

## 2013-03-23 DIAGNOSIS — C7951 Secondary malignant neoplasm of bone: Secondary | ICD-10-CM

## 2013-03-23 DIAGNOSIS — D649 Anemia, unspecified: Secondary | ICD-10-CM

## 2013-03-23 DIAGNOSIS — C61 Malignant neoplasm of prostate: Principal | ICD-10-CM

## 2013-03-23 DIAGNOSIS — R609 Edema, unspecified: Secondary | ICD-10-CM

## 2013-03-23 DIAGNOSIS — C4441 Basal cell carcinoma of skin of scalp and neck: Secondary | ICD-10-CM

## 2013-03-23 DIAGNOSIS — C44621 Squamous cell carcinoma of skin of unspecified upper limb, including shoulder: Secondary | ICD-10-CM

## 2013-03-23 DIAGNOSIS — Z8546 Personal history of malignant neoplasm of prostate: Secondary | ICD-10-CM

## 2013-03-23 DIAGNOSIS — C7952 Secondary malignant neoplasm of bone marrow: Secondary | ICD-10-CM

## 2013-03-23 DIAGNOSIS — R531 Weakness: Secondary | ICD-10-CM

## 2013-03-23 LAB — CBC
HCT: 29.6 % — ABNORMAL LOW (ref 39.0–52.0)
Hemoglobin: 10.4 g/dL — ABNORMAL LOW (ref 13.0–17.0)
MCH: 31.1 pg (ref 26.0–34.0)
MCHC: 35.1 g/dL (ref 30.0–36.0)
MCV: 88.6 fL (ref 78.0–100.0)

## 2013-03-23 LAB — URINE CULTURE: Colony Count: 9000

## 2013-03-23 LAB — BASIC METABOLIC PANEL
BUN: 10 mg/dL (ref 6–23)
Calcium: 8.3 mg/dL — ABNORMAL LOW (ref 8.4–10.5)
Creatinine, Ser: 0.79 mg/dL (ref 0.50–1.35)
GFR calc non Af Amer: 76 mL/min — ABNORMAL LOW (ref >90)
Glucose, Bld: 183 mg/dL — ABNORMAL HIGH (ref 70–99)
Potassium: 4.2 mEq/L (ref 3.5–5.1)

## 2013-03-23 LAB — LACTATE DEHYDROGENASE: LDH: 371 U/L — ABNORMAL HIGH (ref 94–250)

## 2013-03-23 LAB — GLUCOSE, CAPILLARY
Glucose-Capillary: 144 mg/dL — ABNORMAL HIGH (ref 70–99)
Glucose-Capillary: 126 mg/dL — ABNORMAL HIGH (ref 70–99)

## 2013-03-23 MED ORDER — BISACODYL 10 MG RE SUPP: 10.0000 mg | Freq: Every day | RECTAL | Status: DC | PRN

## 2013-03-23 NOTE — Consult Note (Signed)
Patient Peter [name]      DOB: November 26, 1918      JXB:[id number]     Consult Note from the Palliative Medicine Team at [facility]    Consult Requested by: Dr Gwendolyn Grant    PCP: Tobin Chad, MD Reason for Consultation:Clarification of GOC and options     Phone [phone]  Assessment of patients Current state: 79 yo male with multiple co morbidities (hyperlipidima, macular degeneration, OA, DM, pacemaker placement) including h/o prostate cancer/1996, treated with Avodart.  Continued slow functional and physical decline (poor po intake, weight loss, increased frailty) over the past six months.  Admitted on 03-21-13 with increased fatigue and poor po intake.  CT of abdomen and pelvis 03-22-13 showed possible necrotic and cystic mass prostate gland,scattered skeletal lesions suspicious for metastatic disease.  Needed discussion regarding GOC and options   Consult is for review of medical treatment options, clarification of goals of care and end of life issues, disposition and options, and symptom recommendation.  This NP Lorinda Creed reviewed medical records, received report from team, assessed the patient and then meet at the patient's bedside along with his grand-daughter and HPOA Burman Foster # [phone]  to discuss diagnosis prognosis, GOC, EOL wishes disposition and options.   A detailed discussion was had today regarding advanced directives.  Concepts specific to code status, artifical feeding and hydration, continued IV antibiotics and rehospitalization was had.  The difference between a aggressive medical intervention path  and a palliative comfort care path for this patient at this time was had.  Values and goals of care important to patient and family were attempted to be elicited.  Concept of Hospice and Palliative Care were discussed  Natural trajectory and expectations at EOL were discussed.  Questions and concerns addressed.   Family encouraged to call with questions  or concerns.  PMT will continue to support holistically.      Goals of Care: 1.  Code Status:Full code   2. Scope of Treatment: 1. Vital Signs-per unit  2. Respiratory/Oxygen:as needed for tx 3. Nutritional Support/Tube Feeds:undecided 4. Antibiotics:yes as needed for tx 5. Blood Products:as needed for tx 6. IVF:yes as needed for tx 7. Review of Medications to be discontinued:continue all medications  8. Labs:yes 9. Telemetry:yes as needed for tx 10. Consults:Oncology   4. Disposition: Oncology to weigh in before and definitive advanced care planning  Dependant on outcomes, but patient and family are leaning to a disposition home with grand-daughter. Presently family feels home is not a safe place for this patient at this time.   3. Symptom Management:    1. Pain: Morphine 1-2 mg every 2 hrs prn severe pain as written by attending 2. Bowel Regimen: Dulcolax supp 3. Weakness:  PT evaluation and TX  4. Psychosocial: Emotional support offered  to patient and grand-daughter a bedside.  This is very difficult for the patient to verbalize an understanding of huis declineing health and possible increased care needs.   Brief HPI: 79 yo male with multiple co morbidities (hyperlipidima, macular degeneration, OA, DM, pacemaker placement) including h/o prostate cancer/1996, treated with Avodart.  Continued slow functional and physical decline (poor po intake, weight loss, increased frailty) over the past six months.  Admitted on 03-21-13 with increased fatigue and poor po intake.  CT of abdomen and pelvis 03-22-13 showed possible necrotic and cystic mass prostate gland,scattered skeletal lesions suspicious for metastatic disease.  Needed discussion regarding GOC and options    ROS: weakness, poor appetite  PMH:  Past Medical History  Diagnosis Date  . Cardiac septal defect     closed self during childhood  . Blood transfusion     colon polypectomy, bleeding requiring  transfusion  . Hx of colonic polyps 03/03/2005    adenomatous  . Obstructive sleep apnea   . Anemia   . Diabetes mellitus   . Diverticulosis of colon   . GERD without esophagitis   . Hyperlipidemia   . Macular degeneration   . Memory loss of   . Osteoarthritis of spine   . Periumbilical hernia   . Prostate cancer     Urologist watchfully waiting  . Psoriasis   . Chronic venous insufficiency      PSH: Past Surgical History  Procedure Laterality Date  . Tonsillectomy    . Partial colectomy  07/09/02    For Polyps  . Pacemaker placement  02/06/05  . 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 Cataract extraction  6/12    left eye Dr Dione Booze  . Pacemaker replacement  06/2010    Dr Amil Amen   I have reviewed the [facility] and [facility] and  If appropriate update it with new information. No Known Allergies Scheduled Meds: . aspirin EC  81 mg Oral Daily  . insulin aspart  0-9 Units Subcutaneous TID WC  . sodium chloride  3 mL Intravenous Q12H  . sodium chloride  3 mL Intravenous Q12H   Continuous Infusions:  PRN Meds:.sodium chloride, morphine injection, sodium chloride    BP 103/61  Pulse 86  Temp(Src) 98.5 F (36.9 C) (Oral)  Resp 18  Ht 5\' 5"  (1.651 m)  Wt 85.049 kg (187 lb 8 oz)  BMI 31.2 kg/m2  SpO2 96%   PPS:40 %   Intake/Output Summary (Last 24 hours) at 03/23/13 1104 Last data filed at 03/22/13 2140  Gross per 24 hour  Intake    483 ml  Output    150 ml  Net    333 ml     Physical Exam:  General: Chronically ill appearing, NAD HEENT:  Mm, no exudate, poor dentaion Chest:   Diminished in bases, CTA CVS: RRR Abdomen:soft NT +BS Ext: without edema Neuro: alert and oriented X3  Labs: CBC    Component Value Date/Time   WBC 5.2 03/23/2013 0830   RBC 3.34* 03/23/2013 0830   HGB 10.4* 03/23/2013 0830   HCT 29.6* 03/23/2013 0830   PLT 169 03/23/2013 0830   MCV 88.6 03/23/2013 0830   MCH 31.1 03/23/2013 0830   MCHC 35.1 03/23/2013 0830   RDW 13.3 03/23/2013 0830    LYMPHSABS 0.9 08/28/2010 2138   MONOABS 0.4 08/28/2010 2138   EOSABS 0.2 08/28/2010 2138   BASOSABS 0.0 08/28/2010 2138    BMET    Component Value Date/Time   NA 133* 03/23/2013 0830   K 4.2 03/23/2013 0830   CL 97 03/23/2013 0830   CO2 23 03/23/2013 0830   GLUCOSE 183* 03/23/2013 0830   BUN 10 03/23/2013 0830   CREATININE 0.79 03/23/2013 0830   CREATININE 0.99 02/12/2012 1546   CALCIUM 8.3* 03/23/2013 0830   GFRNONAA 76* 03/23/2013 0830   GFRAA 88* 03/23/2013 0830    CMP     Component Value Date/Time   NA 133* 03/23/2013 0830   K 4.2 03/23/2013 0830   CL 97 03/23/2013 0830   CO2 23 03/23/2013 0830   GLUCOSE 183* 03/23/2013 0830   BUN 10 03/23/2013 0830   CREATININE 0.79 03/23/2013  0830   CREATININE 0.99 02/12/2012 1546   CALCIUM 8.3* 03/23/2013 0830   PROT 7.6 03/21/2013 2025   ALBUMIN 3.3* 03/21/2013 2025   AST 74* 03/21/2013 2025   ALT 19 03/21/2013 2025   ALKPHOS 1659* 03/21/2013 2025   BILITOT 0.6 03/21/2013 2025   GFRNONAA 76* 03/23/2013 0830   GFRAA 88* 03/23/2013 0830     Time In Time Out Total Time Spent with Patient Total Overall Time  0830 0950 75 min 80 min    Greater than 50%  of this time was spent counseling and coordinating care related to the above assessment and plan.  Discussed above with Dr Jennette Bill  Lorinda Creed NP  Palliative Medicine Team Team Phone # [phone] Pager [phone]

## 2013-03-23 NOTE — Evaluation (Signed)
Physical Therapy Evaluation Patient Details Name: Peter [name] MRN: [id number] DOB: [birth year] Today's Date: 03/23/2013 Time: 1132-1202 PT Time Calculation (min): 30 min  PT Assessment / Plan / Recommendation Clinical Impression  Pt is a 79 y.o. male with metastatic findings. Patient presents with deficits in functional mobility secondary to decreaed balance and poor safety awareness. Pt will benefit from HHPT evaluation to address functional mobility and safety in the home. Spoke with patient and grand-daughter at length regarding suggestions for mobility and safety. Patient minimally receptive to suggestions made. At this time do not feel patient is safe for indpenence, rec d/c home with family 24/7.    PT Assessment  Patient needs continued PT services    Follow Up Recommendations  Home health PT;Supervision/Assistance - 24 hour          Equipment Recommendations  None recommended by PT    Recommendations for Other Services     Frequency Min 3X/week    Precautions / Restrictions Precautions Precautions: Fall Restrictions Weight Bearing Restrictions: No   Pertinent Vitals/Pain No pain at this time      Mobility  Bed Mobility Bed Mobility: Not assessed Details for Bed Mobility Assistance: Pt sitting in chair/recliner upon arrival Transfers Transfers: Sit to Stand;Stand to Sit Sit to Stand: 4: Min guard;With armrests;From chair/3-in-1;From toilet;4: Min assist Stand to Sit: 4: Min guard;To toilet;To chair/3-in-1 Details for Transfer Assistance: VCs for handplacement Ambulation/Gait Ambulation/Gait Assistance: 4: Min guard Ambulation Distance (Feet): 20 Feet Assistive device: None (uses furniture and counter tops) Ambulation/Gait Assistance Details: patient with instability and multiple balance checks despite reaching for counters.  Gait Pattern: Step-through pattern;Decreased stride length;Trunk flexed;Narrow base of support Gait velocity: decreased General  Gait Details: unsteady Stairs: No    Exercises General Exercises - Lower Extremity Ankle Circles/Pumps: AROM;Both;10 reps Long Arc Quad: AROM;Both;10 reps   PT Diagnosis: Difficulty walking;Abnormality of gait;Generalized weakness;Acute pain  PT Problem List: Decreased range of motion;Decreased activity tolerance;Decreased balance;Decreased mobility;Decreased knowledge of use of DME;Decreased safety awareness PT Treatment Interventions: DME instruction;Gait training;Stair training;Functional mobility training;Therapeutic activities;Therapeutic exercise;Patient/family education   PT Goals Acute Rehab PT Goals PT Goal Formulation: With patient/family Time For Goal Achievement: 04/06/13 Potential to Achieve Goals: Fair Pt will go Sit to Stand: with supervision PT Goal: Sit to Stand - Progress: Goal set today Pt will Ambulate: with supervision PT Goal: Ambulate - Progress: Goal set today Pt will Go Up / Down Stairs: with supervision PT Goal: Up/Down Stairs - Progress: Goal set today  Visit Information  Last PT Received On: 03/23/13    Subjective Data  Subjective: I have been missing appointments and bills and other things   Prior Functioning  Home Living Lives With: Alone Available Help at Discharge: Family Type of Home: House Home Access: Stairs to enter [employer] of Steps: 2 Entrance Stairs-Rails: None Home Layout: Two level;Bed/bath upstairs;Able to live on main level with bedroom/bathroom Alternate Level Stairs-Number of Steps: 14 Bathroom Shower/Tub: Advice worker with back Prior Function Level of Independence: Independent Able to Take Stairs?: Yes Driving: Yes 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 Hand: Right    Cognition  Cognition Arousal/Alertness: Awake/alert Overall Cognitive Status:  (Difficult to assess, pt unaware of need assist devices)     Extremity/Trunk Assessment Right Upper Extremity Assessment RUE ROM/Strength/Tone: WFL for tasks assessed RUE Sensation: WFL - Light Touch;WFL - Proprioception Left Upper Extremity Assessment LUE ROM/Strength/Tone: WFL for tasks assessed LUE Sensation: WFL - Light Touch;WFL -  Proprioception Right Lower Extremity Assessment RLE ROM/Strength/Tone: WFL for tasks assessed RLE Sensation: WFL - Light Touch;WFL - Proprioception Left Lower Extremity Assessment LLE ROM/Strength/Tone: WFL for tasks assessed LLE Sensation: WFL - Light Touch;WFL - Proprioception   Balance Balance Balance Assessed: Yes Static Sitting Balance Static Sitting - Level of Assistance: 6: Modified independent (Device/Increase time) Dynamic Sitting Balance Dynamic Sitting - Balance Support: Feet supported;Right upper extremity supported;During functional activity Static Standing Balance Static Standing - Balance Support: During functional activity (Pt leaning hip on countertop)  End of Session PT - End of Session Equipment Utilized During Treatment: Gait belt Activity Tolerance: Patient tolerated treatment well Patient left: in chair;with call bell/phone within reach;with family/visitor present Nurse Communication: Mobility status  GP     Fabio Asa 03/23/2013, 1:11 PM Charlotte Crumb, PT DPT  [phone]

## 2013-03-23 NOTE — Progress Notes (Signed)
CSW received referral for Hospice and Palliative care. CSW will meet with the pt and/or family to search for placement and to give referrals.  Sherald Barge, LCSW-A Clinical Social Worker [phone]

## 2013-03-23 NOTE — Progress Notes (Signed)
CSW was referred this pt to give them a list of hospice and palliative care information. CSW talked with the pt and the granddaughter about possible SNF placement.  The granddaughter will be taking care of the pt when he goes home.  Palliative care will be put in place for the pt at home.  No further CSW needs.  Sherald Barge, LCSW-A Clinical Social Worker [phone]

## 2013-03-23 NOTE — Evaluation (Signed)
Occupational Therapy Evaluation Patient Details Name: Peter [name] MRN: [id number] DOB: 11/16/1918 Today's Date: 03/23/2013 Time: 1132-1202 OT Time Calculation (min): 30 min  OT Assessment / Plan / Recommendation Clinical Impression  Pt is 79 y/o male, ADM w/ weakness & abd pain, CT findings + mult bone metastasis & necroticlcystic lesion on prostate also concerning for metastatic dx. He presents w/ decreased balance & endurance related to ADL's & functional mobility. He should benefit from OT in acute setting to address deficits, provide pt/family education.    OT Assessment  Patient needs continued OT Services    Follow Up Recommendations  Home health OT;Supervision/Assistance - 24 hour    Barriers to Discharge Other (comment) Pt lives alone & drives, pt's grand daughter plans for pt to go home w/ her for 24/7 assist/supervision. Pt unaware or not acknowledging deficits. Wishes to use bedroom on 2 floor of house, is a Academic librarian of things, w/ multiple hazzards" per grand daughter.  Equipment Recommendations  None recommended by OT    Recommendations for Other Services    Frequency  Min 2X/week    Precautions / Restrictions Precautions Precautions: Fall Restrictions Weight Bearing Restrictions: No   Pertinent Vitals/Pain No c/o pain    ADL  Grooming: Performed;Wash/dry hands;Supervision/safety;Min guard Where Assessed - Grooming: Supported standing;Other (comment) (Leaning/hips on countertop) Upper Body Bathing: Simulated Where Assessed - Upper Body Bathing: Supported sitting Lower Body Bathing: Simulated;Minimal assistance Where Assessed - Lower Body Bathing: Supported sitting Upper Body Dressing: Simulated;Modified independent;Set up Where Assessed - Upper Body Dressing: Supported sitting Lower Body Dressing: Performed;Minimal assistance (Don/doff socks) Where Assessed - Lower Body Dressing: Supported sitting;Supported sit to stand Toilet Transfer: Performed;Min  guard Statistician Method: Sit to Barista: Comfort height toilet;Grab bars Toileting - Architect and Hygiene: Simulated;Min guard Where Assessed - Engineer, mining and Hygiene: Sit to stand from 3-in-1 or toilet;Standing Tub/Shower Transfer Method: Not assessed Equipment Used: Gait belt Transfers/Ambulation Related to ADLs: Pt ambulates in room while reaching/grabbing onto objects nearby (chair, sink, doorframe etc), PT rec. RW, however pt declines use. Overall Min guard-SBA ADL Comments: Pt performed grooming while standing at sink (noted pt leaning hips on sink for support). Pt has h/o multiple falls and reluctant to participate in therapy, declines use of RW for increased safety and balance. Pt also reports that he uses "tongs" at home to "Pick up things that I drop, b/c I've fallen trying to bend over and get something that has fallen."    OT Diagnosis: Generalized weakness  OT Problem List: Decreased strength;Decreased activity tolerance;Impaired balance (sitting and/or standing);Impaired vision/perception;Decreased knowledge of use of DME or AE;Decreased safety awareness OT Treatment Interventions: Self-care/ADL training;DME and/or AE instruction;Therapeutic activities;Patient/family education;Balance training   OT Goals Acute Rehab OT Goals OT Goal Formulation: With patient/family Time For Goal Achievement: 04/06/13 Potential to Achieve Goals: Good ADL Goals Pt Will Perform Grooming: with modified independence;Sitting at sink ADL Goal: Grooming - Progress: Goal set today Pt Will Perform Lower Body Dressing: with modified independence;Sit to stand from chair;Sit to stand from bed;with adaptive equipment ADL Goal: Lower Body Dressing - Progress: Goal set today Pt Will Transfer to Toilet: with supervision;Ambulation;with DME ADL Goal: Toilet Transfer - Progress: Goal set today Pt Will Perform Toileting - Clothing Manipulation: with  supervision;Sitting on 3-in-1 or toilet;Standing ADL Goal: Toileting - Clothing Manipulation - Progress: Goal set today Pt Will Perform Toileting - Hygiene: with modified independence;Sitting on 3-in-1 or toilet ADL Goal: Toileting - Hygiene - Progress:  Goal set today Pt Will Perform Tub/Shower Transfer: with supervision;with DME;Transfer tub bench ADL Goal: Tub/Shower Transfer - Progress: Goal set today  Visit Information  Last OT Received On: 03/23/13    Subjective Data  Subjective: "I appreciate your suggestions, but..." Patient Stated Goal: Return home alone   Prior Functioning     Home Living Lives With: Alone Available Help at Discharge: Family Type of Home: House Home Access: Stairs to enter [employer] of Steps: 2 Entrance Stairs-Rails: None Home Layout: Two level;Bed/bath upstairs;Able to live on main level with bedroom/bathroom Alternate Level Stairs-Number of Steps: 14 Bathroom Shower/Tub: Advice worker with back Prior Function Level of Independence: Independent Able to Take Stairs?: Yes Driving: Yes 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 Hand: Right    Vision/Perception Vision - History Baseline Vision: Other (comment) (Reading/driving glasses per pt grand daughter) Visual History: Macular degeneration Patient Visual Report: No change from baseline   Cognition  Cognition Arousal/Alertness: Awake/alert Overall Cognitive Status:  (Difficult to assess, pt unaware of need assist devices)    Extremity/Trunk Assessment Right Upper Extremity Assessment RUE ROM/Strength/Tone: WFL for tasks assessed RUE Sensation: WFL - Light Touch;WFL - Proprioception Left Upper Extremity Assessment LUE ROM/Strength/Tone: WFL for tasks assessed LUE Sensation: WFL - Light Touch;WFL - Proprioception Right Lower Extremity Assessment RLE ROM/Strength/Tone: WFL for tasks  assessed RLE Sensation: WFL - Light Touch;WFL - Proprioception Left Lower Extremity Assessment LLE ROM/Strength/Tone: WFL for tasks assessed LLE Sensation: WFL - Light Touch;WFL - Proprioception     Mobility Bed Mobility Bed Mobility: Not assessed Details for Bed Mobility Assistance: Pt sitting in chair/recliner upon arrival Transfers Transfers: Sit to Stand;Stand to Sit Sit to Stand: 4: Min guard;With armrests;From chair/3-in-1;From toilet;4: Min assist Stand to Sit: 4: Min guard;To toilet;To chair/3-in-1 Details for Transfer Assistance: VCs for handplacement      Balance Balance Balance Assessed: Yes Static Sitting Balance Static Sitting - Level of Assistance: 6: Modified independent (Device/Increase time) Dynamic Sitting Balance Dynamic Sitting - Balance Support: Feet supported;Right upper extremity supported;During functional activity Static Standing Balance Static Standing - Balance Support: During functional activity (Pt leaning hip on countertop)   End of Session OT - End of Session Equipment Utilized During Treatment: Gait belt;Other (comment) (PT rec RW, however pt declines) Activity Tolerance: Patient tolerated treatment well Patient left: in chair;with call bell/phone within reach;with family/visitor present  GO     Roselie Awkward Dixon 03/23/2013, 1:01 PM

## 2013-03-23 NOTE — Progress Notes (Signed)
FMTS Attending Daily Note:  Renold Don MD  [phone] pager  Family Practice pager:  [phone] I have seen and examined this patient and have reviewed their chart. I have discussed this patient with the resident. I agree with the resident's findings, assessment and care plan.  Additionally:  Awaiting palliative care meeting.  Appreciate oncology input.  Per patient's PCP, lesion on Left arm was actually squamous cell CA, NOT melanoma.    Tobey Grim, MD 03/23/2013 2:51 PM

## 2013-03-23 NOTE — Consult Note (Signed)
[facility]  [phone]    ONCOLOGY  HOSPITAL CONSULTATION NOTE  Peter [name]                                MR#: [id number]  DOB: [birth year]                       CSN#: [id number]  Referring MD: FPTS  PCP: Peter. Zachery [name]   Reason for Consult: Metastatic Bone Lesions   Peter [name] is a delightful  79 y.o. male with mutliple medical issues listed below, including a history of Prostate Cancer, intitially diagnosed in 1996, at tthe time Gleason 5 with BPH and BOO. At the time he was on surveillance due to declining therapy or frequent follow ups. He was placed on Avodart due to a rise on PSA to 28 and breast tenderness, with good response, with a decline of his PSA to 6.15.He Avodart was discontinued . Back in 2010, his PSA rose to 18 for he was placed again on Avodart . In 06/2011 his PSA rose to 33, and to 39.9 on 02/2012. He received Lupron injection under direction of Peter. Annabell [name]. 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  Bone scan on 03/12/2012 was negative for metastatic bony disease.    He was admitted on 03/21/13 via ED with one week history of extreme fatigue and malaise, decreased oral intake, subsequently falling from bed.   CT of the abdomen and pelvis on 03/22/13 showed a  possible necrotic or cystic mass in the right peripheral zone of the prostate gland, with cattered ill-defined sclerotic lesions throughout the skeleton, suspicious for prostate metastatic disease. Pathologically enlarged right external iliac lymph node of 1.3 cm was noted. No CT of the head is available for review.LDH is 479 as of 03/21/2013.No new PSA available for review.  Suspecting metastatic prostate cancer, we were requested to see the patient with recommendations.        PMH:  Past Medical History  Diagnosis Date  . Cardiac septal defect     closed self during childhood  . Blood transfusion     colon  polypectomy, bleeding requiring transfusion  . Hx of colonic polyps 03/03/2005    adenomatous  . Obstructive sleep apnea   . Anemia   . Diabetes mellitus   . Diverticulosis of colon   . GERD without esophagitis   . Hyperlipidemia   . Macular degeneration   . Memory loss of   . Osteoarthritis of spine   . Periumbilical hernia   . Prostate cancer     Urologist watchfully waiting  . Psoriasis   . Chronic venous insufficiency   LUE quamous cel carcinoma with resection by Peter. Terri [name] 11/2012  Surgeries:  Past Surgical History  Procedure Laterality Date  . Tonsillectomy    . Partial colectomy  07/09/02    For Polyps  . Pacemaker placement  02/06/05  . Excision of basal cell carcinoma  of right ear  07/02/06  . Cataract extraction  6/12    left eye Peter [name]  . 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  Allergies: No Known Allergies  Medications:   Prior to Admission:  Prescriptions prior to admission  Medication Sig Dispense Refill  . metFORMIN (  GLUCOPHAGE-XR) 500 MG 24 hr tablet Take 1 tablet (500 mg total) by mouth daily with breakfast.  30 tablet  11  . lisinopril (PRINIVIL,ZESTRIL) 2.5 MG tablet Take 2.5 mg by mouth every morning.      . simvastatin (ZOCOR) 20 MG tablet Take 20 mg by mouth every evening.        ZOX:WRUEAV chloride, morphine injection, sodium chloride  ROS: Constitutional: Positive for 25 lb weight loss since January of 2014. Negative for fever, chills or  night sweats.  Respiratory: Negative for productive cough. No hemoptysis.No hoarseness.No neck swelling.  Positive for shortness of breath on exertion due to chronic sleep apnea. No pleuritic chest pain.  Cardiovascular: Negative for chest pain. No palpitations.  GI: Negative for  nausea, vomiting, diarrhea or constipation. No change in bowel caliber. No  Melena or hematochezia. Negative for  abdominal pain.  GU: Negative for hematuria. No loss of  urinary control. No urinary retention.No changes in urine color.  Skin: Negative for itching. No rash. No petechia. No easy  Bruising. Musculoskeletal:  bone pain.  Neurological: No headaches.No confusion. No motor or sensory deficits.No headaches. Psych: No depression. No suicidal thoughts.  Family History:    Family History  Problem Relation Age of Onset  . Dementia Mother     Died at 67  . Cancer Father   . Cancer Brother   . Cancer Paternal Grandmother   . Kidney failure Maternal Uncle   . Alzheimer's disease Daughter   . COPD Daughter       Social History:  reports that he has quit smoking. His smoking use included Pipe. He has quit using smokeless tobacco. He reports that  drinks alcohol, one beer a day. He reports that he does not use illicit drugs. widower, 3 children. One daughter, estranged since 61. The other 2 daughters live in [place], one of them, Peter [name] has Alzheimers.. Contact in [place] is Peter [name]. 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   Physical Exam    Filed Vitals:   03/23/13 0500  BP: 103/61  Pulse: 86  Temp: 98.5 F (36.9 C)  Resp: 18      General:  54 -year-old  in no acute distress A. and O. x3  well-developed, well-nourished. Sitting in chair. HEENT: Normocephalic, atraumatic, PERRLA. Sclerae anicteric. Oral cavity without thrush or lesions. NECK:supple. no thyromegaly, no cervical or supraclavicular adenopathy  LUNGS: clear bilaterally . No wheezing, rhonchi or rales. No axillary masses. BREASTS: not examined. CARDIOVASCULAR: regular rate and rhythm, no murmur , rubs or gallops ABDOMEN: soft,protruberant  nontender, bowel sounds x4. No HSM. No masses palpable.  GU/rectal: deferred. EXTREMITIES: no clubbing cyanosis or edema. No bruising or petechial rash MUSCULOSKELETAL: no spinal tenderness.  NEURO: Non Focal. No Horner's.   Labs:  CBC   Recent Labs Lab  03/21/13 2025 03/22/13 0930 03/23/13 0830  WBC 5.1 4.1 5.2  HGB 10.7* 9.2* 10.4*  HCT 30.3* 27.0* 29.6*  PLT 206 147* 169  MCV 88.9 89.4 88.6  MCH 31.4 30.5 31.1  MCHC 35.3 34.1 35.1  RDW 13.2 13.4 13.3     CMP    Recent Labs Lab 03/21/13 2025 03/22/13 0930 03/23/13 0830  NA 133* 132* 133*  K 4.4 4.2 4.2  CL 97 97 97  CO2 25 21 23   GLUCOSE 195* 181* 183*  BUN 15 13 10   CREATININE 0.79 0.73 0.79  CALCIUM 9.0 8.3* 8.3*  AST  74*  --   --   ALT 19  --   --   ALKPHOS 1659*  --   --   BILITOT 0.6  --   --      Imaging Studies:  Dg Chest 2 View  03/21/2013   *RADIOLOGY REPORT*  Clinical Data: Weakness.  Diabetes.  CHEST - 2 VIEW  Comparison: 03/21/2013  Findings: Dual lead pacer remains in place.  Mild cardiomegaly noted. Chronically elevated right hemidiaphragm noted with poor definition of the left hemidiaphragm as before.  On the lateral projection, there is subtle posterior blunting of both hemidiaphragms suggesting trace bilateral pleural effusions. Subsegmental atelectasis or scarring noted along the right hemidiaphragm.  No pulmonary edema.  IMPRESSION:  1.  Mild blunting of both posterior costophrenic angles compatible with trace bilateral pleural effusions. 2.  Cardiomegaly, without edema. 3.  Elevated right hemidiaphragm.   Original Report Authenticated By: Gaylyn Rong, M.D.   Ct Abdomen Pelvis W Contrast  03/22/2013   *RADIOLOGY REPORT*  Clinical Data: Weakness.  Reduced appetite.  CT ABDOMEN AND PELVIS WITH CONTRAST  Technique:  Multidetector CT imaging of the abdomen and pelvis was performed following the standard protocol during bolus administration of intravenous contrast.  Contrast: 50mL OMNIPAQUE IOHEXOL 300 MG/ML  SOLN, OMNIPAQUE IOHEXOL 300 MG/ML  SOLN  Comparison: Radiographs from 03/21/2013  Findings: Cardiomegaly observed.  Previously seen blunting of the costophrenic angles appears to be due to mild prominence of pleural adipose tissue and  adjacent subsegmental atelectasis rather than pleural effusions.  Nodular hepatic contour compatible with cirrhosis.  No discrete liver mass identified.  Upper normal splenic size.  Anterior right renal cysts, 2.5 x 2.2 cm, potentially minimally complex. Posterior right renal cyst, 1.5 x 1.3 cm, thought to be simple. Left kidney unremarkable.  Aortoiliac atherosclerotic vascular disease noted.  Appendix not well seen.  Scattered sigmoid diverticula noted without active diverticulitis.  Abnormally symmetric low density along the right peripheral zone of the prostate gland observed on images 74-80 of series 2, concerning for prostate malignancy.  Pathologic right external iliac node, short axis 1.3 cm on image 66 of series to.  Patchy sclerosis noted in the pelvis and lumbar spine, suspicious for osseous metastatic disease.  Similar findings in the right proximal femur.  Bilateral pars defects at L5 noted with 6 mm anterolisthesis and associated bilateral foraminal stenosis, right greater than left.  IMPRESSION:  1.  Possible necrotic or cystic mass in the right peripheral zone of the prostate gland.  Scattered ill-defined sclerotic lesions throughout the skeleton, suspicious for prostate metastatic disease. 2.  Pathologically enlarged right external iliac lymph node, suspicious for malignancy. 3.  Cardiomegaly.  4.  Hepatic cirrhosis without liver mass seen. 5. Benign appearing right renal cysts. 6.  Pars defects at L5 with 6 mm anterolisthesis and right foraminal stenosis.   Original Report Authenticated By: Gaylyn Rong, M.D.   Dg Chest Port 1 View  03/21/2013   *RADIOLOGY REPORT*  Clinical Data: Weakness.  Left-sided chest pain.  PORTABLE CHEST - 1 VIEW  Comparison: 08/28/2010  Findings: Cardiomegaly noted with dual lead pacer in place, lead tip position is unchanged.  Mildly elevated right hemidiaphragm, chronic.  Chronic poor definition of the left hemidiaphragm.  Thoracic spondylosis noted.  IMPRESSION:   1.  Cardiomegaly, without edema. 2.  Chronically elevated right hemidiaphragm. 3.  Chronic poor definition of the left hemidiaphragm likely attributable to diaphragmatic configuration, but reduces negative predictive value for assessment of the left lower lobe on  frontal radiography.   Original Report Authenticated By: Gaylyn Rong, M.D.   Dg Abd 2 Views  03/21/2013   *RADIOLOGY REPORT*  Clinical Data: Abdominal pain and distention  ABDOMEN - 2 VIEW  Comparison: None  Findings: No dilated loops of small bowel identified.  On the decubitus radiograph there are multiple colonic fluid levels.  No abnormal abdominal or pelvic calcifications.  IMPRESSION:  1.  No evidence for small bowel obstruction. 2.  Colonic fluid levels noted.   Original Report Authenticated By: Signa Kell, M.D.      A/P: 79 y.o. male with a history of prostate cancer dating back to 1995, with last dose of Lupron likely in January of 2013. Hist last Bone Scan in 03/2012 had been negative for metastatic disease. He was admitted to [facility] with extreme fatigue and malaise, decreased oral intake, subsequently falling from bed.  CT of the abdomen and pelvis on 03/22/13 showed a  possible necrotic or cystic mass in the right peripheral zone of the prostate gland, with cattered ill-defined sclerotic lesions throughout the skeleton, suspicious for prostate metastatic disease. Pathologically enlarged right external iliac lymph node of 1.3 cm was noted. No CT of the head is available for review.No new tumor markers are available at this time.  Tissue  may be needed to be obtained for definite diagnosis, perhaps from the iliac lymph node, can be done by IR. He may need CT of the head due to patient's recent fall, rule out mets. Consider CT chest to complete staging, as well as PSA (no new PSA available for review) May need to repeat bone scan  to rule out other metastatic bone lesions.  We were kindly requested to evaluate the patient.   Peter.Little Bashore is to see the patient following this consult with recommendations regarding urther workup studies, in order to proceed with potential treatment plan if a candidate. Addendum to this note to be written. Thank you for the referral.  Marcos Eke, PA-C 03/23/2013 11:40 AM  The patient was seen and examined today.  Imaging studies were reviewed.  I spoke to the patient's granddaughter, Peter [name]  [phone] gave me a good description of the patient's very poor and dangerous (for him) living circumstances.  For example, the patient is quite eccentric, lives alone in a 2-story house without heat or air conditioning. The house is unclean and not maintained.  The patient eats canned food.  He continues to drive.  He has frequent falls. He has been poorly compliant with medical followup. Clearly, he needs a supervised living situation or facility.  It is unsafe for him to return home.  He is a poor historian.  I am concerned he may have some dementia.  The patient most likely has metastatic prostate cancer primarily involving bone on the basis of CT scans carried out on 03/22/2013 showing osteosclerotic lesions and the history of prostate cancer dating back to the early to mid 1990s. It is important to note that a bone scan carried out on 03/12/2012 was negative. LDH and alkaline phosphatase this admission are strikingly elevated. The most important piece of information, specifically the PSA drawn today, is currently pending. The medical record shows a PSA of 15.43 on 03/18/2006, 28.5 on 05/17/2008 and the most recent PSA in the record was 12 from 06/21/2008.  At the present time we have very little information about the  management of the patient's prostate cancer or any prior treatment he may have received. His granddaughter thinks that he may have  received Lupron in the past. The patient's urologist is Peter. Bjorn Pippin  [phone]) however he is on vacation and will not be available until  next week. The patient's primary physician is Peter. Deniece Portela A. Hale [phone]). I will try to reach him tomorrow. This evening I spoke with one of the hospitalists caring for the patient.  I have ordered a bone scan and testosterone level for tomorrow..  If the PSA and bone scan confirm the diagnosis of metastatic prostate cancer and the patient has had no significant previous treatment, I would recommend androgen depravation therapy, either Lupron injections preceded by the antiandrogen Casodex or Degarelix, a gonadotropin releasing hormone antagonist.  The patient should also receive an osteoclast inhibitor, such as denosunab or zoledronic acid.  Given the patient's history of falling, it would probably be a good idea to obtain a CT scan of the brain to rule out a chronic subdural hematoma.   If the patient is agreeable, we will be happy to follow-up with him as an outpatient.   Samul Dada 03/23/2013 7:22 PM

## 2013-03-23 NOTE — Progress Notes (Signed)
Family Medicine Teaching Service Daily Progress Note Service Page: [phone]  Subjective: patient feeling ok this morning. States ate more this morning than in the past several weeks. Notes that he had a skin lesion removed from his left tricep last year and believed it was melanoma.  Objective: Temp:  [98.5 F (36.9 C)-98.6 F (37 C)] 98.5 F (36.9 C) (05/19 0500) Pulse Rate:  [80-88] 86 (05/19 0500) Resp:  [18-20] 18 (05/19 0500) BP: (92-103)/(53-61) 103/61 mmHg (05/19 0500) SpO2:  [96 %-99 %] 96 % (05/19 0500) Weight:  [187 lb 8 oz (85.049 kg)] 187 lb 8 oz (85.049 kg) (05/19 0500) Exam: General: NAD, sitting up in bed Cardiovascular: rrr, no mrg Respiratory: CTAB, no wheezes or crackles Abdomen: s, NT, ND Extremities: no edema  CBC BMET   Recent Labs Lab 03/21/13 2025 03/22/13 0930 03/23/13 0830  WBC 5.1 4.1 5.2  HGB 10.7* 9.2* 10.4*  HCT 30.3* 27.0* 29.6*  PLT 206 147* 169    Recent Labs Lab 03/21/13 2025 03/22/13 0930  NA 133* 132*  K 4.4 4.2  CL 97 97  CO2 25 21  BUN 15 13  CREATININE 0.79 0.73  GLUCOSE 195* 181*  CALCIUM 9.0 8.3*     Results for orders placed during the hospital encounter of 03/21/13 (from the past 24 hour(s))  BASIC METABOLIC PANEL     Status: Abnormal   Collection Time    03/22/13  9:30 AM      Result Value Range   Sodium 132 (*) 135 - 145 mEq/L   Potassium 4.2  3.5 - 5.1 mEq/L   Chloride 97  96 - 112 mEq/L   CO2 21  19 - 32 mEq/L   Glucose, Bld 181 (*) 70 - 99 mg/dL   BUN 13  6 - 23 mg/dL   Creatinine, Ser 9.56  0.50 - 1.35 mg/dL   Calcium 8.3 (*) 8.4 - 10.5 mg/dL   GFR calc non Af Amer 79 (*) >90 mL/min   GFR calc Af Amer >90  >90 mL/min  CBC     Status: Abnormal   Collection Time    03/22/13  9:30 AM      Result Value Range   WBC 4.1  4.0 - 10.5 K/uL   RBC 3.02 (*) 4.22 - 5.81 MIL/uL   Hemoglobin 9.2 (*) 13.0 - 17.0 g/dL   HCT 21.3 (*) 08.6 - 57.8 %   MCV 89.4  78.0 - 100.0 fL   MCH 30.5  26.0 - 34.0 pg   MCHC 34.1   30.0 - 36.0 g/dL   RDW 46.9  62.9 - 52.8 %   Platelets 147 (*) 150 - 400 K/uL  GLUCOSE, CAPILLARY     Status: Abnormal   Collection Time    03/22/13 11:35 AM      Result Value Range   Glucose-Capillary 134 (*) 70 - 99 mg/dL  GLUCOSE, CAPILLARY     Status: Abnormal   Collection Time    03/22/13  4:38 PM      Result Value Range   Glucose-Capillary 170 (*) 70 - 99 mg/dL  GLUCOSE, CAPILLARY     Status: Abnormal   Collection Time    03/22/13  9:09 PM      Result Value Range   Glucose-Capillary 133 (*) 70 - 99 mg/dL  GLUCOSE, CAPILLARY     Status: Abnormal   Collection Time    03/23/13  7:31 AM      Result Value Range  Glucose-Capillary 142 (*) 70 - 99 mg/dL  CBC     Status: Abnormal   Collection Time    03/23/13  8:30 AM      Result Value Range   WBC 5.2  4.0 - 10.5 K/uL   RBC 3.34 (*) 4.22 - 5.81 MIL/uL   Hemoglobin 10.4 (*) 13.0 - 17.0 g/dL   HCT 40.9 (*) 81.1 - 91.4 %   MCV 88.6  78.0 - 100.0 fL   MCH 31.1  26.0 - 34.0 pg   MCHC 35.1  30.0 - 36.0 g/dL   RDW 78.2  95.6 - 21.3 %   Platelets 169  150 - 400 K/uL     Imaging/Diagnostic Tests: Dg Chest 2 View  03/21/2013   *RADIOLOGY REPORT*  Clinical Data: Weakness.  Diabetes.  CHEST - 2 VIEW  Comparison: 03/21/2013  Findings: Dual lead pacer remains in place.  Mild cardiomegaly noted. Chronically elevated right hemidiaphragm noted with poor definition of the left hemidiaphragm as before.  On the lateral projection, there is subtle posterior blunting of both hemidiaphragms suggesting trace bilateral pleural effusions. Subsegmental atelectasis or scarring noted along the right hemidiaphragm.  No pulmonary edema.  IMPRESSION:  1.  Mild blunting of both posterior costophrenic angles compatible with trace bilateral pleural effusions. 2.  Cardiomegaly, without edema. 3.  Elevated right hemidiaphragm.   Original Report Authenticated By: Gaylyn Rong, M.D.   Ct Abdomen Pelvis W Contrast  03/22/2013   *RADIOLOGY REPORT*  Clinical  Data: Weakness.  Reduced appetite.  CT ABDOMEN AND PELVIS WITH CONTRAST  Technique:  Multidetector CT imaging of the abdomen and pelvis was performed following the standard protocol during bolus administration of intravenous contrast.  Contrast: 50mL OMNIPAQUE IOHEXOL 300 MG/ML  SOLN, OMNIPAQUE IOHEXOL 300 MG/ML  SOLN  Comparison: Radiographs from 03/21/2013  Findings: Cardiomegaly observed.  Previously seen blunting of the costophrenic angles appears to be due to mild prominence of pleural adipose tissue and adjacent subsegmental atelectasis rather than pleural effusions.  Nodular hepatic contour compatible with cirrhosis.  No discrete liver mass identified.  Upper normal splenic size.  Anterior right renal cysts, 2.5 x 2.2 cm, potentially minimally complex. Posterior right renal cyst, 1.5 x 1.3 cm, thought to be simple. Left kidney unremarkable.  Aortoiliac atherosclerotic vascular disease noted.  Appendix not well seen.  Scattered sigmoid diverticula noted without active diverticulitis.  Abnormally symmetric low density along the right peripheral zone of the prostate gland observed on images 74-80 of series 2, concerning for prostate malignancy.  Pathologic right external iliac node, short axis 1.3 cm on image 66 of series to.  Patchy sclerosis noted in the pelvis and lumbar spine, suspicious for osseous metastatic disease.  Similar findings in the right proximal femur.  Bilateral pars defects at L5 noted with 6 mm anterolisthesis and associated bilateral foraminal stenosis, right greater than left.  IMPRESSION:  1.  Possible necrotic or cystic mass in the right peripheral zone of the prostate gland.  Scattered ill-defined sclerotic lesions throughout the skeleton, suspicious for prostate metastatic disease. 2.  Pathologically enlarged right external iliac lymph node, suspicious for malignancy. 3.  Cardiomegaly.  4.  Hepatic cirrhosis without liver mass seen. 5. Benign appearing right renal cysts. 6.  Pars  defects at L5 with 6 mm anterolisthesis and right foraminal stenosis.   Original Report Authenticated By: Gaylyn Rong, M.D.   Dg Chest Port 1 View  03/21/2013   *RADIOLOGY REPORT*  Clinical Data: Weakness.  Left-sided chest pain.  PORTABLE CHEST -  1 VIEW  Comparison: 08/28/2010  Findings: Cardiomegaly noted with dual lead pacer in place, lead tip position is unchanged.  Mildly elevated right hemidiaphragm, chronic.  Chronic poor definition of the left hemidiaphragm.  Thoracic spondylosis noted.  IMPRESSION:  1.  Cardiomegaly, without edema. 2.  Chronically elevated right hemidiaphragm. 3.  Chronic poor definition of the left hemidiaphragm likely attributable to diaphragmatic configuration, but reduces negative predictive value for assessment of the left lower lobe on frontal radiography.   Original Report Authenticated By: Gaylyn Rong, M.D.   Dg Abd 2 Views  03/21/2013   *RADIOLOGY REPORT*  Clinical Data: Abdominal pain and distention  ABDOMEN - 2 VIEW  Comparison: None  Findings: No dilated loops of small bowel identified.  On the decubitus radiograph there are multiple colonic fluid levels.  No abnormal abdominal or pelvic calcifications.  IMPRESSION:  1.  No evidence for small bowel obstruction. 2.  Colonic fluid levels noted.   Original Report Authenticated By: Signa Kell, M.D.     Assessment/Plan: JD MCCASTER is a 79 y.o. male with PMX significant for prostate Ca and skin Ca, DM, HTN, HLD, Anemia, and Sleep Apnea is admitted with weakness and abdominal pain. CT finding positive for multiple bone metastasis, and necrotic/cystic lesion on Prostate also concerning for metastatic disease.   1. Weakness: Decreased oral intake and difficulty taking care for himself for one week duration. No problems with appetite but rather with ability and desire to self prepare his meals. (pt does not cook, he eats at restaurants and due to weakness did not feel like going out). Appetite improved  today.  - PT/OT to evaluate  -weakness likely related to metastatic changes  2. Prostate Ca: now with elevated Alk phos and lesions on CT that are consistent with bone met and possible necrotic met on prostate.  - new findings discussed with pt on admission-patient aware this is likely critical disease  - pain management with morphine PRN  - Palliative care consult will be able to help setting GOC with pt's terminal condition  3. HTN: BP continues to be low side of normal - will hold lisinopril  - monitor BP   4. Hx arrythmia s/p pacemaker: no events on tele -admitted to telemetry  -continue to monitor   5. Anemia: stable at 10.7 for a year. Iron panel last year was wnl. Most likely secondary to chronic disease  -continues to be stable -will continue to monitor  6. HLD: on simvastatin, AST to 74 - hold simvastatin with elevation in AST  7. DM: CBGs 133-170 - will monitor CBG's  - SSI sensitive   8. Sleep apnea: Not on cpap  - will monitor O2 saturation  - O2 if needed   FEN/GI: CHO diet. KVO Prophylaxis: SCD Disposition: pending PT /OT evaluation and Markus Jarvis, MD 03/23/2013, 9:12 AM

## 2013-03-24 ENCOUNTER — Encounter (HOSPITAL_COMMUNITY): Payer: Medicare Other

## 2013-03-24 ENCOUNTER — Inpatient Hospital Stay (HOSPITAL_COMMUNITY): Payer: Medicare Other

## 2013-03-24 DIAGNOSIS — N400 Enlarged prostate without lower urinary tract symptoms: Secondary | ICD-10-CM

## 2013-03-24 DIAGNOSIS — R7989 Other specified abnormal findings of blood chemistry: Secondary | ICD-10-CM

## 2013-03-24 DIAGNOSIS — E291 Testicular hypofunction: Secondary | ICD-10-CM

## 2013-03-24 DIAGNOSIS — E785 Hyperlipidemia, unspecified: Secondary | ICD-10-CM

## 2013-03-24 DIAGNOSIS — R972 Elevated prostate specific antigen [PSA]: Secondary | ICD-10-CM

## 2013-03-24 LAB — CBC
Hemoglobin: 9.6 g/dL — ABNORMAL LOW (ref 13.0–17.0)
MCH: 30.9 pg (ref 26.0–34.0)
MCHC: 34.5 g/dL (ref 30.0–36.0)
Platelets: 148 10*3/uL — ABNORMAL LOW (ref 150–400)
RBC: 3.11 MIL/uL — ABNORMAL LOW (ref 4.22–5.81)

## 2013-03-24 LAB — GLUCOSE, CAPILLARY
Glucose-Capillary: 131 mg/dL — ABNORMAL HIGH (ref 70–99)
Glucose-Capillary: 125 mg/dL — ABNORMAL HIGH (ref 70–99)

## 2013-03-24 LAB — BASIC METABOLIC PANEL
Calcium: 8.4 mg/dL (ref 8.4–10.5)
GFR calc non Af Amer: 77 mL/min — ABNORMAL LOW (ref >90)
Glucose, Bld: 150 mg/dL — ABNORMAL HIGH (ref 70–99)
Potassium: 4.3 mEq/L (ref 3.5–5.1)
Sodium: 136 mEq/L (ref 135–145)

## 2013-03-24 LAB — TESTOSTERONE: Testosterone: 186 ng/dL — ABNORMAL LOW (ref 300–890)

## 2013-03-24 MED ORDER — TECHNETIUM TC 99M MEDRONATE IV KIT
25.0000 | PACK | Freq: Once | INTRAVENOUS | Status: AC | PRN
  Administered 2013-03-24: 25 via INTRAVENOUS

## 2013-03-24 MED ORDER — ACETAMINOPHEN 325 MG PO TABS
650.0000 mg | ORAL_TABLET | ORAL | Status: DC | PRN
  Administered 2013-03-24: 650 mg via ORAL
  Filled 2013-03-24: qty 2

## 2013-03-24 MED ORDER — DEGARELIX ACETATE 120 MG ~~LOC~~ SOLR
240.0000 mg | Freq: Once | SUBCUTANEOUS | Status: AC
  Administered 2013-03-25: 240 mg via SUBCUTANEOUS
  Filled 2013-03-24: qty 6

## 2013-03-24 NOTE — Progress Notes (Signed)
Family Medicine Teaching Service Daily Progress Note Service Page: [phone]  Subjective: doing well. No complaints of pain this morning. Initially states has no issues with urination, then states has issues controlling flow with intermittent leakage.  Objective: Temp:  [97.9 F (36.6 C)-98.8 F (37.1 C)] 98.7 F (37.1 C) (05/20 0300) Pulse Rate:  [84-93] 93 (05/20 0608) Resp:  [18] 18 (05/20 0300) BP: (113-120)/(53-58) 118/53 mmHg (05/20 0300) SpO2:  [96 %-99 %] 99 % (05/20 0300) Weight:  [193 lb 1.6 oz (87.59 kg)] 193 lb 1.6 oz (87.59 kg) (05/20 0300) Exam: General: NAD, sitting up in bed Cardiovascular: rrr, no mrg Respiratory: CTAB, no wheezes or crackles Abdomen: s, NT, ND Extremities: no edema  CBC BMET   Recent Labs Lab 03/22/13 0930 03/23/13 0830 03/24/13 0546  WBC 4.1 5.2 4.3  HGB 9.2* 10.4* 9.6*  HCT 27.0* 29.6* 27.8*  PLT 147* 169 148*    Recent Labs Lab 03/22/13 0930 03/23/13 0830 03/24/13 0546  NA 132* 133* 136  K 4.2 4.2 4.3  CL 97 97 101  CO2 21 23 29   BUN 13 10 10   CREATININE 0.73 0.79 0.76  GLUCOSE 181* 183* 150*  CALCIUM 8.3* 8.3* 8.4     Results for orders placed during the hospital encounter of 03/21/13 (from the past 24 hour(s))  GLUCOSE, CAPILLARY     Status: Abnormal   Collection Time    03/23/13  7:31 AM      Result Value Range   Glucose-Capillary 142 (*) 70 - 99 mg/dL  CBC     Status: Abnormal   Collection Time    03/23/13  8:30 AM      Result Value Range   WBC 5.2  4.0 - 10.5 K/uL   RBC 3.34 (*) 4.22 - 5.81 MIL/uL   Hemoglobin 10.4 (*) 13.0 - 17.0 g/dL   HCT 30.8 (*) 65.7 - 84.6 %   MCV 88.6  78.0 - 100.0 fL   MCH 31.1  26.0 - 34.0 pg   MCHC 35.1  30.0 - 36.0 g/dL   RDW 96.2  95.2 - 84.1 %   Platelets 169  150 - 400 K/uL  BASIC METABOLIC PANEL     Status: Abnormal   Collection Time    03/23/13  8:30 AM      Result Value Range   Sodium 133 (*) 135 - 145 mEq/L   Potassium 4.2  3.5 - 5.1 mEq/L   Chloride 97  96 - 112  mEq/L   CO2 23  19 - 32 mEq/L   Glucose, Bld 183 (*) 70 - 99 mg/dL   BUN 10  6 - 23 mg/dL   Creatinine, Ser 3.24  0.50 - 1.35 mg/dL   Calcium 8.3 (*) 8.4 - 10.5 mg/dL   GFR calc non Af Amer 76 (*) >90 mL/min   GFR calc Af Amer 88 (*) >90 mL/min  GLUCOSE, CAPILLARY     Status: Abnormal   Collection Time    03/23/13 11:58 AM      Result Value Range   Glucose-Capillary 144 (*) 70 - 99 mg/dL  LACTATE DEHYDROGENASE     Status: Abnormal   Collection Time    03/23/13  2:37 PM      Result Value Range   LDH 371 (*) 94 - 250 U/L  PSA     Status: Abnormal   Collection Time    03/23/13  2:37 PM      Result Value Range   PSA >5000.00 (*) <=  4.00 ng/mL  GLUCOSE, CAPILLARY     Status: Abnormal   Collection Time    03/23/13  4:36 PM      Result Value Range   Glucose-Capillary 151 (*) 70 - 99 mg/dL  GLUCOSE, CAPILLARY     Status: Abnormal   Collection Time    03/23/13 10:33 PM      Result Value Range   Glucose-Capillary 126 (*) 70 - 99 mg/dL  CBC     Status: Abnormal   Collection Time    03/24/13  5:46 AM      Result Value Range   WBC 4.3  4.0 - 10.5 K/uL   RBC 3.11 (*) 4.22 - 5.81 MIL/uL   Hemoglobin 9.6 (*) 13.0 - 17.0 g/dL   HCT 16.1 (*) 09.6 - 04.5 %   MCV 89.4  78.0 - 100.0 fL   MCH 30.9  26.0 - 34.0 pg   MCHC 34.5  30.0 - 36.0 g/dL   RDW 40.9  81.1 - 91.4 %   Platelets 148 (*) 150 - 400 K/uL  BASIC METABOLIC PANEL     Status: Abnormal   Collection Time    03/24/13  5:46 AM      Result Value Range   Sodium 136  135 - 145 mEq/L   Potassium 4.3  3.5 - 5.1 mEq/L   Chloride 101  96 - 112 mEq/L   CO2 29  19 - 32 mEq/L   Glucose, Bld 150 (*) 70 - 99 mg/dL   BUN 10  6 - 23 mg/dL   Creatinine, Ser 7.82  0.50 - 1.35 mg/dL   Calcium 8.4  8.4 - 95.6 mg/dL   GFR calc non Af Amer 77 (*) >90 mL/min   GFR calc Af Amer 90 (*) >90 mL/min     Imaging/Diagnostic Tests: No results found.   Assessment/Plan: DEKARI BURES is a 79 y.o. male with PMX significant for prostate Ca  and skin Ca, DM, HTN, HLD, Anemia, and Sleep Apnea is admitted with weakness and abdominal pain. CT finding positive for multiple bone metastasis, and necrotic/cystic lesion on Prostate also concerning for metastatic disease.   1. Weakness: Decreased oral intake and difficulty taking care for himself for one week duration. No problems with appetite but rather with ability and desire to self prepare his meals. (pt does not cook, he eats at restaurants and due to weakness did not feel like going out). Appetite improved today.  - PT/OT rec HH PT/OT with 24 hr supervision  - weakness likely related to metastatic changes - given history of falls will obtain CT head w/o contrast to evaluate for chronic subdural  2. Prostate Ca: now with elevated Alk phos and lesions on CT that are consistent with bone met and possible necrotic met on prostate. PSA >5000. - new findings discussed with pt on admission  - pain management with morphine PRN-has not required any prns - Palliative care consult-full care - onc c/s-appreciate their help in this case-will await further recs  -states if has not had previous treatment can do combination of lupron preceded by antiandrogen casodex  or degarelix plus an osteoclast inhibitor denosumab or zoledronic acid  -will touch base with urologist to determine if has had any previous treatment  3. HTN: BP in normal range - will hold lisinopril  - monitor BP   4. Hx arrythmia s/p pacemaker: no events on tele -admitted to telemetry  -continue to monitor   5. Anemia: stable at 10.7 for  a year. Iron panel last year was wnl. Most likely secondary to chronic disease  -continues to be stable -will continue to monitor  6. HLD: on simvastatin, AST to 74 - hold simvastatin with elevation in AST  7. DM: CBGs 131-151 - will monitor CBG's  - SSI sensitive   8. Sleep apnea: Not on cpap  - will monitor O2 saturation  - O2 if needed   FEN/GI: CHO diet. KVO Prophylaxis:  SCD Disposition: potential d/c today after bone scan  Marikay Alar, MD 03/24/2013, 7:26 AM

## 2013-03-24 NOTE — Progress Notes (Signed)
Patient evaluated for community based chronic disease management services with [facility] Care Management Program as a benefit of patient's [employer].  Spoke with patient at bedside to explain [facility] Care Management services.  Patient had some noted confusion about the focus of services.  Agreed that he would present the [facility] literature to his grand daughter [name] this afternoon.  They will decide if he may benefit from [facility] support. Left contact information and [facility] literature at bedside. Made inpatient Case Manager, Tomi Bamberger RN, aware that [facility] Care Management had consulted. Of note, [facility] Care Management services does not replace or interfere with any services that are arranged by inpatient case management or social work.  For additional questions or referrals please contact Anibal Henderson BSN RN [facility] Liaison at [phone].

## 2013-03-24 NOTE — Care Management Note (Signed)
    Page 1 of 2   03/25/2013     10:28:11 AM   CARE MANAGEMENT NOTE 03/25/2013  Patient:  Peter [name], Peter [name]   Account Number:  [id number]  Date Initiated:  03/24/2013  Documentation initiated by:  GRAVES-BIGELOW,Kavin Weckwerth  Subjective/Objective Assessment:   Pt admitted with AMS, FTT. Pt to have a bone scan today. CM did contact the granddaughter [name] and pt will be d/c to her home when medically stable.     Action/Plan:   CM will leave a HH agency list in the pt's room in order for them to choose services.   Anticipated DC Date:  03/25/2013   Anticipated DC Plan:  HOME W HOME HEALTH SERVICES      DC Planning Services  CM consult      [facility] Choice  HOME HEALTH   Choice offered to / List presented to:  C-1 Patient        HH arranged  HH-1 RN  HH-10 DISEASE MANAGEMENT  HH-2 PT  HH-3 OT      [facility] agency  [facility]   Status of service:  Completed, signed off Medicare Important Message given?   (If response is "NO", the following Medicare IM given date fields will be blank) Date Medicare IM given:   Date Additional Medicare IM given:    Discharge Disposition:  HOME W HOME HEALTH SERVICES  Per UR Regulation:  Reviewed for med. necessity/level of care/duration of stay  If discussed at Long Length of Stay Meetings, dates discussed:    Comments:  03-25-13 1022 Tomi Bamberger, RN,BSN [phone] Pt/Granddaughter chose [facility] for services. CM will make referral. SOC to begin within 24-48 hours post d/c.

## 2013-03-24 NOTE — Progress Notes (Signed)
Palliative Medicine Team SW Pt discussed in PMT rounds, referred for psychosocial support. Extensive supportive visit, provided opportunity for pt to engage in life review and reminiscence therapy. 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 Pt expressed his difficulty accepting assistance from his granddtr [name] and her husband. Pt reports understanding of concerns related to his current living environment, but desire to maintain respectful boundaries with caregivers and with process of sorting through his belongings. We discussed ways for pt to express his emotional needs to his family in the coming days. Affirmed the difficulty with "asking for help" when one has been staunchly independent for the majority of their life. Pt thankful for visit, agreeable to follow up.   Kennieth Francois, LCSWA PMT phone [phone] Pager [phone]

## 2013-03-24 NOTE — Progress Notes (Signed)
Progress Note from the Palliative Medicine Team at [facility]  Subjective: Patient is alert and oriented X3, fully engaged in conversation regarding his presetn medical situation and decisions that need to be addressed  -call place to [facility], grand daughter and also discussed above   Objective: No Known Allergies Scheduled Meds: . aspirin EC  81 mg Oral Daily  . insulin aspart  0-9 Units Subcutaneous TID WC  . sodium chloride  3 mL Intravenous Q12H  . sodium chloride  3 mL Intravenous Q12H   Continuous Infusions:  PRN Meds:.sodium chloride, bisacodyl, morphine injection, sodium chloride  BP 118/53  Pulse 93  Temp(Src) 98.7 F (37.1 C) (Oral)  Resp 18  Ht 5\' 5"  (1.651 m)  Wt 87.59 kg (193 lb 1.6 oz)  BMI 32.13 kg/m2  SpO2 99%   PPS: 40 % at best  Pain Score:denies    Intake/Output Summary (Last 24 hours) at 03/24/13 1425 Last data filed at 03/24/13 1300  Gross per 24 hour  Intake    720 ml  Output    775 ml  Net    -55 ml       Physical Exam:  General: chronically ill appearing  NAD,frail HEENT:  Mm, no exudate, poor dentation Chest:   CTA CVS: RRR Abdomen:soft NT +BS Ext: without edema Neuro:alert and oriented X3  Labs: CBC    Component Value Date/Time   WBC 4.3 03/24/2013 0546   RBC 3.11* 03/24/2013 0546   HGB 9.6* 03/24/2013 0546   HCT 27.8* 03/24/2013 0546   PLT 148* 03/24/2013 0546   MCV 89.4 03/24/2013 0546   MCH 30.9 03/24/2013 0546   MCHC 34.5 03/24/2013 0546   RDW 13.4 03/24/2013 0546   LYMPHSABS 0.9 08/28/2010 2138   MONOABS 0.4 08/28/2010 2138   EOSABS 0.2 08/28/2010 2138   BASOSABS 0.0 08/28/2010 2138    BMET    Component Value Date/Time   NA 136 03/24/2013 0546   K 4.3 03/24/2013 0546   CL 101 03/24/2013 0546   CO2 29 03/24/2013 0546   GLUCOSE 150* 03/24/2013 0546   BUN 10 03/24/2013 0546   CREATININE 0.76 03/24/2013 0546   CREATININE 0.99 02/12/2012 1546   CALCIUM 8.4 03/24/2013 0546   GFRNONAA 77* 03/24/2013 0546   GFRAA 90* 03/24/2013 0546     CMP     Component Value Date/Time   NA 136 03/24/2013 0546   K 4.3 03/24/2013 0546   CL 101 03/24/2013 0546   CO2 29 03/24/2013 0546   GLUCOSE 150* 03/24/2013 0546   BUN 10 03/24/2013 0546   CREATININE 0.76 03/24/2013 0546   CREATININE 0.99 02/12/2012 1546   CALCIUM 8.4 03/24/2013 0546   PROT 7.6 03/21/2013 2025   ALBUMIN 3.3* 03/21/2013 2025   AST 74* 03/21/2013 2025   ALT 19 03/21/2013 2025   ALKPHOS 1659* 03/21/2013 2025   BILITOT 0.6 03/21/2013 2025   GFRNONAA 77* 03/24/2013 0546   GFRAA 90* 03/24/2013 0546   Assessment and Plan: 1. Code Status: Full code 2. Symptom Control:           Weakness: recommend home heath for PT/OT   3. Psycho/Social: emotional support to patient and his family, this is a difficult situation for all.  Veronica Lett LCSW with PMT working with patient for psychosocial support  4. Disposition: Patient is open to dc home with grand-daughter with home health involvement.  He plans on outpatient follow-up with oncology.   Time In Time Out Total Time Spent with Patient  Total Overall Time  1430 1505 35 min 35 min    Greater than 50%  of this time was spent counseling and coordinating care related to the above assessment and plan.  Lorinda Creed NP  Palliative Medicine Team Team Phone # [phone] Pager [phone]   1

## 2013-03-24 NOTE — Progress Notes (Signed)
FMTS Attending Daily Note:  Peter Don MD  [phone] pager  Family Practice pager:  [phone] I have seen and examined this patient and have reviewed their chart. I have discussed this patient with the resident. I agree with the resident's findings, assessment and care plan.  Additionally:  Grateful for Oncology input.  Hopeful that we can find some treatment for him.  Sounds like he is functional at home.  Bone scan today, possibly can even be DC'ed later today if everything can be arranged prior.   Tobey Grim, MD 03/24/2013 9:51 AM

## 2013-03-24 NOTE — Progress Notes (Signed)
Peter [name] was seen this evening to discuss the results of recent tests which confirm the diagnosis of metastatic prostate cancer and to discuss my recommendations for therapy. After our discussion, he gave his consent to begin treatment.  In view of his poor dentition, I think it is unwise for him to receive an osteoclast inhibitor due to the risk of osteonecrosis of the jaw.  Review of records from Dr. Bjorn Pippin, patient's urologist, indicate that the patient has never received any treatment for his prostate cancer.  Dr. Belva Crome last note was dated 02/11/2012 in which he states that the patient was "on surveillance and has declined therapy or frequent f/u."  A return appointment was to have been in 6 months.  -PSA >5000 -Bone scan--extensive oseous metastatic disease (b/s from a year ago 03/12/2012 was completely normal).  Thus patient has rapidly progressing, aggressive disease. -testosterone level  186 (300-890) -brain CT---atrophy and microvascular ischemia.     Will order Degarelix  Deborra Medina)  240 mg subcutaneous (a  loading dose), to be followed by 80 mg subcutaneous every 4 weeks.   The patient was advised that he needs ongoing treatment and followup. I will need to see him in about 1 month.  At some point in the future, we will need to review with him end-of-life issues, living will and CODE STATUS.

## 2013-03-25 NOTE — Progress Notes (Signed)
Family Medicine Teaching Service Daily Progress Note Service Page: [phone]  Subjective: continues to do well. Feels better this morning. States he is ready to get his shot and then be d/c'd.  Objective: Temp:  [97.8 F (36.6 C)-100.1 F (37.8 C)] 98.4 F (36.9 C) (05/21 0500) Pulse Rate:  [72-82] 72 (05/21 0500) Resp:  [18] 18 (05/21 0500) BP: (115-125)/(63-67) 115/63 mmHg (05/21 0500) SpO2:  [95 %-98 %] 95 % (05/21 0500) Weight:  [182 lb 9.6 oz (82.827 kg)] 182 lb 9.6 oz (82.827 kg) (05/21 0500) Exam: General: NAD, laying in bed Cardiovascular: rrr, no mrg Respiratory: CTAB, no wheezes or crackles Abdomen: s, NT, ND Extremities: no edema  CBC BMET   Recent Labs Lab 03/22/13 0930 03/23/13 0830 03/24/13 0546  WBC 4.1 5.2 4.3  HGB 9.2* 10.4* 9.6*  HCT 27.0* 29.6* 27.8*  PLT 147* 169 148*    Recent Labs Lab 03/22/13 0930 03/23/13 0830 03/24/13 0546  NA 132* 133* 136  K 4.2 4.2 4.3  CL 97 97 101  CO2 21 23 29   BUN 13 10 10   CREATININE 0.73 0.79 0.76  GLUCOSE 181* 183* 150*  CALCIUM 8.3* 8.3* 8.4     Results for orders placed during the hospital encounter of 03/21/13 (from the past 24 hour(s))  GLUCOSE, CAPILLARY     Status: Abnormal   Collection Time    03/24/13  4:42 PM      Result Value Range   Glucose-Capillary 125 (*) 70 - 99 mg/dL   Comment 1 Notify RN    GLUCOSE, CAPILLARY     Status: Abnormal   Collection Time    03/24/13  9:30 PM      Result Value Range   Glucose-Capillary 250 (*) 70 - 99 mg/dL   Comment 1 Notify RN       Imaging/Diagnostic Tests: Ct Head Wo Contrast  03/24/2013   *RADIOLOGY REPORT*  Clinical Data: Chronic falls  CT HEAD WITHOUT CONTRAST  Technique:  Contiguous axial images were obtained from the base of the skull through the vertex without contrast.  Comparison: CT 04/25/2005  Findings: Moderate atrophy.  Chronic microvascular ischemic change in the white matter.  No acute infarct, hemorrhage, or mass.  No edema or midline  shift.  The calvarium is intact.  Mild mucosal edema in the maxillary sinuses.  IMPRESSION: Atrophy and chronic microvascular ischemia.  No acute intracranial abnormality.   Original Report Authenticated By: Janeece Riggers, M.D.   Nm Bone Scan Whole Body  03/24/2013   *RADIOLOGY REPORT*  Clinical Data: History of bone lesions demonstrated on previous CT. History of prostatic carcinoma.  Pain and cervical area of spine.  NUCLEAR MEDICINE WHOLE BODY BONE SCINTIGRAPHY  Technique:  Whole body anterior and posterior images were obtained approximately 3 hours after intravenous injection of radiopharmaceutical.  Radiopharmaceutical: 25  MILLI CURIE TC-MDP TECHNETIUM TC 14M MEDRONATE IV KIT  Comparison: 03/12/2012 study.  Findings: Numerous skeletal lesions have developed since the previous study of 03/12/2012.  There is involvement of both the axial and appendicular skeleton.  The appearance is consistent with multiple metastases associated with hematogenous spread of disease. The most extensive activity is in the bony pelvis and thoracolumbar spine.  Extensive lesions are also seen within the ribs.  Lesions are seen in the facial bones and skull as well as the scapulae, left clavicle, humerus bilaterally and femurs bilaterally.  The disease is very extensive.  Renal activity is very difficult to visualize although there is some activity within  the urinary bladder.  IMPRESSION: Extensive skeletal disease has developed since last study of 03/12/2012 in multiple areas of the axial and appendicular skeleton. The appearance is consistent with multiple metastases associated with hematogenous spread of disease.  See above for details.   Original Report Authenticated By: Onalee Hua Call     Assessment/Plan: Peter [name] is a 79 y.o. male with PMX significant for prostate Ca and skin Ca, DM, HTN, HLD, Anemia, and Sleep Apnea is admitted with weakness and abdominal pain. CT finding positive for multiple bone metastasis, and  necrotic/cystic lesion on Prostate also concerning for metastatic disease.   1. Weakness: Decreased oral intake and difficulty taking care for himself for one week duration. No problems with appetite but rather with ability and desire to self prepare his meals. (pt does not cook, he eats at restaurants and due to weakness did not feel like going out). Appetite improved today.  - PT/OT rec [facility] PT/OT with 24 hr supervision-case management c/s placed  - weakness likely related to metastatic changes - CT head negative for chronic subdural  2. Prostate Ca: now with elevated Alk phos and lesions on CT that are consistent with bone mets and possible necrotic met on prostate. PSA >5000. - new findings discussed with pt on admission  - pain management with morphine PRN-has not required any prns - Palliative care consult-full care - bone scan with widely metastatic disease - onc c/s-appreciate their help in this case-will await further recs  -will plan on degarelix treatment today and f/u in 4 weeks with Dr. Arline Asp  3. HTN: BP in normal range - will hold lisinopril  - monitor BP   4. Hx arrythmia s/p pacemaker: no events on tele  -continue to monitor   5. Anemia: stable at 10.7 for a year. Iron panel last year was wnl. Most likely secondary to chronic disease  -continues to be stable -will continue to monitor  6. HLD: on simvastatin, AST to 74 - hold simvastatin with elevation in AST  7. DM: CBGs 131-151 - will monitor CBG's  - SSI sensitive   8. Sleep apnea: Not on cpap  - will monitor O2 saturation  - O2 if needed   FEN/GI: CHO diet. KVO Prophylaxis: SCD Disposition: d/c today following getting degarelix shot  Marikay Alar, MD 03/25/2013, 7:21 AM

## 2013-03-25 NOTE — Progress Notes (Signed)
OT Cancellation Note  Treatment cancelled today due to patient's refusal to participate, reports he is too tired to participate in session and would like to rest.  Will re-attempt later today as time allows.  03/25/2013 Cipriano Mile OTR/L Pager [phone] Office [phone]

## 2013-03-25 NOTE — Progress Notes (Signed)
FMTS Attending Daily Note:  Jeff Faatima Tench MD  [phone] pager  Family Practice pager:  [phone] I have discussed this patient with the resident Dr. Sonnenberg.  I agree with their findings, assessment, and care plan  

## 2013-03-25 NOTE — Discharge Summary (Signed)
Physician Discharge Summary  Patient ID: Peter [name] MRN: [id number] DOB: [birth year] Age: 79 y.o.  Admit date: 03/21/2013 Discharge date: 03/25/2013 Admitting Physician: Peter Grim, MD  PCP: Peter Chad, MD  Consultants: Onc, palliative     Discharge Diagnosis: Principal Problem:   PROSTATE CANCER Active Problems:   HYPERLIPIDEMIA   OBESITY, NOS   APNEA, SLEEP   PACEMAKER, PERMANENT   Weakness generalized   Encounter for palliative care   Metastasis from malignant tumor of prostate    Hospital Course Peter [name] is a 79 y.o. male with PMX significant for prostate Ca and skin Ca, DM, HTN, HLD, Anemia, and Sleep Apnea is admitted with weakness and abdominal pain. CT finding positive for multiple bone metastasis, and necrotic/cystic lesion on Prostate also concerning for metastatic disease.   1. Prostate Ca: patient with history of prostate cancer dating back to early 1990's. Has been followed by urologist, Dr. Annabell [name] for this issue. Now with elevated Alk phos to 1659 and lesions on CT that are consistent with bone mets and possible necrotic met on prostate. PSA >5000. Testosterone 186. LDH 479 on admission and 371 on repeat. Oncology was consulted. Bone scan was obtained that revealed diffuse mets to the skeleton. It was determined that the patient had not previously received treatment for this issue. He was started on degarelix the day of discharge and the oncology office was supposed to call the patient to set up outpatient follow-up. Additionally palliative care was consulted and the patient elected for full care at this time.  2. Weakness: Decreased oral intake and difficulty taking care for himself for one week duration. No problems with appetite but rather with ability and desire to self prepare his meals. Complained of intermittent abdominal pain on admission and CT abd/pelvis was obtained that revealed possible necrotic or cystic mass in the right  peripheral zone of the prostate with multiple metastatic lesions to the bone. It was felt patients weakness likely related to bone mets and progression of prostate cancer. PT/OT consulted and rec'd [facility] PT/OT with 24 hour supervision. Additionally patient endorsed intermittent falls and CT head was obtained that was negative for any acute findings or chronic subdural hematoma.  3. HTN: BP in normal range systolic throughout hospitalization with diastolic in the 50's-60's. Lisinopril was held during hospitalization.    4. Hx arrythmia s/p pacemaker: no events on tele.   5. Anemia: stable at 10.7 for a year. Iron panel last year was wnl. Most likely secondary to chronic disease. Stable throughout hospitalization and 9.6 at discharge.   6. HLD: on simvastatin, AST to 74. Simvastatin held during hospitalization.  7. DM: CBGs well controlled on SSI. Restarted metformin on discharge.   8. Sleep apnea: Not on cpap. Patient maintained O2 sats well throughout hospitalization.  - will monitor O2 saturation  - O2 if needed   Problem List 1. Weakness 2. Prostate cancer with mets to bone 3. HTN 4. Hx of arrhythmia s/p pacemaker 5. Anemia 6. HLD 7. DM 8. Sleep apnea      Procedures/Imaging:  Dg Chest 2 View  03/21/2013   *RADIOLOGY REPORT*  Clinical Data: Weakness.  Diabetes.  CHEST - 2 VIEW  Comparison: 03/21/2013  Findings: Dual lead pacer remains in place.  Mild cardiomegaly noted. Chronically elevated right hemidiaphragm noted with poor definition of the left hemidiaphragm as before.  On the lateral projection, there is subtle posterior blunting of both hemidiaphragms suggesting trace bilateral pleural effusions. Subsegmental atelectasis or scarring noted  along the right hemidiaphragm.  No pulmonary edema.  IMPRESSION:  1.  Mild blunting of both posterior costophrenic angles compatible with trace bilateral pleural effusions. 2.  Cardiomegaly, without edema. 3.  Elevated right hemidiaphragm.    Original Report Authenticated By: Gaylyn Rong, M.D.   Ct Head Wo Contrast  03/24/2013   *RADIOLOGY REPORT*  Clinical Data: Chronic falls  CT HEAD WITHOUT CONTRAST  Technique:  Contiguous axial images were obtained from the base of the skull through the vertex without contrast.  Comparison: CT 04/25/2005  Findings: Moderate atrophy.  Chronic microvascular ischemic change in the white matter.  No acute infarct, hemorrhage, or mass.  No edema or midline shift.  The calvarium is intact.  Mild mucosal edema in the maxillary sinuses.  IMPRESSION: Atrophy and chronic microvascular ischemia.  No acute intracranial abnormality.   Original Report Authenticated By: Janeece Riggers, M.D.   Nm Bone Scan Whole Body  03/24/2013   *RADIOLOGY REPORT*  Clinical Data: History of bone lesions demonstrated on previous CT. History of prostatic carcinoma.  Pain and cervical area of spine.  NUCLEAR MEDICINE WHOLE BODY BONE SCINTIGRAPHY  Technique:  Whole body anterior and posterior images were obtained approximately 3 hours after intravenous injection of radiopharmaceutical.  Radiopharmaceutical: 25  MILLI CURIE TC-MDP TECHNETIUM TC 34M MEDRONATE IV KIT  Comparison: 03/12/2012 study.  Findings: Numerous skeletal lesions have developed since the previous study of 03/12/2012.  There is involvement of both the axial and appendicular skeleton.  The appearance is consistent with multiple metastases associated with hematogenous spread of disease. The most extensive activity is in the bony pelvis and thoracolumbar spine.  Extensive lesions are also seen within the ribs.  Lesions are seen in the facial bones and skull as well as the scapulae, left clavicle, humerus bilaterally and femurs bilaterally.  The disease is very extensive.  Renal activity is very difficult to visualize although there is some activity within the urinary bladder.  IMPRESSION: Extensive skeletal disease has developed since last study of 03/12/2012 in multiple areas  of the axial and appendicular skeleton. The appearance is consistent with multiple metastases associated with hematogenous spread of disease.  See above for details.   Original Report Authenticated By: Onalee Hua Call   Ct Abdomen Pelvis W Contrast  03/22/2013   *RADIOLOGY REPORT*  Clinical Data: Weakness.  Reduced appetite.  CT ABDOMEN AND PELVIS WITH CONTRAST  Technique:  Multidetector CT imaging of the abdomen and pelvis was performed following the standard protocol during bolus administration of intravenous contrast.  Contrast: 50mL OMNIPAQUE IOHEXOL 300 MG/ML  SOLN, OMNIPAQUE IOHEXOL 300 MG/ML  SOLN  Comparison: Radiographs from 03/21/2013  Findings: Cardiomegaly observed.  Previously seen blunting of the costophrenic angles appears to be due to mild prominence of pleural adipose tissue and adjacent subsegmental atelectasis rather than pleural effusions.  Nodular hepatic contour compatible with cirrhosis.  No discrete liver mass identified.  Upper normal splenic size.  Anterior right renal cysts, 2.5 x 2.2 cm, potentially minimally complex. Posterior right renal cyst, 1.5 x 1.3 cm, thought to be simple. Left kidney unremarkable.  Aortoiliac atherosclerotic vascular disease noted.  Appendix not well seen.  Scattered sigmoid diverticula noted without active diverticulitis.  Abnormally symmetric low density along the right peripheral zone of the prostate gland observed on images 74-80 of series 2, concerning for prostate malignancy.  Pathologic right external iliac node, short axis 1.3 cm on image 66 of series to.  Patchy sclerosis noted in the pelvis and lumbar spine, suspicious for  osseous metastatic disease.  Similar findings in the right proximal femur.  Bilateral pars defects at L5 noted with 6 mm anterolisthesis and associated bilateral foraminal stenosis, right greater than left.  IMPRESSION:  1.  Possible necrotic or cystic mass in the right peripheral zone of the prostate gland.  Scattered ill-defined  sclerotic lesions throughout the skeleton, suspicious for prostate metastatic disease. 2.  Pathologically enlarged right external iliac lymph node, suspicious for malignancy. 3.  Cardiomegaly.  4.  Hepatic cirrhosis without liver mass seen. 5. Benign appearing right renal cysts. 6.  Pars defects at L5 with 6 mm anterolisthesis and right foraminal stenosis.   Original Report Authenticated By: Gaylyn Rong, M.D.   Dg Chest Port 1 View  03/21/2013   *RADIOLOGY REPORT*  Clinical Data: Weakness.  Left-sided chest pain.  PORTABLE CHEST - 1 VIEW  Comparison: 08/28/2010  Findings: Cardiomegaly noted with dual lead pacer in place, lead tip position is unchanged.  Mildly elevated right hemidiaphragm, chronic.  Chronic poor definition of the left hemidiaphragm.  Thoracic spondylosis noted.  IMPRESSION:  1.  Cardiomegaly, without edema. 2.  Chronically elevated right hemidiaphragm. 3.  Chronic poor definition of the left hemidiaphragm likely attributable to diaphragmatic configuration, but reduces negative predictive value for assessment of the left lower lobe on frontal radiography.   Original Report Authenticated By: Gaylyn Rong, M.D.   Dg Abd 2 Views  03/21/2013   *RADIOLOGY REPORT*  Clinical Data: Abdominal pain and distention  ABDOMEN - 2 VIEW  Comparison: None  Findings: No dilated loops of small bowel identified.  On the decubitus radiograph there are multiple colonic fluid levels.  No abnormal abdominal or pelvic calcifications.  IMPRESSION:  1.  No evidence for small bowel obstruction. 2.  Colonic fluid levels noted.   Original Report Authenticated By: Signa Kell, M.D.    Labs  CBC  Recent Labs Lab 03/22/13 0930 03/23/13 0830 03/24/13 0546  WBC 4.1 5.2 4.3  HGB 9.2* 10.4* 9.6*  HCT 27.0* 29.6* 27.8*  PLT 147* 169 148*   BMET  Recent Labs Lab 03/21/13 2025 03/22/13 0930 03/23/13 0830 03/24/13 0546  NA 133* 132* 133* 136  K 4.4 4.2 4.2 4.3  CL 97 97 97 101  CO2 25 21 23 29    BUN 15 13 10 10   CREATININE 0.79 0.73 0.79 0.76  CALCIUM 9.0 8.3* 8.3* 8.4  PROT 7.6  --   --   --   BILITOT 0.6  --   --   --   ALKPHOS 1659*  --   --   --   ALT 19  --   --   --   AST 74*  --   --   --   GLUCOSE 195* 181* 183* 150*   Results for orders placed during the hospital encounter of 03/21/13 (from the past 72 hour(s))  GLUCOSE, CAPILLARY     Status: Abnormal   Collection Time    03/22/13 11:35 AM      Result Value Range   Glucose-Capillary 134 (*) 70 - 99 mg/dL  GLUCOSE, CAPILLARY     Status: Abnormal   Collection Time    03/22/13  4:38 PM      Result Value Range   Glucose-Capillary 170 (*) 70 - 99 mg/dL  GLUCOSE, CAPILLARY     Status: Abnormal   Collection Time    03/22/13  9:09 PM      Result Value Range   Glucose-Capillary 133 (*) 70 - 99 mg/dL  GLUCOSE, CAPILLARY     Status: Abnormal   Collection Time    03/23/13  7:31 AM      Result Value Range   Glucose-Capillary 142 (*) 70 - 99 mg/dL  CBC     Status: Abnormal   Collection Time    03/23/13  8:30 AM      Result Value Range   WBC 5.2  4.0 - 10.5 K/uL   RBC 3.34 (*) 4.22 - 5.81 MIL/uL   Hemoglobin 10.4 (*) 13.0 - 17.0 g/dL   HCT 21.3 (*) 08.6 - 57.8 %   MCV 88.6  78.0 - 100.0 fL   MCH 31.1  26.0 - 34.0 pg   MCHC 35.1  30.0 - 36.0 g/dL   RDW 46.9  62.9 - 52.8 %   Platelets 169  150 - 400 K/uL  BASIC METABOLIC PANEL     Status: Abnormal   Collection Time    03/23/13  8:30 AM      Result Value Range   Sodium 133 (*) 135 - 145 mEq/L   Potassium 4.2  3.5 - 5.1 mEq/L   Chloride 97  96 - 112 mEq/L   CO2 23  19 - 32 mEq/L   Glucose, Bld 183 (*) 70 - 99 mg/dL   BUN 10  6 - 23 mg/dL   Creatinine, Ser 4.13  0.50 - 1.35 mg/dL   Calcium 8.3 (*) 8.4 - 10.5 mg/dL   GFR calc non Af Amer 76 (*) >90 mL/min   GFR calc Af Amer 88 (*) >90 mL/min   Comment:            The eGFR has been calculated     using the CKD EPI equation.     This calculation has not been     validated in all clinical     situations.      eGFR's persistently     <90 mL/min signify     possible Chronic Kidney Disease.  GLUCOSE, CAPILLARY     Status: Abnormal   Collection Time    03/23/13 11:58 AM      Result Value Range   Glucose-Capillary 144 (*) 70 - 99 mg/dL  LACTATE DEHYDROGENASE     Status: Abnormal   Collection Time    03/23/13  2:37 PM      Result Value Range   LDH 371 (*) 94 - 250 U/L  PSA     Status: Abnormal   Collection Time    03/23/13  2:37 PM      Result Value Range   PSA >5000.00 (*) <=4.00 ng/mL   Comment: (NOTE)     Result repeated and verified.     Result confirmed by automatic dilution.     Test Methodology: ECLIA PSA (Electrochemiluminescence Immunoassay)     For PSA values from 2.5-4.0, particularly in younger men <60 years     old, the AUA and NCCN suggest testing for % Free PSA (3515) and     evaluation of the rate of increase in PSA (PSA velocity).  GLUCOSE, CAPILLARY     Status: Abnormal   Collection Time    03/23/13  4:36 PM      Result Value Range   Glucose-Capillary 151 (*) 70 - 99 mg/dL  GLUCOSE, CAPILLARY     Status: Abnormal   Collection Time    03/23/13 10:33 PM      Result Value Range   Glucose-Capillary 126 (*) 70 - 99 mg/dL  TESTOSTERONE  Status: Abnormal   Collection Time    03/24/13  5:46 AM      Result Value Range   Testosterone 186 (*) 300 - 890 ng/dL   Comment: (NOTE)              Tanner Stage       Male              Male                  I              < 30 ng/dL        < 10 ng/dL                  II             < 150 ng/dL       < 30 ng/dL                  III            100-320 ng/dL     < 35 ng/dL                  IV             200-970 ng/dL     16-10 ng/dL                  V/Adult        300-890 ng/dL     96-04 ng/dL  CBC     Status: Abnormal   Collection Time    03/24/13  5:46 AM      Result Value Range   WBC 4.3  4.0 - 10.5 K/uL   RBC 3.11 (*) 4.22 - 5.81 MIL/uL   Hemoglobin 9.6 (*) 13.0 - 17.0 g/dL   HCT 54.0 (*) 98.1 - 19.1 %   MCV 89.4  78.0  - 100.0 fL   MCH 30.9  26.0 - 34.0 pg   MCHC 34.5  30.0 - 36.0 g/dL   RDW 47.8  29.5 - 62.1 %   Platelets 148 (*) 150 - 400 K/uL  BASIC METABOLIC PANEL     Status: Abnormal   Collection Time    03/24/13  5:46 AM      Result Value Range   Sodium 136  135 - 145 mEq/L   Potassium 4.3  3.5 - 5.1 mEq/L   Chloride 101  96 - 112 mEq/L   CO2 29  19 - 32 mEq/L   Glucose, Bld 150 (*) 70 - 99 mg/dL   BUN 10  6 - 23 mg/dL   Creatinine, Ser 3.08  0.50 - 1.35 mg/dL   Calcium 8.4  8.4 - 65.7 mg/dL   GFR calc non Af Amer 77 (*) >90 mL/min   GFR calc Af Amer 90 (*) >90 mL/min   Comment:            The eGFR has been calculated     using the CKD EPI equation.     This calculation has not been     validated in all clinical     situations.     eGFR's persistently     <90 mL/min signify     possible Chronic Kidney Disease.  GLUCOSE, CAPILLARY     Status: Abnormal   Collection Time    03/24/13  7:13 AM      Result Value Range   Glucose-Capillary  131 (*) 70 - 99 mg/dL   Comment 1 Notify RN    GLUCOSE, CAPILLARY     Status: Abnormal   Collection Time    03/24/13  4:42 PM      Result Value Range   Glucose-Capillary 125 (*) 70 - 99 mg/dL   Comment 1 Notify RN    GLUCOSE, CAPILLARY     Status: Abnormal   Collection Time    03/24/13  9:30 PM      Result Value Range   Glucose-Capillary 250 (*) 70 - 99 mg/dL   Comment 1 Notify RN    GLUCOSE, CAPILLARY     Status: Abnormal   Collection Time    03/25/13  7:24 AM      Result Value Range   Glucose-Capillary 141 (*) 70 - 99 mg/dL       Patient condition at time of discharge/disposition: stable  Disposition-home   Follow up issues: 1. F/u with oncology-they should have called the patient to schedule outpatient f/u 2. Stopped patients lisinopril in hospital and have asked granddaughter to relay that this needs to be held due to normal to low normal BP's, will need to consider restarting this if patients BP is not within normal range 3. Held  simvastatin in setting of elevated AST, will need to determine utility of this in patient aged 44 and decide if he needs this added back. Additionally recheck LFTs at follow-up.  Discharge follow up:  Follow-up Information   Follow up with Peter Chad, MD On 04/07/2013. (1:45 pm)    Contact information:   [address] [place] [address] [phone]       Follow up with Samul Dada, MD. (They should be contacting you with appointment details, if you have not heard from them in the next week please call to see about your follow-up appointment)    Contact information:   [address] [place] [address] [phone]       Future Appointments Provider Department Dept Phone   04/07/2013 1:45 PM Zachery Dauer, MD [facility] [phone]       Discharge Instructions: Please refer to Patient Instructions section of EMR for full details.  Patient was counseled important signs and symptoms that should prompt return to medical care, changes in medications, dietary instructions, activity restrictions, and follow up appointments.    Discharge Medications   Medication List    TAKE these medications       lisinopril 2.5 MG tablet  Commonly known as:  PRINIVIL,ZESTRIL  Take 2.5 mg by mouth every morning.     metFORMIN 500 MG 24 hr tablet  Commonly known as:  GLUCOPHAGE-XR  Take 1 tablet (500 mg total) by mouth daily with breakfast.     simvastatin 20 MG tablet  Commonly known as:  ZOCOR  Take 20 mg by mouth every evening.       Called patients daughter to let her know the patient needs to stop taking his lisinopril given his normal range blood pressure off of this and to stop taking his simvastatin due to his bump in AST. There was no answer and I left a message. Gave number to our clinic if she had any questions regarding these changes. Will let PCP make determination if these can be restarted in the outpatient setting.  Marikay Alar, MD of [facility] 03/27/2013 2:07 PM

## 2013-03-25 NOTE — Progress Notes (Signed)
PT Cancellation Note  Patient Details Name: Peter [name] MRN: [id number] DOB: [birth year]   Cancelled Treatment:     Pt refusing to participate in therapy at this time states he's trying to get some sleep.  Pt became irritated when encouraged to participate.       Verdell Face, [place] [phone] 03/25/2013

## 2013-03-27 DIAGNOSIS — C61 Malignant neoplasm of prostate: Secondary | ICD-10-CM | POA: Diagnosis present

## 2013-03-31 NOTE — Discharge Summary (Signed)
Family Medicine Teaching Service  Discharge Note : Attending Jeff Walden MD Pager [phone] Inpatient Team Pager:  [phone]  I have reviewed this patient and the patient's chart and have discussed discharge planning with the resident at the time of discharge. I agree with the discharge plan as above.    

## 2013-04-03 ENCOUNTER — Telehealth: Payer: Self-pay | Admitting: *Deleted

## 2013-04-03 NOTE — Telephone Encounter (Signed)
Discussed with Baxter Hire. I will review the risks of driving with him and may recommend OT evaluation. Alternatively, I may recommend that the [facility] re-evaluate him.

## 2013-04-03 NOTE — Telephone Encounter (Signed)
TC to pt to remind of hosp f/u appt 04/07/13.  [name], pt's granddaughter verbalized to me many concerns regarding pt, who is now living with her.  She requested to speak to Dr. Sheffield Slider prior to appt.  Will address with Dr. Sheffield Slider.  Jahyra Sukup, Darlyne Russian, CMA

## 2013-04-07 ENCOUNTER — Encounter: Payer: Self-pay | Admitting: Family Medicine

## 2013-04-07 ENCOUNTER — Ambulatory Visit (INDEPENDENT_AMBULATORY_CARE_PROVIDER_SITE_OTHER): Payer: Medicare Other | Admitting: Family Medicine

## 2013-04-07 VITALS — BP 113/64 | HR 81 | Ht 65.0 in | Wt 195.5 lb

## 2013-04-07 DIAGNOSIS — N400 Enlarged prostate without lower urinary tract symptoms: Secondary | ICD-10-CM

## 2013-04-07 DIAGNOSIS — E119 Type 2 diabetes mellitus without complications: Secondary | ICD-10-CM

## 2013-04-07 DIAGNOSIS — R413 Other amnesia: Secondary | ICD-10-CM

## 2013-04-07 DIAGNOSIS — C61 Malignant neoplasm of prostate: Secondary | ICD-10-CM

## 2013-04-07 DIAGNOSIS — C800 Disseminated malignant neoplasm, unspecified: Secondary | ICD-10-CM

## 2013-04-07 DIAGNOSIS — R5383 Other fatigue: Secondary | ICD-10-CM

## 2013-04-07 DIAGNOSIS — R531 Weakness: Secondary | ICD-10-CM

## 2013-04-07 LAB — POCT GLYCOSYLATED HEMOGLOBIN (HGB A1C): Hemoglobin A1C: 6.6

## 2013-04-07 NOTE — Assessment & Plan Note (Addendum)
well controlled Metformin restarted at discharge. He remains off Lisinopril due to some low blood pressures in the hospital.

## 2013-04-07 NOTE — Assessment & Plan Note (Signed)
Currently minimal symptoms

## 2013-04-07 NOTE — Progress Notes (Signed)
  Subjective:    Patient ID: Peter [name], male    DOB: 1919/07/15, 79 y.o.   MRN: [id number]  HPI Weakness - he was treated for dehydration and mild malnutrition during the recent hospitalization. His grand daughter and her husband are fixing meals and he now has a good appetite. His strength is gradually improving with therapist's exercises. He fell once tripping over a cushion left in the hall and needed to be helped up.   Confusion - mild, noted by the therapists and grand daughter at times.   Prostate CA - widely metastatic on bone scan during the hospitalization. He'd had watchful waiting by his urologist, but it apparently became much more active in recent months as suggested by his rise in Alk Phos from 71 in April 2014 to 1659 in May. He denies pain in his bones or even mild joint pains. He has had 2 cancer treatment injections and denies side effects like insomnia or depression.  Renal protection - he remains off Lisinopril  Disposition - he wants to return to living alone and to driving himself if possible. He says he passed his DMV exam last year.  Review of Systems  Respiratory: Negative for shortness of breath.   Cardiovascular: Negative for chest pain and leg swelling.       Objective:   Physical Exam  Constitutional:  Central obesity Much better kempt than in recent visits  Cardiovascular: Normal rate and regular rhythm.   No murmur heard. Pulmonary/Chest: Effort normal and breath sounds normal. He has no wheezes. He has no rales.  Abdominal: Soft. He exhibits no distension and no mass. There is no tenderness. There is no guarding.  Musculoskeletal: He exhibits no edema.  Neurological: He is alert.  Psychiatric: He has a normal mood and affect. His behavior is normal. Judgment and thought content normal.          Assessment & Plan:

## 2013-04-07 NOTE — Assessment & Plan Note (Addendum)
Resolving mild delirium in a patient whose intelligence and education could conceal mild dementia Although he is mentally competent, he maintains some denial of his increased frailty. I emphasized how lucky he is to be able to stay with his grand daughter and that he shouldn't drive until he has a specific OT driver's evaluation.

## 2013-04-07 NOTE — Patient Instructions (Addendum)
Please return to see Dr Sheffield Slider in 1 month  Call when you want to schedule the driving occupational therapy.   Schedule your eye exam.

## 2013-04-07 NOTE — Assessment & Plan Note (Addendum)
Improving Check vitamin D level next blood draw

## 2013-04-15 ENCOUNTER — Telehealth: Payer: Self-pay | Admitting: *Deleted

## 2013-04-15 NOTE — Telephone Encounter (Signed)
Kim with [facility] calling.  States she went out to see Mr. Rommel yesterday and today to find no one at home.  She states she has also left messages for his granddaughter but has not gotten any return calls.  States she even called the hospital to make sure he hadn't been admitted.  Wanting to know if Dr. Sheffield Slider had any insight?  Per Dr. Sheffield Slider, Mr. Senger is currently staying with his granddaughter but is not aware of anything going on with him as to why he is not at home.  Verified with Selena Batten that she is going to granddaughter's house and not Mr. Ikard's house.  Per Selena Batten,  the granddaughter's house is the only place they have been going to.  She just ask if we hearing anything, to give her a call. Ileana Ladd

## 2013-04-20 ENCOUNTER — Other Ambulatory Visit: Payer: Self-pay | Admitting: Family Medicine

## 2013-04-20 ENCOUNTER — Other Ambulatory Visit: Payer: Self-pay | Admitting: Oncology

## 2013-04-20 ENCOUNTER — Other Ambulatory Visit: Payer: Self-pay | Admitting: Medical Oncology

## 2013-04-20 ENCOUNTER — Encounter: Payer: Self-pay | Admitting: Medical Oncology

## 2013-04-20 DIAGNOSIS — C61 Malignant neoplasm of prostate: Secondary | ICD-10-CM

## 2013-04-20 NOTE — Telephone Encounter (Signed)
The patients granddaughter called to let Dr. Sheffield Slider know that her grandfather is completely out of his Metformin and needs this refilled as soon as possible.

## 2013-04-21 ENCOUNTER — Telehealth: Payer: Self-pay | Admitting: Oncology

## 2013-04-21 NOTE — Telephone Encounter (Signed)
S/W PT [facility] F/U ON 05/04/13@10 :30   LAB-04/22/13@10 :45

## 2013-04-21 NOTE — Telephone Encounter (Signed)
C/D 04/21/13 for appt. 05/04/13

## 2013-04-22 ENCOUNTER — Other Ambulatory Visit: Payer: Medicare Other | Admitting: Lab

## 2013-04-22 ENCOUNTER — Telehealth: Payer: Self-pay | Admitting: Oncology

## 2013-04-22 ENCOUNTER — Other Ambulatory Visit: Payer: Self-pay | Admitting: Medical Oncology

## 2013-04-22 NOTE — Telephone Encounter (Signed)
s.w. pt grandaughter and advised on 6.19 and 6.30.14 appts...Marland Kitchenok and aware

## 2013-04-23 ENCOUNTER — Ambulatory Visit (HOSPITAL_BASED_OUTPATIENT_CLINIC_OR_DEPARTMENT_OTHER): Payer: Medicare Other

## 2013-04-23 ENCOUNTER — Other Ambulatory Visit (HOSPITAL_BASED_OUTPATIENT_CLINIC_OR_DEPARTMENT_OTHER): Payer: Medicare Other | Admitting: Lab

## 2013-04-23 VITALS — BP 110/78 | HR 92 | Temp 98.6°F

## 2013-04-23 DIAGNOSIS — C7951 Secondary malignant neoplasm of bone: Secondary | ICD-10-CM

## 2013-04-23 DIAGNOSIS — Z5111 Encounter for antineoplastic chemotherapy: Secondary | ICD-10-CM

## 2013-04-23 DIAGNOSIS — C61 Malignant neoplasm of prostate: Secondary | ICD-10-CM

## 2013-04-23 DIAGNOSIS — C7952 Secondary malignant neoplasm of bone marrow: Secondary | ICD-10-CM

## 2013-04-23 LAB — COMPREHENSIVE METABOLIC PANEL (CC13)
ALT: 19 U/L (ref 0–55)
BUN: 13.7 mg/dL (ref 7.0–26.0)
CO2: 24 mEq/L (ref 22–29)
Calcium: 8.2 mg/dL — ABNORMAL LOW (ref 8.4–10.4)
Chloride: 103 mEq/L (ref 98–107)
Creatinine: 0.8 mg/dL (ref 0.7–1.3)

## 2013-04-23 LAB — PSA: PSA: 459.2 ng/mL — ABNORMAL HIGH (ref <=4.00)

## 2013-04-23 LAB — CBC WITH DIFFERENTIAL/PLATELET
Eosinophils Absolute: 0.1 10*3/uL (ref 0.0–0.5)
MCV: 91 fL (ref 79.3–98.0)
MONO%: 6.8 % (ref 0.0–14.0)
NEUT#: 2 10*3/uL (ref 1.5–6.5)
RBC: 2.89 10*6/uL — ABNORMAL LOW (ref 4.20–5.82)
RDW: 15.2 % — ABNORMAL HIGH (ref 11.0–14.6)
WBC: 3 10*3/uL — ABNORMAL LOW (ref 4.0–10.3)
lymph#: 0.7 10*3/uL — ABNORMAL LOW (ref 0.9–3.3)

## 2013-04-23 LAB — LACTATE DEHYDROGENASE (CC13): LDH: 239 U/L (ref 125–245)

## 2013-04-23 MED ORDER — DEGARELIX ACETATE 120 MG ~~LOC~~ SOLR: 240.0000 mg | Freq: Once | SUBCUTANEOUS | Status: DC

## 2013-04-23 MED ORDER — DEGARELIX ACETATE 80 MG ~~LOC~~ SOLR
80.0000 mg | Freq: Once | SUBCUTANEOUS | Status: AC
  Administered 2013-04-23: 80 mg via SUBCUTANEOUS
  Filled 2013-04-23: qty 4

## 2013-04-23 NOTE — Patient Instructions (Addendum)
Degarelix injection What is this medicine? DEGARELIX (deg a REL ix) is used to treat men with advanced prostate cancer. This medicine may be used for other purposes; ask your health care provider or pharmacist if you have questions. What should I tell my health care provider before I take this medicine? They need to know if you have any of these conditions: -heart disease -kidney disease -liver disease -low levels of potassium or magnesium in the blood -osteoporosis -an unusual or allergic reaction to degarelix, mannitol, other medicines, foods, dyes, or preservatives -pregnant or trying to get pregnant -breast-feeding How should I use this medicine? This medicine is for injection under the skin. It is usually given by a health care professional in a hospital or clinic setting. If you get this medicine at home, you will be taught how to prepare and give this medicine. Use exactly as directed. Take your medicine at regular intervals. Do not take it more often than directed. It is important that you put your used needles and syringes in a special sharps container. Do not put them in a trash can. If you do not have a sharps container, call your pharmacist or healthcare provider to get one. Talk to your pediatrician regarding the use of this medicine in children. Special care may be needed. Overdosage: If you think you've taken too much of this medicine contact a poison control center or emergency room at once. Overdosage: If you think you have taken too much of this medicine contact a poison control center or emergency room at once. NOTE: This medicine is only for you. Do not share this medicine with others. What if I miss a dose? Try not to miss a dose. If you do miss a dose, call your doctor or health care professional for advice. What may interact with this medicine? Do not take this medicine with any of the following  medications: -amiodarone -bretylium -disopyramide -dofetilide -droperidol -ibutilide -procainamide -quinidine -sotalol This list may not describe all possible interactions. Give your health care provider a list of all the medicines, herbs, non-prescription drugs, or dietary supplements you use. Also tell them if you smoke, drink alcohol, or use illegal drugs. Some items may interact with your medicine. What should I watch for while using this medicine? Visit your doctor or health care professional for regular checks on your progress and discuss any issues before you start taking this medicine. Your doctor or health care professional will need to monitor your hormone levels in your blood to check your response to treatment. Try to keep any appointments for testing. What side effects may I notice from receiving this medicine? Side effects that you should report to your doctor or health care professional as soon as possible: -allergic reactions like skin rash, itching or hives, swelling of the face, lips, or tongue -fever or chills -irregular heartbeat -nausea and vomiting along with severe abdominal pain -pain or difficulty passing urine -pelvic pain or bloating Side effects that usually do not require medical attention (Report these to your doctor or health care professional if they continue or are bothersome.): -change in sex drive or performance -constipation -headache -high blood pressure - hot flashes (flushing of skin, increased sweating) - itching, redness or mild pain at site where injected -joint pain -trouble sleeping -unusually weak or tired -weight gain This list may not describe all possible side effects. Call your doctor for medical advice about side effects. You may report side effects to FDA at 1-800-FDA-1088. Where should I keep my  medicine? Keep out of the reach of children. This drug is usually given in a hospital or clinic and will not be stored at home. In rare  cases, this medicine may be given at home. If you are using this medicine at home, you will be instructed on how to store this medicine. Throw away any unused medicine after the expiration date on the label. NOTE: This sheet is a summary. It may not cover all possible information. If you have questions about this medicine, talk to your doctor, pharmacist, or health care provider.  2013, [employer]. (03/16/2008 4:41:07 PM)

## 2013-04-28 ENCOUNTER — Telehealth: Payer: Self-pay | Admitting: Family Medicine

## 2013-04-28 NOTE — Telephone Encounter (Signed)
I called Olegario Messier and agreed to follow her for Hospice. Please put her on the schedule for Thursday morning in [place] clinic at 11 AM. I will call the husband to arrange to see her at her home that morning.

## 2013-04-28 NOTE — Telephone Encounter (Signed)
Tiffany with [facility]  [phone]  Ext [phone] She wants to know if Dr Sheffield Slider can/ will sign orders for home health care and plan of care can be signed electronically. She will fax them over. She asks for a nurse to call her back so she can know which method dr Sheffield Slider prefers to do this

## 2013-04-28 NOTE — Telephone Encounter (Signed)
Last note on wrong chart. I called Tiffany, and she said that she has faxed the forms already

## 2013-05-04 ENCOUNTER — Other Ambulatory Visit (HOSPITAL_BASED_OUTPATIENT_CLINIC_OR_DEPARTMENT_OTHER): Payer: Medicare Other | Admitting: Lab

## 2013-05-04 ENCOUNTER — Ambulatory Visit: Payer: Medicare Other

## 2013-05-04 ENCOUNTER — Telehealth: Payer: Self-pay | Admitting: Oncology

## 2013-05-04 ENCOUNTER — Ambulatory Visit (HOSPITAL_BASED_OUTPATIENT_CLINIC_OR_DEPARTMENT_OTHER): Payer: Medicare Other | Admitting: Oncology

## 2013-05-04 ENCOUNTER — Other Ambulatory Visit: Payer: Medicare Other | Admitting: Lab

## 2013-05-04 ENCOUNTER — Encounter: Payer: Self-pay | Admitting: Oncology

## 2013-05-04 VITALS — BP 108/54 | HR 77 | Temp 97.7°F | Resp 18 | Ht 65.0 in | Wt 194.9 lb

## 2013-05-04 DIAGNOSIS — C61 Malignant neoplasm of prostate: Secondary | ICD-10-CM

## 2013-05-04 DIAGNOSIS — C800 Disseminated malignant neoplasm, unspecified: Secondary | ICD-10-CM

## 2013-05-04 LAB — PSA: PSA: 350.1 ng/mL — ABNORMAL HIGH (ref <=4.00)

## 2013-05-04 LAB — COMPREHENSIVE METABOLIC PANEL (CC13)
ALT: 18 U/L (ref 0–55)
AST: 30 U/L (ref 5–34)
Albumin: 3.1 g/dL — ABNORMAL LOW (ref 3.5–5.0)
Alkaline Phosphatase: 966 U/L — ABNORMAL HIGH (ref 40–150)
Glucose: 134 mg/dl (ref 70–140)
Potassium: 4.3 mEq/L (ref 3.5–5.1)
Sodium: 137 mEq/L (ref 136–145)
Total Protein: 7 g/dL (ref 6.4–8.3)

## 2013-05-04 LAB — LACTATE DEHYDROGENASE (CC13): LDH: 224 U/L (ref 125–245)

## 2013-05-04 LAB — CBC WITH DIFFERENTIAL/PLATELET
BASO%: 0.9 % (ref 0.0–2.0)
HCT: 24.9 % — ABNORMAL LOW (ref 38.4–49.9)
MCHC: 35.8 g/dL (ref 32.0–36.0)
MONO#: 0.2 10*3/uL (ref 0.1–0.9)
NEUT%: 60.5 % (ref 39.0–75.0)
WBC: 2.5 10*3/uL — ABNORMAL LOW (ref 4.0–10.3)
lymph#: 0.6 10*3/uL — ABNORMAL LOW (ref 0.9–3.3)

## 2013-05-04 NOTE — Progress Notes (Signed)
CC:   Excell Seltzer. Annabell Howells, M.D. Wayne A. Sheffield Slider, M.D. Armanda Magic, M.D.  PROBLEM LIST: 1. Metastatic prostate cancer with extensive involvement of the bones.     The patient had a PSA over 5,000 in mid May 2014 associated with a     bone scan carried out on 03/24/2013 that showed extensive     involvement of the bones.  A bone scan carried out on 03/12/2012     had been negative.  The patient's diagnosis of prostate cancer     apparently was made in the mid to early 1990s.  He had been under     the care of Excell Seltzer. Annabell Howells, M.D. I do not believe the patient had     received any definitive treatment for his prostate cancer     previously.  The patient did receive Degarelix Deborra Medina) 240 mg     subcutaneous on 03/23/2013.  Subsequent injections will be 80 mg     subcutaneous every 4 weeks. 2. Anemia. 3. Leukopenia. 4. Diabetes mellitus. 5. Dyslipidemia. 6. Macular degeneration. 7. History of sleep apnea.  The patient has declined CPAP. 8. History of frequent falling and precarious social situation.  The     patient lives alone.  He does not have heat or air conditioning. 9. Left-sided pacemaker. 10. Extremely poor dentition.  I would not recommend XGEVA or Zometa in     view of the patient's extremely poor dentition and high risk for     osteonecrosis of the jaw.   MEDICATIONS:  Reviewed and recorded. Current Outpatient Prescriptions  Medication Sig Dispense Refill  . metFORMIN (GLUCOPHAGE-XR) 500 MG 24 hr tablet TAKE ONE TABLET BY MOUTH EVERY DAY WITH BREAKFAST  30 tablet  5   No current facility-administered medications for this visit.    SMOKING HISTORY:  The patient is a nonsmoker.   HISTORY:  I am seeing Renton Berkley today for the first time since his hospital consult about 6 weeks ago, during which time we established the fact that the patient had widespread metastatic prostate cancer.  He had been admitted to the hospital with generalized weakness.  Subsequent workup  consisted of a strikingly positive bone scan, which had been completely negative a year ago and a PSA that was over 5000.  After informed consent was obtained from the patient and his family, we treated him with Degarelix Deborra Medina) 240 mg subcutaneous.  The patient has received a second dose, 80 mg subcutaneous on 06/19.  There has been no side effects from those injections.  The patient is here today with his daughter, Dre Gamino ([phone]) and the patient's granddaughter, Burman Foster [phone]).  The patient lives alone.  As stated above, he has a somewhat precarious living situation.  I believe the house is not well cared for, does not have heat or air conditioning.  The patient today is quite unkempt.  Interestingly, he has few complaints.  He says his energy is increasing and that his appetite is good.  He denies any pain or respiratory problems.  His only real problem is some swelling of the legs.  He says that he saw [facility] A. Sheffield Slider, M.D. a couple weeks ago.  He is not sure when his next appointment with [facility] A. Sheffield Slider, M.D. is. The patient seems to be back to his baseline status of a few months ago.   PHYSICAL EXAMINATION:  General: The patient is an elderly gentleman, quite pleasant and interactive and quite appropriate.  He is  extremely unkempt, has filthy clothes with food stains and other stains over them. He has some scratches on his legs related to some trauma.  Weight today is 194 pounds 14.4 ounces, height 5 feet 5 inches, body surface area 2.01 sq m.  Blood pressure today 108/54.  Pulse 77 and regular. Respirations regular and nonlabored.  O2 saturation on room air at rest was 100%.  He is afebrile.  There is no scleral icterus.  Mouth and pharynx: The dentition is terrible, many missing teeth and those that remain are rotted. There is no adenopathy in the neck.  Lungs:  Clear to percussion and auscultation.  There is a left-sided pacemaker.  Cardiac: Regular rhythm without  murmur or rub.  Abdomen:  Soft, nontender with no organomegaly or masses palpable.  There is a small hernia just above the umbilicus.  No axillary or inguinal adenopathy.  Extremities: 2+ edema of both lower legs, right greater than left, with scratches.  Neurologic exam seems to be normal.  The patient walks without any aids.  LABORATORY DATA:  White count 2.5, ANC 1.5, hemoglobin 8.9, hematocrit 24.9, platelets 141,000.  Chemistries are notable for an alkaline phosphatase of 966 down from 1575 on 04/23/2013.  Albumin was 3.1, up from 2.8 on 04/23/2013.  LDH 224, down from 371 on 03/23/2013.  BUN 10, creatinine 0.8.  Calcium 8.8 and glucose 134.  PSA today is pending. PSA on 04/23/2013 was 459.2, down from over 5000 on 03/23/2013.  PSA was 44.39 on 02/11/2012.  IMAGING STUDIES: 1. Chest x-ray, 2 view, on 03/21/2013 showed mild blunting of both     posterior costophrenic angles compatible with trace bilateral     pleural effusions.  There is cardiomegaly without edema and an     elevated right hemidiaphragm. 2. CT scan of abdomen and pelvis with IV contrast on 03/22/2013 showed     a possible necrotic or cystic mass in the right peripheral zone of     the prostate gland.  There was scattered ill-defined sclerotic     lesions throughout the skeleton suspicious for metastatic prostate     cancer.  There was a pathologically enlarged right external iliac     lymph node measuring 1.3 cm on image 66.  There was cardiomegaly     and hepatic cirrhosis without liver masses.  There were benign     appearing right renal cysts and a pars defect at L5 with 6 mm     anterolisthesis and right foraminal stenosis. 3. CT scan of the head without IV contrast on 03/24/2013 showed     atrophy and chronic microvascular ischemia.  No acute intracranial     abnormality. 4. Nuclear medicine whole body bone scan on 03/24/2013 showed     extensive skeletal disease has developed since the last study of      03/12/2012 involving multiple areas of the axial and appendicular     skeleton.  The appearance is consistent with multiple metastases     associated with hematogenous spread of disease.    IMPRESSION AND PLAN: Clinically, Mr. Eisenbeis seems to be certainly improved over his condition 6 weeks ago when I saw him in the hospital when he was admitted for extreme weakness.  He seems to have very aggressive prostate cancer.  His PSA and albumin have improved.  It is unclear to me, however whether we are seeing biochemical improvement which may or may not be accompanied by actual tumor control.  I am concerned about  the dropping white count and the evolving anemia which may indicate progression of bone marrow involvement.  I would certainly expect that the patient has extensive bone marrow involvement to go along with his extensive bone disease.  At the present time, the patient seems to be doing fairly well.  We are going to have him come back in 1 week, i.e., July 7th for CBC, so we can keep track of his blood counts.  The patient may need red cell transfusions and/or Aranesp to treat his anemia.  If his blood counts continue to fall, he may need a diagnostic bone marrow to confirm the clinical impression of progressive prostate cancer involving bone marrow.  The patient will be returning around July 17th for CBC, chemistries and a PSA, an M.D. appointment and a third dose of Degarelix.  In response to the patient's questions, I told him that he had widespread extensive prostate cancer involving his bones, that his disease had progressed dramatically in 1 year, that I thought it was aggressive, and that his disease is incurable and probably will be fatal for him.  The patient asked for more information about prognosis but I elected not to elaborate any further than emphasizing that he had incurable and ultimately fatal disease and that we hoped that the current treatment would be  effective in controlling his disease.  He was told that he does not need radiation at this time and that if his disease were the cause some problems,  he might need radiation.  He was also told that if the present therapy is ineffective that he may require a change in treatment.  I did not inquire at this time about living will or have a discussion about code status or other end of life issues with him.  Today's visit was fairly extensive lasting about 50 minutes and included a review of the hospital records from the patient's recent admission.    ______________________________ Samul Dada, M.D. DSM/MEDQ  D:  05/04/2013  T:  05/04/2013  Job:  [id number]

## 2013-05-04 NOTE — Telephone Encounter (Signed)
gave pt appt for lab, MD ansd chemo for July 2014, pt sent to labs today °

## 2013-05-04 NOTE — Progress Notes (Signed)
This office note has been dictated.  #[id number]

## 2013-05-05 ENCOUNTER — Encounter (INDEPENDENT_AMBULATORY_CARE_PROVIDER_SITE_OTHER): Payer: Medicare Other | Admitting: Family Medicine

## 2013-05-05 ENCOUNTER — Telehealth: Payer: Self-pay | Admitting: Family Medicine

## 2013-05-05 DIAGNOSIS — E119 Type 2 diabetes mellitus without complications: Secondary | ICD-10-CM

## 2013-05-05 DIAGNOSIS — C800 Disseminated malignant neoplasm, unspecified: Secondary | ICD-10-CM

## 2013-05-05 NOTE — Telephone Encounter (Signed)
Tiffany from [facility] called to let Dr. Sheffield Slider know she faxed some forms today 7/1 for him to fill out concerning Margaretmary Bayley. Tiffany needs Dr. Sheffield Slider to sign the forms  For home health care and fax them back to her at [phone], he can also call her with questions at [phone] ext [phone]

## 2013-05-11 ENCOUNTER — Other Ambulatory Visit (HOSPITAL_BASED_OUTPATIENT_CLINIC_OR_DEPARTMENT_OTHER): Payer: Medicare Other | Admitting: Lab

## 2013-05-11 DIAGNOSIS — C800 Disseminated malignant neoplasm, unspecified: Secondary | ICD-10-CM

## 2013-05-11 DIAGNOSIS — C61 Malignant neoplasm of prostate: Secondary | ICD-10-CM

## 2013-05-11 LAB — CBC WITH DIFFERENTIAL/PLATELET
BASO%: 0.8 % (ref 0.0–2.0)
EOS%: 3.6 % (ref 0.0–7.0)
LYMPH%: 23.5 % (ref 14.0–49.0)
MCHC: 35.3 g/dL (ref 32.0–36.0)
MONO#: 0.2 10*3/uL (ref 0.1–0.9)
MONO%: 7.9 % (ref 0.0–14.0)
Platelets: 142 10*3/uL (ref 140–400)
RBC: 2.86 10*6/uL — ABNORMAL LOW (ref 4.20–5.82)
WBC: 3.2 10*3/uL — ABNORMAL LOW (ref 4.0–10.3)

## 2013-05-14 ENCOUNTER — Other Ambulatory Visit: Payer: Self-pay

## 2013-05-18 ENCOUNTER — Other Ambulatory Visit: Payer: Medicare Other

## 2013-05-21 ENCOUNTER — Ambulatory Visit (HOSPITAL_BASED_OUTPATIENT_CLINIC_OR_DEPARTMENT_OTHER): Payer: Medicare Other

## 2013-05-21 ENCOUNTER — Other Ambulatory Visit (HOSPITAL_BASED_OUTPATIENT_CLINIC_OR_DEPARTMENT_OTHER): Payer: Medicare Other

## 2013-05-21 VITALS — BP 111/46 | HR 82 | Temp 98.3°F

## 2013-05-21 DIAGNOSIS — C61 Malignant neoplasm of prostate: Secondary | ICD-10-CM

## 2013-05-21 DIAGNOSIS — C800 Disseminated malignant neoplasm, unspecified: Secondary | ICD-10-CM

## 2013-05-21 DIAGNOSIS — C7952 Secondary malignant neoplasm of bone marrow: Secondary | ICD-10-CM

## 2013-05-21 DIAGNOSIS — Z5111 Encounter for antineoplastic chemotherapy: Secondary | ICD-10-CM

## 2013-05-21 LAB — CBC WITH DIFFERENTIAL/PLATELET
BASO%: 1.1 % (ref 0.0–2.0)
HCT: 26.5 % — ABNORMAL LOW (ref 38.4–49.9)
MCHC: 35.1 g/dL (ref 32.0–36.0)
MONO#: 0.3 10*3/uL (ref 0.1–0.9)
RBC: 2.86 10*6/uL — ABNORMAL LOW (ref 4.20–5.82)
RDW: 16.1 % — ABNORMAL HIGH (ref 11.0–14.6)
WBC: 3.1 10*3/uL — ABNORMAL LOW (ref 4.0–10.3)
lymph#: 0.7 10*3/uL — ABNORMAL LOW (ref 0.9–3.3)

## 2013-05-21 LAB — COMPREHENSIVE METABOLIC PANEL (CC13)
ALT: 23 U/L (ref 0–55)
CO2: 25 mEq/L (ref 22–29)
Calcium: 8.3 mg/dL — ABNORMAL LOW (ref 8.4–10.4)
Chloride: 104 mEq/L (ref 98–109)
Potassium: 4.3 mEq/L (ref 3.5–5.1)
Sodium: 136 mEq/L (ref 136–145)
Total Protein: 7 g/dL (ref 6.4–8.3)

## 2013-05-21 MED ORDER — DEGARELIX ACETATE 80 MG ~~LOC~~ SOLR
80.0000 mg | Freq: Once | SUBCUTANEOUS | Status: AC
  Administered 2013-05-21: 80 mg via SUBCUTANEOUS
  Filled 2013-05-21: qty 4

## 2013-05-22 LAB — PSA: PSA: 311.4 ng/mL — ABNORMAL HIGH (ref <=4.00)

## 2013-06-03 ENCOUNTER — Ambulatory Visit (HOSPITAL_BASED_OUTPATIENT_CLINIC_OR_DEPARTMENT_OTHER): Payer: Medicare Other | Admitting: Hematology and Oncology

## 2013-06-03 ENCOUNTER — Telehealth: Payer: Self-pay | Admitting: Hematology and Oncology

## 2013-06-03 VITALS — BP 117/66 | HR 89 | Temp 96.9°F | Resp 20 | Ht 65.0 in | Wt 191.9 lb

## 2013-06-03 DIAGNOSIS — C61 Malignant neoplasm of prostate: Secondary | ICD-10-CM

## 2013-06-03 DIAGNOSIS — C800 Disseminated malignant neoplasm, unspecified: Secondary | ICD-10-CM

## 2013-06-03 NOTE — Telephone Encounter (Signed)
Gave pt appt for lab, Md and injections for August 2014

## 2013-06-03 NOTE — Progress Notes (Signed)
CC:   Excell Seltzer. Annabell Howells, M.D. Wayne A. Sheffield Slider, M.D. Armanda Magic, M.D.  PROBLEM LIST: 1. Metastatic prostate cancer with extensive involvement of the bones.     The patient had a PSA over 5,000 in mid May 2014 associated with a     bone scan carried out on 03/24/2013 that showed extensive     involvement of the bones.  A bone scan carried out on 03/12/2012     had been negative.  The patient's diagnosis of prostate cancer     apparently was made in the mid to early 1990s.  He had been under     the care of Excell Seltzer. Annabell Howells, M.D. I do not believe the patient had     received any definitive treatment for his prostate cancer     previously.  The patient did receive Degarelix Deborra Medina) 240 mg     subcutaneous on 03/23/2013.  Subsequent injections will be 80 mg     subcutaneous every 4 weeks. 2. Anemia. 3. Leukopenia. 4. Diabetes mellitus. 5. Dyslipidemia. 6. Macular degeneration. 7. History of sleep apnea.  The patient has declined CPAP. 8. History of frequent falling and precarious social situation.  The     patient lives alone.  He does not have heat or air conditioning. 9. Left-sided pacemaker. 10. Extremely poor dentition.  I would not recommend XGEVA or Zometa in     view of the patient's extremely poor dentition and high risk for     osteonecrosis of the jaw.   MEDICATIONS:  Reviewed and recorded. Current Outpatient Prescriptions  Medication Sig Dispense Refill  . metFORMIN (GLUCOPHAGE-XR) 500 MG 24 hr tablet TAKE ONE TABLET BY MOUTH EVERY DAY WITH BREAKFAST  30 tablet  5   No current facility-administered medications for this visit.    SMOKING HISTORY:  The patient is a nonsmoker.   HISTORY:  I am seeing Javin Nong today for the first time. The patient was seen by dr. Arline Asp for a widespread metastatic prostate cancer.   He had been admitted to the hospital with generalized weakness.  Subsequent workup consisted of a strikingly positive bone scan, which had  been completely negative a year ago and a PSA that was over 5000.  After informed consent was obtained from the patient and his family, Dr. Arline Asp treated him with Degarelix Deborra Medina) 240 mg subcutaneous.  The patient has received a second dose, 80 mg subcutaneous on 06/19.  There has been no side effects from those injections.  The patient is here today with his daughter, Haward Pope [phone]). The patient lives alone. He says his energy is increasing and that his appetite is good.  He denies any pain or respiratory problems.    Blood pressure 117/66, pulse 89, temperature 96.9 F (36.1 C), temperature source Oral, resp. rate 20, height 5\' 5"  (1.651 m), weight 191 lb 14.4 oz (87.045 kg). PHYSICAL EXAMINATION:  General: The patient is an elderly gentleman, quite pleasant and interactive and quite appropriate.  He is extremely unkempt, has filthy clothes with food stains and other stains over them. He has some scratches on his legs related to some trauma.   There is no scleral icterus.  Mouth and pharynx: The dentition is terrible, many missing teeth and those that remain are rotted. There is no adenopathy in the neck.  Lungs:  Clear to percussion and auscultation.  There is a left-sided pacemaker.  Cardiac: Regular rhythm without murmur or rub.  Abdomen:  Soft, nontender with no  organomegaly or masses palpable.  There is a small hernia just above the umbilicus.  No axillary or inguinal adenopathy.  Extremities: 2+ edema of both lower legs, right greater than left, with scratches.  Neurologic exam seems to be normal.  The patient walks without any aids.  LABORATORY DATA:   PSA on 05/21/2013 was 311.4 and on 04/23/2013 was 459.2, down from over 5000 on 03/23/2013.  PSA was 44.39 on 02/11/2012.  IMAGING STUDIES: 1. Chest x-ray, 2 view, on 03/21/2013 showed mild blunting of both     posterior costophrenic angles compatible with trace bilateral     pleural effusions.  There is  cardiomegaly without edema and an     elevated right hemidiaphragm. 2. CT scan of abdomen and pelvis with IV contrast on 03/22/2013 showed     a possible necrotic or cystic mass in the right peripheral zone of     the prostate gland.  There was scattered ill-defined sclerotic     lesions throughout the skeleton suspicious for metastatic prostate     cancer.  There was a pathologically enlarged right external iliac     lymph node measuring 1.3 cm on image 66.  There was cardiomegaly     and hepatic cirrhosis without liver masses.  There were benign     appearing right renal cysts and a pars defect at L5 with 6 mm     anterolisthesis and right foraminal stenosis. 3. CT scan of the head without IV contrast on 03/24/2013 showed     atrophy and chronic microvascular ischemia.  No acute intracranial     abnormality. 4. Nuclear medicine whole body bone scan on 03/24/2013 showed     extensive skeletal disease has developed since the last study of     03/12/2012 involving multiple areas of the axial and appendicular     skeleton.  The appearance is consistent with multiple metastases     associated with hematogenous spread of disease.    IMPRESSION AND PLAN: Clinically, Mr. Hustead seems to be certainly improved. His PSA and albumin have improved.  Last PSA is 311 and he feels better.  At the present time, the patient seems to be doing fairly well.  We are going to have him come back in 2 week, i.e., mid August for clinic visit, PSA CBC, and injection. He was advised to take Calcium and Vitamin D, we may consider starting him on zometa.   Zachery Dakins, MD 06/03/2013 1:56 PM

## 2013-06-16 ENCOUNTER — Telehealth: Payer: Self-pay | Admitting: Hematology and Oncology

## 2013-06-17 ENCOUNTER — Other Ambulatory Visit: Payer: Medicare Other | Admitting: Lab

## 2013-06-24 ENCOUNTER — Ambulatory Visit (HOSPITAL_BASED_OUTPATIENT_CLINIC_OR_DEPARTMENT_OTHER): Payer: Medicare Other

## 2013-06-24 ENCOUNTER — Ambulatory Visit: Payer: Medicare Other

## 2013-06-24 ENCOUNTER — Other Ambulatory Visit: Payer: Medicare Other | Admitting: Lab

## 2013-06-24 ENCOUNTER — Telehealth: Payer: Self-pay | Admitting: Hematology and Oncology

## 2013-06-24 DIAGNOSIS — C61 Malignant neoplasm of prostate: Secondary | ICD-10-CM

## 2013-06-24 DIAGNOSIS — C800 Disseminated malignant neoplasm, unspecified: Secondary | ICD-10-CM

## 2013-06-24 LAB — CBC WITH DIFFERENTIAL/PLATELET
Basophils Absolute: 0 10*3/uL (ref 0.0–0.1)
Eosinophils Absolute: 0.2 10*3/uL (ref 0.0–0.5)
HGB: 9.5 g/dL — ABNORMAL LOW (ref 13.0–17.1)
MCV: 93.8 fL (ref 79.3–98.0)
MONO%: 9.2 % (ref 0.0–14.0)
NEUT#: 2.1 10*3/uL (ref 1.5–6.5)
RDW: 15 % — ABNORMAL HIGH (ref 11.0–14.6)
lymph#: 0.8 10*3/uL — ABNORMAL LOW (ref 0.9–3.3)

## 2013-06-24 LAB — COMPREHENSIVE METABOLIC PANEL (CC13)
Albumin: 3.2 g/dL — ABNORMAL LOW (ref 3.5–5.0)
BUN: 18.4 mg/dL (ref 7.0–26.0)
CO2: 24 mEq/L (ref 22–29)
Calcium: 8.5 mg/dL (ref 8.4–10.4)
Chloride: 107 mEq/L (ref 98–109)
Glucose: 111 mg/dl (ref 70–140)
Potassium: 4.7 mEq/L (ref 3.5–5.1)

## 2013-06-24 NOTE — Telephone Encounter (Signed)
Pt came by on wrong day sent him to lab to draw

## 2013-06-25 ENCOUNTER — Ambulatory Visit: Payer: Medicare Other

## 2013-06-25 ENCOUNTER — Telehealth: Payer: Self-pay | Admitting: Hematology and Oncology

## 2013-06-25 ENCOUNTER — Other Ambulatory Visit: Payer: Medicare Other | Admitting: Lab

## 2013-06-25 ENCOUNTER — Ambulatory Visit (HOSPITAL_BASED_OUTPATIENT_CLINIC_OR_DEPARTMENT_OTHER): Payer: Medicare Other

## 2013-06-25 ENCOUNTER — Ambulatory Visit (HOSPITAL_BASED_OUTPATIENT_CLINIC_OR_DEPARTMENT_OTHER): Payer: Medicare Other | Admitting: Hematology and Oncology

## 2013-06-25 VITALS — BP 100/54 | HR 76 | Temp 98.3°F | Resp 18 | Ht 65.0 in | Wt 197.6 lb

## 2013-06-25 DIAGNOSIS — C61 Malignant neoplasm of prostate: Secondary | ICD-10-CM

## 2013-06-25 DIAGNOSIS — C7951 Secondary malignant neoplasm of bone: Secondary | ICD-10-CM

## 2013-06-25 DIAGNOSIS — D649 Anemia, unspecified: Secondary | ICD-10-CM

## 2013-06-25 DIAGNOSIS — Z5111 Encounter for antineoplastic chemotherapy: Secondary | ICD-10-CM

## 2013-06-25 DIAGNOSIS — M7989 Other specified soft tissue disorders: Secondary | ICD-10-CM

## 2013-06-25 MED ORDER — DEGARELIX ACETATE 80 MG ~~LOC~~ SOLR
80.0000 mg | Freq: Once | SUBCUTANEOUS | Status: AC
  Administered 2013-06-25: 80 mg via SUBCUTANEOUS
  Filled 2013-06-25: qty 4

## 2013-06-25 NOTE — Telephone Encounter (Signed)
gv and printed appt sched and avs for pt  °

## 2013-06-30 NOTE — Progress Notes (Signed)
IDMITSURU DAULT OB: Nov 20, 1918  MR#: [id number]  WUJ#:[id number]  [facility]  [phone] [phone]   OFFICE PROGRESS NOTE  PCP: Tobin Chad, MD GYN:   SU:  OTHER MD: DIAGNOSIS:   PAST THERAPY:   CURRENT THERAPY:   HISTORY OF PRESENT ILLNESS: Peter [name] is a 79 y.o. Man who intitialy presented today for the first time since  his hospital consult about 6 weeks ago, during which time we established  the fact that the patient had widespread metastatic prostate cancer. He  had been admitted to the hospital with generalized weakness. Subsequent  workup consisted of a strikingly positive bone scan, which had been  completely negative a year ago and a PSA that was over 5000. After  informed consent was obtained from the patient and his family, we  treated him with Degarelix Deborra Medina) 240 mg subcutaneous. The patient  has received a second dose, 80 mg subcutaneous on 06/19. There has been  no side effects from those injections. The patient is here today with  his daughter, Dewayne Severe ([phone]) and the patient's granddaughter,  Burman Foster [phone]). The patient lives alone. As stated above, he  has a somewhat precarious living situation. I believe the house is not  well cared for, does not have heat or air conditioning. The patient  today is quite unkempt. Interestingly, he has few complaints. He says his energy is increasing and that his appetite is good. He denies any pain or respiratory problems. His only real problem is some swelling of the legs. He says that he saw [facility] A. Sheffield Slider, M.D. a couple weeks ago. He is not sure when his next appointment with [facility] A. Sheffield Slider, M.D. is. The patient seems to be back to his baseline status of a few months ago.      INTERVAL HISTORY:  REVIEW OF SYSTEMS:  The patient denied fever, chills, night sweats, change in appetite or weight. He denied headaches, double vision, blurry vision, nasal  congestion, nasal discharge, hearing problems, odynophagia or dysphagia. No chest pain, palpitations, dyspnea, cough, abdominal pain, nausea, vomiting, diarrhea, constipation, hematochezia. The patient denied dysuria, nocturia, polyuria, hematuria, myalgia, numbness, tingling, psychiatric problems.  PAST MEDICAL HISTORY: Past Medical History  Diagnosis Date  . Cardiac septal defect     closed self during childhood  . Blood transfusion     colon polypectomy, bleeding requiring transfusion  . Hx of colonic polyps 03/03/2005    adenomatous  . Obstructive sleep apnea   . Anemia   . Diabetes mellitus   . Diverticulosis of colon   . GERD without esophagitis   . Hyperlipidemia   . Macular degeneration   . Memory loss of   . Osteoarthritis of spine   . Periumbilical hernia   . Prostate cancer     Urologist watchfully waiting  . Psoriasis   . Chronic venous insufficiency     PAST SURGICAL HISTORY: Past Surgical History  Procedure Laterality Date  . Tonsillectomy    . Partial colectomy  07/09/02    For Polyps  . Pacemaker placement  02/06/05  . 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 Cataract extraction  6/12    left eye Dr Dione Booze  . Pacemaker replacement  06/2010    Dr Amil Amen    FAMILY HISTORY Family History  Problem Relation Age of Onset  . Dementia Mother     Died at 22  . Cancer Father   . Cancer Brother   .  Cancer Paternal Grandmother   . Kidney failure Maternal Uncle   . Alzheimer's disease Daughter   . COPD Daughter     GYNECOLOGIC HISTORY:   SOCIAL HISTORY:      ADVANCED DIRECTIVES:    HEALTH MAINTENANCE: History  Substance Use Topics  . Smoking status: Former Smoker    Types: Pipe  . Smokeless tobacco: Former Neurosurgeon  . Alcohol Use: Yes     Comment: 1 Beer Daily     Colonoscopy:  PAP:  Bone density:  Lipid panel:  No Known Allergies  Current Outpatient Prescriptions  Medication Sig Dispense Refill  . calcium-vitamin D (OSCAL WITH D)  250-125 MG-UNIT per tablet Take 1 tablet by mouth daily.      . metFORMIN (GLUCOPHAGE-XR) 500 MG 24 hr tablet TAKE ONE TABLET BY MOUTH EVERY DAY WITH BREAKFAST  30 tablet  5   No current facility-administered medications for this visit.    OBJECTIVE: Filed Vitals:   06/25/13 1430  BP: 100/54  Pulse: 76  Temp: 98.3 F (36.8 C)  Resp: 18     Body mass index is 32.88 kg/(m^2).    ECOG FS:  PHYSICAL EXAMINATION:  HEENT: Sclerae anicteric.  Conjunctivae were pink. Pupils round and reactive bilaterally. Oral mucosa is moist without ulceration or thrush. No occipital, submandibular, cervical, supraclavicular or axillar adenopathy. Lungs: clear to auscultation without wheezes. No rales or rhonchi. Heart: regular rate and rhythm. No murmur, gallop or rubs. Abdomen: soft, non tender. No guarding or rebound tenderness. Bowel sounds are present. No palpable hepatosplenomegaly. MSK: no focal spinal tenderness. Extremities: No clubbing or cyanosis.No calf tenderness to palpitation, no peripheral edema. The patient had grossly intact strength in upper and lower extremities. Skin exam was without ecchymosis, petechiae. Neuro: non-focal, alert and oriented to time, person and place, appropriate affect  LAB RESULTS:  CMP     Component Value Date/Time   NA 140 06/24/2013 1331   NA 136 03/24/2013 0546   K 4.7 06/24/2013 1331   K 4.3 03/24/2013 0546   CL 103 04/23/2013 1238   CL 101 03/24/2013 0546   CO2 24 06/24/2013 1331   CO2 29 03/24/2013 0546   GLUCOSE 111 06/24/2013 1331   GLUCOSE 186* 04/23/2013 1238   GLUCOSE 150* 03/24/2013 0546   BUN 18.4 06/24/2013 1331   BUN 10 03/24/2013 0546   CREATININE 0.9 06/24/2013 1331   CREATININE 0.76 03/24/2013 0546   CREATININE 0.99 02/12/2012 1546   CALCIUM 8.5 06/24/2013 1331   CALCIUM 8.4 03/24/2013 0546   PROT 7.0 06/24/2013 1331   PROT 7.6 03/21/2013 2025   ALBUMIN 3.2* 06/24/2013 1331   ALBUMIN 3.3* 03/21/2013 2025   AST 38* 06/24/2013 1331   AST 74* 03/21/2013  2025   ALT 26 06/24/2013 1331   ALT 19 03/21/2013 2025   ALKPHOS 271* 06/24/2013 1331   ALKPHOS 1659* 03/21/2013 2025   BILITOT 0.59 06/24/2013 1331   BILITOT 0.6 03/21/2013 2025   GFRNONAA 77* 03/24/2013 0546   GFRAA 90* 03/24/2013 0546    I No results found for this basename: SPEP, UPEP,  kappa and lambda light chains    Lab Results  Component Value Date   WBC 3.4* 06/24/2013   NEUTROABS 2.1 06/24/2013   HGB 9.5* 06/24/2013   HCT 27.3* 06/24/2013   MCV 93.8 06/24/2013   PLT 127* 06/24/2013      Chemistry      Component Value Date/Time   NA 140 06/24/2013 1331   NA 136 03/24/2013 0546  K 4.7 06/24/2013 1331   K 4.3 03/24/2013 0546   CL 103 04/23/2013 1238   CL 101 03/24/2013 0546   CO2 24 06/24/2013 1331   CO2 29 03/24/2013 0546   BUN 18.4 06/24/2013 1331   BUN 10 03/24/2013 0546   CREATININE 0.9 06/24/2013 1331   CREATININE 0.76 03/24/2013 0546   CREATININE 0.99 02/12/2012 1546      Component Value Date/Time   CALCIUM 8.5 06/24/2013 1331   CALCIUM 8.4 03/24/2013 0546   ALKPHOS 271* 06/24/2013 1331   ALKPHOS 1659* 03/21/2013 2025   AST 38* 06/24/2013 1331   AST 74* 03/21/2013 2025   ALT 26 06/24/2013 1331   ALT 19 03/21/2013 2025   BILITOT 0.59 06/24/2013 1331   BILITOT 0.6 03/21/2013 2025       No results found for this basename: LABCA2    No components found with this basename: LABCA125    No results found for this basename: INR,  in the last 168 hours  Urinalysis    Component Value Date/Time   COLORURINE YELLOW 03/21/2013 2208   APPEARANCEUR CLEAR 03/21/2013 2208   LABSPEC 1.025 03/21/2013 2208   PHURINE 5.0 03/21/2013 2208   GLUCOSEU NEGATIVE 03/21/2013 2208   HGBUR NEGATIVE 03/21/2013 2208   HGBUR negative 02/07/2009 1416   BILIRUBINUR NEGATIVE 03/21/2013 2208   KETONESUR NEGATIVE 03/21/2013 2208   PROTEINUR NEGATIVE 03/21/2013 2208   UROBILINOGEN 1.0 03/21/2013 2208   NITRITE NEGATIVE 03/21/2013 2208   LEUKOCYTESUR NEGATIVE 03/21/2013 2208    STUDIES: No results  found.  ASSESSMENT AND PLAN:    Myra Rude, MD   06/30/2013 8:18 AM

## 2013-07-10 ENCOUNTER — Telehealth: Payer: Self-pay | Admitting: Hematology and Oncology

## 2013-07-10 NOTE — Telephone Encounter (Signed)
CHANGED TIME OF 9/19 LB/FU/INJ TO 1145AM AND MOVED F/U TO NG. CALLED # IN [facility] [phone] WHICH IS AN ASSISTED LIVING FACILITY THAT PT'S DTR STAY IN AND SHE WAS NOT AVAILABLE. I WAS GIVEN PT'S # BY STAFF [phone] WHICH IS NOT WORKING. CALLED [facility] [name] W/NEW TIME FOR 9/19 APPT.

## 2013-07-20 ENCOUNTER — Telehealth: Payer: Self-pay | Admitting: Hematology and Oncology

## 2013-07-20 NOTE — Telephone Encounter (Signed)
Pt dtr called to r/s 9/19 lb/fu/inj to 9/22. dtr has appt d/t.

## 2013-07-23 ENCOUNTER — Ambulatory Visit: Payer: Medicare Other

## 2013-07-24 ENCOUNTER — Ambulatory Visit: Payer: Medicare Other

## 2013-07-24 ENCOUNTER — Other Ambulatory Visit: Payer: Self-pay | Admitting: *Deleted

## 2013-07-24 ENCOUNTER — Other Ambulatory Visit: Payer: Medicare Other | Admitting: Lab

## 2013-07-24 ENCOUNTER — Ambulatory Visit: Payer: Medicare Other | Admitting: Hematology and Oncology

## 2013-07-24 DIAGNOSIS — C61 Malignant neoplasm of prostate: Secondary | ICD-10-CM

## 2013-07-27 ENCOUNTER — Encounter: Payer: Self-pay | Admitting: Hematology and Oncology

## 2013-07-27 ENCOUNTER — Ambulatory Visit (HOSPITAL_BASED_OUTPATIENT_CLINIC_OR_DEPARTMENT_OTHER): Payer: Medicare Other

## 2013-07-27 ENCOUNTER — Ambulatory Visit (HOSPITAL_BASED_OUTPATIENT_CLINIC_OR_DEPARTMENT_OTHER): Payer: Medicare Other | Admitting: Hematology and Oncology

## 2013-07-27 ENCOUNTER — Telehealth: Payer: Self-pay | Admitting: Hematology and Oncology

## 2013-07-27 ENCOUNTER — Other Ambulatory Visit (HOSPITAL_BASED_OUTPATIENT_CLINIC_OR_DEPARTMENT_OTHER): Payer: Medicare Other | Admitting: Lab

## 2013-07-27 VITALS — BP 105/51 | HR 77 | Temp 97.7°F | Resp 18 | Ht 65.0 in | Wt 199.9 lb

## 2013-07-27 DIAGNOSIS — C61 Malignant neoplasm of prostate: Secondary | ICD-10-CM

## 2013-07-27 DIAGNOSIS — C7951 Secondary malignant neoplasm of bone: Secondary | ICD-10-CM

## 2013-07-27 DIAGNOSIS — Z23 Encounter for immunization: Secondary | ICD-10-CM

## 2013-07-27 DIAGNOSIS — C800 Disseminated malignant neoplasm, unspecified: Secondary | ICD-10-CM

## 2013-07-27 DIAGNOSIS — Z5111 Encounter for antineoplastic chemotherapy: Secondary | ICD-10-CM

## 2013-07-27 LAB — COMPREHENSIVE METABOLIC PANEL (CC13)
AST: 43 U/L — ABNORMAL HIGH (ref 5–34)
Albumin: 3.2 g/dL — ABNORMAL LOW (ref 3.5–5.0)
Alkaline Phosphatase: 359 U/L — ABNORMAL HIGH (ref 40–150)
BUN: 14.2 mg/dL (ref 7.0–26.0)
Potassium: 4.4 mEq/L (ref 3.5–5.1)
Sodium: 140 mEq/L (ref 136–145)

## 2013-07-27 LAB — CBC WITH DIFFERENTIAL/PLATELET
BASO%: 0.8 % (ref 0.0–2.0)
Basophils Absolute: 0 10*3/uL (ref 0.0–0.1)
EOS%: 6.4 % (ref 0.0–7.0)
MCH: 32.1 pg (ref 27.2–33.4)
MCHC: 34.6 g/dL (ref 32.0–36.0)
MCV: 92.8 fL (ref 79.3–98.0)
MONO%: 9.2 % (ref 0.0–14.0)
RBC: 3.02 10*6/uL — ABNORMAL LOW (ref 4.20–5.82)
RDW: 13.5 % (ref 11.0–14.6)

## 2013-07-27 MED ORDER — INFLUENZA VAC SPLIT QUAD 0.5 ML IM SUSP
0.5000 mL | INTRAMUSCULAR | Status: AC
  Administered 2013-07-27: 0.5 mL via INTRAMUSCULAR
  Filled 2013-07-27: qty 0.5

## 2013-07-27 MED ORDER — DEGARELIX ACETATE 80 MG ~~LOC~~ SOLR
80.0000 mg | Freq: Once | SUBCUTANEOUS | Status: AC
  Administered 2013-07-27: 80 mg via SUBCUTANEOUS
  Filled 2013-07-27: qty 4

## 2013-07-27 NOTE — Progress Notes (Signed)
[name] OFFICE PROGRESS NOTE  Peter [name] [name], Peter [name] [address] [place] [address] No chief complaint on file.   DIAGNOSIS: Prostate cancer to the bone, ongoing treatment with Peter [name] [name]  SUMMARY OF ONCOLOGIC HISTORY: I reviewed his records extensively and collaborate at the history with the patient. This is a pleasant 79 year old gentleman presented in the [name] with weakness and subsequently was found to have widespread metastatic cancer with a PSA were 5000. He was started on hormonal manipulation in May of 2014 and currently on a maintain his treatment every month.  INTERVAL HISTORY: Peter [name] [name] 79 y.o. male returns for routine visit prior to his cycle 5 of maintain his treatment. He is doing very well with her treatment. Denies any local reaction or rash with his monthly injection He denies any areas of bone pain. His appetite stable no recent weight loss. Denies any recent fevers or chills. The recent hematuria or difficulties with urination.  I have reviewed the past medical history, past surgical history, social history and family history with the patient and they are unchanged from previous note.  ALLERGIES:  has No Known Allergies.  MEDICATIONS: has a current medication list which includes the following prescription(s): calcium-vitamin d and metformin, and the following Facility-Administered Medications: influenza vac split quadrivalent pf.  REVIEW OF SYSTEMS:   Constitutional: Denies fevers, chills or abnormal weight loss Eyes: Denies blurriness of vision Ears, nose, mouth, throat, and face: Denies mucositis or sore throat Respiratory: Denies cough, dyspnea or wheezes Cardiovascular: Denies palpitation, chest discomfort or lower extremity swelling Gastrointestinal:  Denies nausea, heartburn or change in bowel habits Skin: Denies abnormal skin rashes Lymphatics: Denies new lymphadenopathy or easy bruising Neurological:Denies numbness,  tingling or new weaknesses Behavioral/Psych: Mood is stable, no new changes  All other systems were reviewed with the patient and are negative.  PHYSICAL EXAMINATION: ECOG PERFORMANCE STATUS: 1 - Symptomatic but completely ambulatory  Filed Vitals:   07/27/13 1303  BP: 105/51  Pulse: 77  Temp: 97.7 F (36.5 C)  Resp: 18    GENERAL:alert, no distress and comfortable he looks elderly, mildly obese SKIN: skin color, texture, turgor are normal, has diffuse senile keratosis throughout  EYES: normal, Conjunctiva are pink and non-injected, sclera clear OROPHARYNX:no exudate, no erythema and lips, buccal mucosa, and tongue normal  NECK: supple, thyroid normal size, non-tender, without nodularity LYMPH:  no palpable lymphadenopathy in the cervical, axillary or inguinal LUNGS: clear to auscultation and percussion with normal breathing effort HEART: regular rate & rhythm and no murmurs and no lower extremity edema ABDOMEN:abdomen soft, non-tender and normal bowel sounds Musculoskeletal:no cyanosis of digits and no clubbing  NEURO: alert & oriented x 3 with fluent speech, no focal motor/sensory deficits  LABORATORY DATA:  I have reviewed the data as listed    Component Value Date/Time   NA 140 07/27/2013 1247   NA 136 03/24/2013 0546   K 4.4 07/27/2013 1247   K 4.3 03/24/2013 0546   CL 103 04/23/2013 1238   CL 101 03/24/2013 0546   CO2 26 07/27/2013 1247   CO2 29 03/24/2013 0546   GLUCOSE 137 07/27/2013 1247   GLUCOSE 186* 04/23/2013 1238   GLUCOSE 150* 03/24/2013 0546   BUN 14.2 07/27/2013 1247   BUN 10 03/24/2013 0546   CREATININE 1.0 07/27/2013 1247   CREATININE 0.76 03/24/2013 0546   CREATININE 0.99 02/12/2012 1546   CALCIUM 8.9 07/27/2013 1247   CALCIUM 8.4 03/24/2013 0546   PROT 7.4 07/27/2013 1247  PROT 7.6 03/21/2013 2025   ALBUMIN 3.2* 07/27/2013 1247   ALBUMIN 3.3* 03/21/2013 2025   AST 43* 07/27/2013 1247   AST 74* 03/21/2013 2025   ALT 26 07/27/2013 1247   ALT 19 03/21/2013 2025    ALKPHOS 359* 07/27/2013 1247   ALKPHOS 1659* 03/21/2013 2025   BILITOT 0.55 07/27/2013 1247   BILITOT 0.6 03/21/2013 2025   GFRNONAA 77* 03/24/2013 0546   GFRAA 90* 03/24/2013 0546   His last PSA is about 300. ASSESSMENT: Metastatic prostate cancer to the bone, stage IV disease   PLAN:  #1 metastatic prostate cancer to the bone He is responding to the injection and his PSA has dropped from over 5000 to about 300. Last 2 PSA nadir about 300. There were no signs of symptoms to suggest progression of disease. I'm awaiting for his PSA from current visit. Per NCCN guidelines, the patient will qualify for intravenous or subcutaneous bisphosphonate to reduce skeletal events. However in order to order IVPs phosphonate, he would require dental clearance. I may pass explaining to the patient risks benefits side effects of his phosphonate. He is somewhat reluctant to see the dentist as he has cleared he would have to undergo complete dental extractions he was his dentition is poor on examination today. I recommend the patient to think about it at home and will discuss this further in our next visit. #2 metastatic disease to the bone I recommended he increase his calcium and vitamin D supplement to twice a day. As mentioned above we will talk about and consider IV bisphosphonate in the next visit. #3 preventive care A lot of his medication has been discontinued recently since the diagnosis of prostate cancer. We will continue the same. I recommend influenza vaccination in my office today.  All questions were answered. The patient knows to call the clinic with any problems, questions or concerns. We can certainly see the patient much sooner if necessary. No barriers to learning was detected.  The patient and plan discussed with Peter [name] [name], Peter [name] [name]  and he is in agreement with the aforementioned.  I spent 25 minutes counseling the patient face to face. The total time spent in the appointment was 40 minutes and more than  50% was on counseling.     Peter [name] [name], Peter [name] [name], Peter [name] 07/27/2013 1:53 PM

## 2013-07-27 NOTE — Telephone Encounter (Signed)
gve the pt his oct 2014 appt calendar along with the avs.

## 2013-07-28 LAB — PSA: PSA: 754.1 ng/mL — ABNORMAL HIGH

## 2013-07-30 ENCOUNTER — Telehealth: Payer: Self-pay | Admitting: *Deleted

## 2013-07-30 NOTE — Telephone Encounter (Signed)
Message copied by Kathlynn Grate on Thu Jul 30, 2013  4:10 PM ------      Message from: [facility], [place]      Created: Thu Jul 30, 2013 10:12 AM      Regarding: rising PSA       Please call pt's daughter (he is hard of hearing) that his PSA is rising, need further discussion, may need to change his treatment            OK to add on 10/2 for 30 minutes visit            Thanks ------

## 2013-07-30 NOTE — Telephone Encounter (Signed)
Called and spoke to daughter Peter [name] and informed her of results per Dr Bertis Ruddy.

## 2013-07-31 ENCOUNTER — Telehealth: Payer: Self-pay | Admitting: Hematology and Oncology

## 2013-07-31 ENCOUNTER — Telehealth: Payer: Self-pay | Admitting: *Deleted

## 2013-07-31 NOTE — Telephone Encounter (Signed)
Talked to pt's daughter and gave her appt for 08/06/13

## 2013-07-31 NOTE — Telephone Encounter (Signed)
Daughter pat Harbold calling re: patient's elevated PSA. She is upset that "nothing is being done" i explained to her that her name was not on the list of persons to divulge information. i tried to contact dawn aganon, who amy spoke to re: elevated PSA and gave an earlier appt of 08/06/13 to discuss a new treatment.  As opposed to the one he had for 08/26/13. i suggested pat contact dawn and get all of the information that was given  by amy mitchell, yesterday. Also suggested she have her father sign a medical release of information form, so we may discuss her fathers care with her, also.

## 2013-08-03 ENCOUNTER — Telehealth: Payer: Self-pay | Admitting: *Deleted

## 2013-08-03 ENCOUNTER — Other Ambulatory Visit: Payer: Self-pay | Admitting: Hematology and Oncology

## 2013-08-03 NOTE — Telephone Encounter (Signed)
Rec'd messages on Thursday and Friday from patient sister, Peter [name] [phone], wanting to know information of what was going on and why we scheduled an emergency visit for patient on 10/2. Peter [name] was notified on Friday that she needs to call her niece Peter [name], who is listed as HCPOA(09/11/2010) for further information. We do not have Pat or any other relatives listed to disclose information to. Left message that if information is to be shared, they need to sign new ROI with next appt.

## 2013-08-06 ENCOUNTER — Encounter: Payer: Self-pay | Admitting: Hematology and Oncology

## 2013-08-06 ENCOUNTER — Telehealth: Payer: Self-pay | Admitting: Hematology and Oncology

## 2013-08-06 ENCOUNTER — Ambulatory Visit (HOSPITAL_BASED_OUTPATIENT_CLINIC_OR_DEPARTMENT_OTHER): Payer: Medicare Other | Admitting: Hematology and Oncology

## 2013-08-06 VITALS — BP 122/51 | HR 81 | Temp 97.8°F | Resp 20 | Ht 65.0 in | Wt 199.3 lb

## 2013-08-06 DIAGNOSIS — C61 Malignant neoplasm of prostate: Secondary | ICD-10-CM

## 2013-08-06 DIAGNOSIS — C7951 Secondary malignant neoplasm of bone: Secondary | ICD-10-CM

## 2013-08-06 MED ORDER — BICALUTAMIDE 50 MG PO TABS: 50.0000 mg | ORAL_TABLET | Freq: Every day | ORAL | Status: DC

## 2013-08-06 NOTE — Progress Notes (Signed)
[name] OFFICE PROGRESS NOTE  Peter Rising, MD  DIAGNOSIS: Prostate cancer to the bone, ongoing treatment with Firmagon  SUMMARY OF ONCOLOGIC HISTORY: I reviewed his records extensively and collaborate at the history with the patient. This is a pleasant 79 year old gentleman presented in the hospital with weakness and subsequently was found to have widespread metastatic cancer with a PSA were 5000. He was started on hormonal manipulation in May of 2014 and currently on a maintain his treatment every month.  INTERVAL HISTORY: The patient was asked to come back to see me this week because his PSA from last week has doubled to 750. The patient remained asymptomatic. He denies any bone pain. His appetite is stable. He denies any difficulties with urination, urgency, frequency, or hematuria. I have reviewed the past medical history, past surgical history, social history and family history with the patient and they are unchanged from previous note.  ALLERGIES:  has No Known Allergies.  MEDICATIONS: Current outpatient prescriptions:bicalutamide (CASODEX) 50 MG tablet, Take 1 tablet (50 mg total) by mouth daily., Disp: 30 tablet, Rfl: 3;  calcium-vitamin D (OSCAL WITH D) 250-125 MG-UNIT per tablet, Take 1 tablet by mouth daily., Disp: , Rfl: ;  metFORMIN (GLUCOPHAGE-XR) 500 MG 24 hr tablet, TAKE ONE TABLET BY MOUTH EVERY DAY WITH BREAKFAST, Disp: 30 tablet, Rfl: 5  REVIEW OF SYSTEMS:   Constitutional: Denies fevers, chills or abnormal weight loss Eyes: Denies blurriness of vision Ears, nose, mouth, throat, and face: Denies mucositis or sore throat Respiratory: Denies cough, dyspnea or wheezes Cardiovascular: Denies palpitation, chest discomfort or lower extremity swelling Gastrointestinal:  Denies nausea, heartburn or change in bowel habits Skin: Denies abnormal skin rashes Lymphatics: Denies new lymphadenopathy or easy bruising Neurological:Denies numbness, tingling or new  weaknesses Behavioral/Psych: Mood is stable, no new changes  All other systems were reviewed with the patient and are negative.  PHYSICAL EXAMINATION: ECOG PERFORMANCE STATUS: 1 - Symptomatic but completely ambulatory  Filed Vitals:   08/06/13 1200  BP: 122/51  Pulse: 81  Temp: 97.8 F (36.6 C)  Resp: 20   Filed Weights   08/06/13 1200  Weight: 199 lb 4.8 oz (90.402 kg)    GENERAL:alert, no distress and comfortable NEURO: alert & oriented x 3 with fluent speech, no focal motor/sensory deficits except he is hard of hearing  LABORATORY DATA:  I have reviewed the data as listed    Component Value Date/Time   NA 140 07/27/2013 1247   NA 136 03/24/2013 0546   K 4.4 07/27/2013 1247   K 4.3 03/24/2013 0546   CL 103 04/23/2013 1238   CL 101 03/24/2013 0546   CO2 26 07/27/2013 1247   CO2 29 03/24/2013 0546   GLUCOSE 137 07/27/2013 1247   GLUCOSE 186* 04/23/2013 1238   GLUCOSE 150* 03/24/2013 0546   BUN 14.2 07/27/2013 1247   BUN 10 03/24/2013 0546   CREATININE 1.0 07/27/2013 1247   CREATININE 0.76 03/24/2013 0546   CREATININE 0.99 02/12/2012 1546   CALCIUM 8.9 07/27/2013 1247   CALCIUM 8.4 03/24/2013 0546   PROT 7.4 07/27/2013 1247   PROT 7.6 03/21/2013 2025   ALBUMIN 3.2* 07/27/2013 1247   ALBUMIN 3.3* 03/21/2013 2025   AST 43* 07/27/2013 1247   AST 74* 03/21/2013 2025   ALT 26 07/27/2013 1247   ALT 19 03/21/2013 2025   ALKPHOS 359* 07/27/2013 1247   ALKPHOS 1659* 03/21/2013 2025   BILITOT 0.55 07/27/2013 1247   BILITOT 0.6 03/21/2013 2025  GFRNONAA 77* 03/24/2013 0546   GFRAA 90* 03/24/2013 0546    No results found for this basename: SPEP, UPEP,  kappa and lambda light chains    Lab Results  Component Value Date   WBC 3.4* 07/27/2013   NEUTROABS 2.1 07/27/2013   HGB 9.7* 07/27/2013   HCT 28.0* 07/27/2013   MCV 92.8 07/27/2013   PLT 136* 07/27/2013      Chemistry      Component Value Date/Time   NA 140 07/27/2013 1247   NA 136 03/24/2013 0546   K 4.4 07/27/2013 1247   K 4.3 03/24/2013  0546   CL 103 04/23/2013 1238   CL 101 03/24/2013 0546   CO2 26 07/27/2013 1247   CO2 29 03/24/2013 0546   BUN 14.2 07/27/2013 1247   BUN 10 03/24/2013 0546   CREATININE 1.0 07/27/2013 1247   CREATININE 0.76 03/24/2013 0546   CREATININE 0.99 02/12/2012 1546      Component Value Date/Time   CALCIUM 8.9 07/27/2013 1247   CALCIUM 8.4 03/24/2013 0546   ALKPHOS 359* 07/27/2013 1247   ALKPHOS 1659* 03/21/2013 2025   AST 43* 07/27/2013 1247   AST 74* 03/21/2013 2025   ALT 26 07/27/2013 1247   ALT 19 03/21/2013 2025   BILITOT 0.55 07/27/2013 1247   BILITOT 0.6 03/21/2013 2025    ASSESSMENT: Metastatic prostate cancer, hormone refractory PLAN:  #1 metastatic prostate cancer Concern with the [name] PSA. He responded well to hormonal manipulation at first until this past month, his PSA has doubled. I recommend complete suppression using additional treatment with Casodex. We discussed some of the risk, benefits, and side effects of Casodex, including hot flashes, diarrhea, shortness of breath,  and he is in agreement to proceed. This would be prescribed in addition to the monthly injections. Previously, from prior visit, we also discussed about the use of intravenous bisphosphonates. We will discuss about using this agent in our next visit. Given his age, and comorbidities, I will see him back in 3 weeks for further assessment of toxicities. However I recommend his caregiver to call him on a daily basis and if you start to develop side effects of treatment, he will call me and I will see the patient back sooner.  All questions were answered. The patient knows to call the clinic with any problems, questions or concerns. We can certainly see the patient much sooner if necessary. No barriers to learning was detected. I spent 40 minutes counseling the patient face to face. The total time spent in the appointment was 60 minutes and more than 50% was on counseling and review of test results     [facility], Meron Bocchino, MD 08/06/2013  3:16 PM

## 2013-08-06 NOTE — Progress Notes (Signed)
Patients daughter advised that she is going to call the insurance company and get more cards. Because he lost his first cards and that they will bring the cards in the cancer center when they come in.

## 2013-08-06 NOTE — Patient Instructions (Addendum)
Bicalutamide tablets What is this medicine? BICALUTAMIDE (bye ka LOO ta mide) blocks the effect of the male hormone testosterone on the prostate. This medicine is used to treat advanced prostate cancer in men. It is given with other treatments. This medicine may be used for other purposes; ask your health care provider or pharmacist if you have questions. What should I tell my health care provider before I take this medicine? They need to know if you have any of these conditions: -if you are male (this medicine is not for use in women) -liver disease -an unusual or allergic reaction to bicalutamide, other medicines, foods, dyes, or preservatives How should I use this medicine? Take this medicine by mouth with a glass of water. You may take it with or without food. Follow the directions on the prescription label. Take your medicine at regular intervals. Do not take your medicine more often than directed. Do not stop taking except on your doctor's advice. Talk to your pediatrician regarding the use of this medicine in children. Special care may be needed. Overdosage: If you think you have taken too much of this medicine contact a poison control center or emergency room at once. NOTE: This medicine is only for you. Do not share this medicine with others. What if I miss a dose? If you miss a dose, take it as soon as you can. If it is almost time for your next dose, take only that dose. Do not take double or extra doses. What may interact with this medicine? -warfarin This list may not describe all possible interactions. Give your health care provider a list of all the medicines, herbs, non-prescription drugs, or dietary supplements you use. Also tell them if you smoke, drink alcohol, or use illegal drugs. Some items may interact with your medicine. What should I watch for while using this medicine? Visit your doctor or health care professional for regular checks on your progress. You may need  regular tests to make sure your liver is working properly. This medicine should not be used in women. Serious side effects to an unborn child are possible. Talk to your doctor or pharmacist for more information. What side effects may I notice from receiving this medicine? Side effects that you should report to your doctor or health care professional as soon as possible: -allergic reactions like skin rash, itching or hives, swelling of the face, lips, or tongue -blood in the urine -breathing problems -chest pain -dark urine -severe nausea and vomiting -yellowing of the eyes or skin Side effects that usually do not require medical attention (report to your doctor or health care professional if they continue or are bothersome): -diarrhea -hot flashes -loss of appetite -nausea -weak or tired This list may not describe all possible side effects. Call your doctor for medical advice about side effects. You may report side effects to FDA at 1-800-FDA-1088. Where should I keep my medicine? Keep out of the reach of children. Store between 20 and 25 degrees C (68 and 77 degrees F). Throw away any unused medicine after the expiration date. NOTE: This sheet is a summary. It may not cover all possible information. If you have questions about this medicine, talk to your doctor, pharmacist, or health care provider.  2013, [employer]. (01/05/2008 2:49:46 PM)

## 2013-08-06 NOTE — Telephone Encounter (Signed)
Gave pt appt for lab and MD on october 2014 °

## 2013-08-20 ENCOUNTER — Ambulatory Visit: Payer: Medicare Other

## 2013-08-21 ENCOUNTER — Other Ambulatory Visit: Payer: Self-pay | Admitting: Family Medicine

## 2013-08-21 MED ORDER — METFORMIN HCL ER 500 MG PO TB24: 500.0000 mg | ORAL_TABLET | Freq: Every day | ORAL | Status: DC

## 2013-08-26 ENCOUNTER — Ambulatory Visit: Payer: Medicare Other

## 2013-08-26 ENCOUNTER — Ambulatory Visit: Payer: Medicare Other | Admitting: Hematology and Oncology

## 2013-08-26 ENCOUNTER — Telehealth: Payer: Self-pay | Admitting: *Deleted

## 2013-08-26 ENCOUNTER — Other Ambulatory Visit: Payer: Medicare Other | Admitting: Lab

## 2013-08-26 ENCOUNTER — Telehealth: Payer: Self-pay | Admitting: Hematology and Oncology

## 2013-08-26 NOTE — Telephone Encounter (Signed)
See note below  Dr. Bertis Ruddy,   Mr. Schurman daughter Dennie Bible called today to reschedule his appts from today to 11/4. Per Dennie Bible she just moved here and is now taking care of Mr. Stanish. She says he has not been taking the chemo pill regularly but she has him taking it now and she wants to wait a little longer for follow up because of this issue. Dennie Bible can be reached @ [phone] should you need to contact her.   Thanks,  [employer]

## 2013-08-26 NOTE — Telephone Encounter (Signed)
VM left, but unable to understand woman's message.  It was breaking up.  Called phone number [phone] and left a message asking her to return nurse's call again to find out what pt needs.

## 2013-09-08 ENCOUNTER — Ambulatory Visit: Payer: Medicare Other | Admitting: Hematology and Oncology

## 2013-09-08 ENCOUNTER — Telehealth: Payer: Self-pay | Admitting: *Deleted

## 2013-09-08 ENCOUNTER — Ambulatory Visit: Payer: Medicare Other

## 2013-09-08 ENCOUNTER — Other Ambulatory Visit: Payer: Medicare Other | Admitting: Lab

## 2013-09-08 NOTE — Telephone Encounter (Signed)
VM from pt's daughter at 12;50 pm reports they are having car trouble and unable to make appt today at scheduled time.  Called back to dau cell ph [phone], and left VM instructing if they can make it in by 3 pm to come, otherwise call back to reschedule.

## 2013-09-11 ENCOUNTER — Telehealth: Payer: Self-pay | Admitting: Hematology and Oncology

## 2013-09-11 NOTE — Telephone Encounter (Signed)
Pt's daughter called and r/s missed appt to 11/10 lab,MD and injections

## 2013-09-14 ENCOUNTER — Encounter (INDEPENDENT_AMBULATORY_CARE_PROVIDER_SITE_OTHER): Payer: Self-pay

## 2013-09-14 ENCOUNTER — Other Ambulatory Visit: Payer: Self-pay | Admitting: Hematology and Oncology

## 2013-09-14 ENCOUNTER — Ambulatory Visit (HOSPITAL_BASED_OUTPATIENT_CLINIC_OR_DEPARTMENT_OTHER): Payer: Medicare Other | Admitting: Hematology and Oncology

## 2013-09-14 ENCOUNTER — Telehealth: Payer: Self-pay | Admitting: Hematology and Oncology

## 2013-09-14 ENCOUNTER — Encounter (HOSPITAL_BASED_OUTPATIENT_CLINIC_OR_DEPARTMENT_OTHER): Payer: Medicare Other | Admitting: Hematology and Oncology

## 2013-09-14 ENCOUNTER — Ambulatory Visit (HOSPITAL_BASED_OUTPATIENT_CLINIC_OR_DEPARTMENT_OTHER): Payer: Medicare Other

## 2013-09-14 VITALS — BP 114/72 | HR 78 | Temp 97.4°F | Resp 18 | Ht 65.0 in | Wt 200.1 lb

## 2013-09-14 DIAGNOSIS — D72819 Decreased white blood cell count, unspecified: Secondary | ICD-10-CM

## 2013-09-14 DIAGNOSIS — D649 Anemia, unspecified: Secondary | ICD-10-CM

## 2013-09-14 DIAGNOSIS — C7951 Secondary malignant neoplasm of bone: Secondary | ICD-10-CM

## 2013-09-14 DIAGNOSIS — Z5111 Encounter for antineoplastic chemotherapy: Secondary | ICD-10-CM

## 2013-09-14 DIAGNOSIS — C61 Malignant neoplasm of prostate: Secondary | ICD-10-CM

## 2013-09-14 DIAGNOSIS — E291 Testicular hypofunction: Secondary | ICD-10-CM

## 2013-09-14 LAB — COMPREHENSIVE METABOLIC PANEL (CC13)
ALT: 16 U/L (ref 0–55)
AST: 32 U/L (ref 5–34)
Albumin: 3.5 g/dL (ref 3.5–5.0)
Anion Gap: 11 mEq/L (ref 3–11)
BUN: 19.7 mg/dL (ref 7.0–26.0)
Calcium: 9.2 mg/dL (ref 8.4–10.4)
Creatinine: 0.9 mg/dL (ref 0.7–1.3)
Glucose: 94 mg/dl (ref 70–140)
Potassium: 4.5 mEq/L (ref 3.5–5.1)
Total Bilirubin: 0.63 mg/dL (ref 0.20–1.20)

## 2013-09-14 LAB — CBC WITH DIFFERENTIAL/PLATELET
BASO%: 0.8 % (ref 0.0–2.0)
Basophils Absolute: 0 10*3/uL (ref 0.0–0.1)
EOS%: 5.2 % (ref 0.0–7.0)
HCT: 26.5 % — ABNORMAL LOW (ref 38.4–49.9)
LYMPH%: 25.7 % (ref 14.0–49.0)
MCH: 31.1 pg (ref 27.2–33.4)
MCHC: 33.8 g/dL (ref 32.0–36.0)
MCV: 92 fL (ref 79.3–98.0)
MONO%: 9.2 % (ref 0.0–14.0)
NEUT%: 59.1 % (ref 39.0–75.0)
Platelets: 146 10*3/uL (ref 140–400)

## 2013-09-14 LAB — PHOSPHORUS: Phosphorus: 3 mg/dL (ref 2.3–4.6)

## 2013-09-14 LAB — MAGNESIUM (CC13): Magnesium: 2.2 mg/dL (ref 1.5–2.5)

## 2013-09-14 MED ORDER — DEGARELIX ACETATE 80 MG ~~LOC~~ SOLR
80.0000 mg | Freq: Once | SUBCUTANEOUS | Status: AC
  Administered 2013-09-14: 80 mg via SUBCUTANEOUS
  Filled 2013-09-14: qty 4

## 2013-09-14 NOTE — Telephone Encounter (Signed)
Gave pt appt for lab,MD  and injections for December 2014

## 2013-09-14 NOTE — Progress Notes (Signed)
[facility] OFFICE PROGRESS NOTE  Patient Care Team: Uvaldo Rising, MD as PCP - General (Family Medicine) Artis Delay, MD as Consulting Physician (Hematology and Oncology)  DIAGNOSIS: Metastatic prostate cancer, TX N1 M1 on complete androgen depravation  SUMMARY OF ONCOLOGIC HISTORY: I reviewed his records extensively and collaborate at the history with the patient. This is a pleasant 79 year old gentleman presented in the hospital with weakness and subsequently was found to have widespread metastatic cancer with a PSA were 5000. He was started on hormonal manipulation in May of 2014 and currently on a maintain his treatment every month. October 2014, PSA started to rise. Casodex was added.  INTERVAL HISTORY: Peter [name] 79 y.o. male returns for further followup. His Mrs. last injection due to transportation issue. He started taking Casodex around mid of October. He had symptomatic intermittent hot flashes but it does not bother him too much. He denies any recent fever, chills, night sweats or abnormal weight loss Denies any bone pain. No urinary difficulties.  I have reviewed the past medical history, past surgical history, social history and family history with the patient and they are unchanged from previous note.  ALLERGIES:  has No Known Allergies.  MEDICATIONS:  Current Outpatient Prescriptions  Medication Sig Dispense Refill  . bicalutamide (CASODEX) 50 MG tablet Take 1 tablet (50 mg total) by mouth daily.  30 tablet  3  . calcium-vitamin D (OSCAL WITH D) 250-125 MG-UNIT per tablet Take 1 tablet by mouth daily.      . metFORMIN (GLUCOPHAGE-XR) 500 MG 24 hr tablet Take 1 tablet (500 mg total) by mouth daily with breakfast.  90 tablet  1  . Multiple Vitamin (MULTIVITAMIN) tablet Take 1 tablet by mouth daily.       No current facility-administered medications for this visit.   Facility-Administered Medications Ordered in Other Visits  Medication Dose Route  Frequency Provider Last Rate Last Dose  . degarelix (FIRMAGON) injection 80 mg  80 mg Subcutaneous Once Artis Delay, MD        REVIEW OF SYSTEMS:   Constitutional: Denies fevers, chills or abnormal weight loss Eyes: Denies blurriness of vision Ears, nose, mouth, throat, and face: Denies mucositis or sore throat Respiratory: Denies cough, dyspnea or wheezes Cardiovascular: Denies palpitation, chest discomfort or lower extremity swelling Gastrointestinal:  Denies nausea, heartburn or change in bowel habits Skin: Denies abnormal skin rashes Lymphatics: Denies new lymphadenopathy or easy bruising Neurological:Denies numbness, tingling or new weaknesses Behavioral/Psych: Mood is stable, no new changes  All other systems were reviewed with the patient and are negative.  PHYSICAL EXAMINATION: ECOG PERFORMANCE STATUS: 1 - Symptomatic but completely ambulatory  Filed Vitals:   09/14/13 1525  BP: 114/72  Pulse: 78  Temp: 97.4 F (36.3 C)  Resp: 18   Filed Weights   09/14/13 1525  Weight: 200 lb 1.6 oz (90.765 kg)    GENERAL:alert, no distress and comfortable. Patient will obese SKIN: skin color, texture, turgor are normal, no rashes or significant lesions EYES: normal, Conjunctiva are pink and non-injected, sclera clear OROPHARYNX:no exudate, no erythema and lips, buccal mucosa, and tongue normal . Very poor dentition noted NECK: supple, thyroid normal size, non-tender, without nodularity LYMPH:  no palpable lymphadenopathy in the cervical, axillary or inguinal LUNGS: clear to auscultation and percussion with normal breathing effort HEART: regular rate & rhythm and no murmurs and no lower extremity edema ABDOMEN:abdomen soft, non-tender and normal bowel sounds Musculoskeletal:no cyanosis of digits and no clubbing  NEURO:  alert & oriented x 3 with fluent speech, no focal motor/sensory deficits  LABORATORY DATA:  I have reviewed the data as listed    Component Value Date/Time   NA  138 09/14/2013 1509   NA 136 03/24/2013 0546   K 4.5 09/14/2013 1509   K 4.3 03/24/2013 0546   CL 103 04/23/2013 1238   CL 101 03/24/2013 0546   CO2 24 09/14/2013 1509   CO2 29 03/24/2013 0546   GLUCOSE 94 09/14/2013 1509   GLUCOSE 186* 04/23/2013 1238   GLUCOSE 150* 03/24/2013 0546   BUN 19.7 09/14/2013 1509   BUN 10 03/24/2013 0546   CREATININE 0.9 09/14/2013 1509   CREATININE 0.76 03/24/2013 0546   CREATININE 0.99 02/12/2012 1546   CALCIUM 9.2 09/14/2013 1509   CALCIUM 8.4 03/24/2013 0546   PROT 7.2 09/14/2013 1509   PROT 7.6 03/21/2013 2025   ALBUMIN 3.5 09/14/2013 1509   ALBUMIN 3.3* 03/21/2013 2025   AST 32 09/14/2013 1509   AST 74* 03/21/2013 2025   ALT 16 09/14/2013 1509   ALT 19 03/21/2013 2025   ALKPHOS 644* 09/14/2013 1509   ALKPHOS 1659* 03/21/2013 2025   BILITOT 0.63 09/14/2013 1509   BILITOT 0.6 03/21/2013 2025   GFRNONAA 77* 03/24/2013 0546   GFRAA 90* 03/24/2013 0546    No results found for this basename: SPEP,  UPEP,   kappa and lambda light chains    Lab Results  Component Value Date   WBC 3.6* 09/14/2013   NEUTROABS 2.1 09/14/2013   HGB 8.9* 09/14/2013   HCT 26.5* 09/14/2013   MCV 92.0 09/14/2013   PLT 146 09/14/2013      Chemistry      Component Value Date/Time   NA 138 09/14/2013 1509   NA 136 03/24/2013 0546   K 4.5 09/14/2013 1509   K 4.3 03/24/2013 0546   CL 103 04/23/2013 1238   CL 101 03/24/2013 0546   CO2 24 09/14/2013 1509   CO2 29 03/24/2013 0546   BUN 19.7 09/14/2013 1509   BUN 10 03/24/2013 0546   CREATININE 0.9 09/14/2013 1509   CREATININE 0.76 03/24/2013 0546   CREATININE 0.99 02/12/2012 1546      Component Value Date/Time   CALCIUM 9.2 09/14/2013 1509   CALCIUM 8.4 03/24/2013 0546   ALKPHOS 644* 09/14/2013 1509   ALKPHOS 1659* 03/21/2013 2025   AST 32 09/14/2013 1509   AST 74* 03/21/2013 2025   ALT 16 09/14/2013 1509   ALT 19 03/21/2013 2025   BILITOT 0.63 09/14/2013 1509   BILITOT 0.6 03/21/2013 2025     ASSESSMENT & PLAN:  #1 metastatic  prostate cancer We're starting him on complete androgen deprivation by adding Casodex from our previous visit. PSA is pending. The patient have widespread bony metastasis. Ideally he should be started on IV bisphosphonates. However he has very poor dentition. The patient declined visiting the dentist. For that reason he's not on IV bisphosphonates. In the meantime, we will continue Casodex along with monthly Firmagon #2 anemia This is likely anemia of chronic disease. The patient denies recent history of bleeding such as epistaxis, hematuria or hematochezia. He is asymptomatic from the anemia. We will observe for now.  He does not require transfusion now.  #3 leukopenia The is because of bone marrow replacement by his cancer. He is not symptomatic. We will observe for now. Orders Placed This Encounter  Procedures  . CBC with Differential    Standing Status: Future     Number of  Occurrences:      Standing Expiration Date: 06/06/2014  . Comprehensive metabolic panel    Standing Status: Future     Number of Occurrences:      Standing Expiration Date: 09/14/2014  . PSA    Standing Status: Future     Number of Occurrences:      Standing Expiration Date: 09/14/2014  . Testosterone    Standing Status: Future     Number of Occurrences:      Standing Expiration Date: 09/14/2014   All questions were answered. The patient knows to call the clinic with any problems, questions or concerns. No barriers to learning was detected. I spent 25 minutes counseling the patient face to face. The total time spent in the appointment was 40 minutes and more than 50% was on counseling and review of test results     [facility], Oddis Westling, MD 09/14/2013 4:04 PM

## 2013-09-16 NOTE — Progress Notes (Signed)
This encounter was created in error - please disregard.

## 2013-10-09 ENCOUNTER — Encounter: Payer: Self-pay | Admitting: Internal Medicine

## 2013-10-09 ENCOUNTER — Telehealth: Payer: Self-pay | Admitting: Internal Medicine

## 2013-10-09 NOTE — Telephone Encounter (Signed)
Pt needs pacer fu with [facility], former [facility] pt, will send letter/mt

## 2013-10-12 ENCOUNTER — Other Ambulatory Visit: Payer: Medicare Other | Admitting: Lab

## 2013-10-12 ENCOUNTER — Ambulatory Visit: Payer: Medicare Other

## 2013-10-12 ENCOUNTER — Ambulatory Visit: Payer: Medicare Other | Admitting: Hematology and Oncology

## 2013-10-13 ENCOUNTER — Ambulatory Visit: Payer: Medicare Other | Admitting: Hematology and Oncology

## 2013-10-14 ENCOUNTER — Ambulatory Visit (HOSPITAL_BASED_OUTPATIENT_CLINIC_OR_DEPARTMENT_OTHER): Payer: Medicare Other

## 2013-10-14 ENCOUNTER — Telehealth: Payer: Self-pay | Admitting: Hematology and Oncology

## 2013-10-14 ENCOUNTER — Ambulatory Visit (HOSPITAL_BASED_OUTPATIENT_CLINIC_OR_DEPARTMENT_OTHER): Payer: Medicare Other | Admitting: Hematology and Oncology

## 2013-10-14 ENCOUNTER — Encounter: Payer: Self-pay | Admitting: Hematology and Oncology

## 2013-10-14 ENCOUNTER — Other Ambulatory Visit (HOSPITAL_BASED_OUTPATIENT_CLINIC_OR_DEPARTMENT_OTHER): Payer: Medicare Other

## 2013-10-14 VITALS — BP 111/51 | HR 74 | Temp 97.9°F | Resp 20 | Ht 65.0 in | Wt 202.3 lb

## 2013-10-14 DIAGNOSIS — C61 Malignant neoplasm of prostate: Secondary | ICD-10-CM

## 2013-10-14 DIAGNOSIS — C7951 Secondary malignant neoplasm of bone: Secondary | ICD-10-CM

## 2013-10-14 DIAGNOSIS — D649 Anemia, unspecified: Secondary | ICD-10-CM

## 2013-10-14 DIAGNOSIS — D72819 Decreased white blood cell count, unspecified: Secondary | ICD-10-CM

## 2013-10-14 DIAGNOSIS — Z5111 Encounter for antineoplastic chemotherapy: Secondary | ICD-10-CM

## 2013-10-14 LAB — COMPREHENSIVE METABOLIC PANEL (CC13)
ALT: 20 U/L (ref 0–55)
AST: 34 U/L (ref 5–34)
Alkaline Phosphatase: 509 U/L — ABNORMAL HIGH (ref 40–150)
BUN: 17 mg/dL (ref 7.0–26.0)
Calcium: 9.2 mg/dL (ref 8.4–10.4)
Chloride: 103 mEq/L (ref 98–109)
Creatinine: 1.1 mg/dL (ref 0.7–1.3)
Total Bilirubin: 0.64 mg/dL (ref 0.20–1.20)
Total Protein: 7.4 g/dL (ref 6.4–8.3)

## 2013-10-14 LAB — CBC WITH DIFFERENTIAL/PLATELET
BASO%: 1 % (ref 0.0–2.0)
EOS%: 3.1 % (ref 0.0–7.0)
HCT: 27.5 % — ABNORMAL LOW (ref 38.4–49.9)
LYMPH%: 25 % (ref 14.0–49.0)
MCH: 31.6 pg (ref 27.2–33.4)
MCHC: 34.2 g/dL (ref 32.0–36.0)
MCV: 92.4 fL (ref 79.3–98.0)
MONO%: 9.6 % (ref 0.0–14.0)
NEUT%: 61.3 % (ref 39.0–75.0)
Platelets: 139 10*3/uL — ABNORMAL LOW (ref 140–400)
lymph#: 0.8 10*3/uL — ABNORMAL LOW (ref 0.9–3.3)

## 2013-10-14 LAB — TESTOSTERONE: Testosterone: 17 ng/dL — ABNORMAL LOW (ref 300–890)

## 2013-10-14 MED ORDER — DEGARELIX ACETATE 80 MG ~~LOC~~ SOLR
80.0000 mg | SUBCUTANEOUS | Status: DC
  Administered 2013-10-14: 80 mg via SUBCUTANEOUS
  Filled 2013-10-14: qty 4

## 2013-10-14 NOTE — Telephone Encounter (Signed)
appts made per 12/10 POF AVS and CAL mailed to pt [facility]

## 2013-10-14 NOTE — Progress Notes (Signed)
[facility] OFFICE PROGRESS NOTE  Patient Care Team: Uvaldo Rising, MD as PCP - General (Family Medicine) Artis Delay, MD as Consulting Physician (Hematology and Oncology)  DIAGNOSIS: Metastatic prostate cancer, TX N1 M1 with androgen depravation therapy  SUMMARY OF ONCOLOGIC HISTORY: I reviewed his records extensively and collaborate at the history with the patient. This is a pleasant 79 year old gentleman presented in the hospital with weakness and subsequently was found to have widespread metastatic cancer with a PSA were 5000. He was started on hormonal manipulation in May of 2014 and currently on a maintain his treatment every month. October 2014, PSA started to rise. Casodex was added.  INTERVAL HISTORY: Peter [name] 79 y.o. male returns for further visit. In November 2014, his PSA got worse. However at that time the PSA was drawn, he just started on Casodex. The patient has not received bisphosphonates due to poor dentition. He declined dental visits He denies any bone pain. His energy level is fair. He denies any recent fever, chills, night sweats or abnormal weight loss Denies hot flashes. The patient is noted to be noncompliance due to his forgetfulness of taking his Casodex. He admitted to missing several doses this past month.  I have reviewed the past medical history, past surgical history, social history and family history with the patient and they are unchanged from previous note.  ALLERGIES:  has No Known Allergies.  MEDICATIONS:  Current Outpatient Prescriptions  Medication Sig Dispense Refill  . bicalutamide (CASODEX) 50 MG tablet Take 1 tablet (50 mg total) by mouth daily.  30 tablet  3  . calcium-vitamin D (OSCAL WITH D) 250-125 MG-UNIT per tablet Take 1 tablet by mouth daily.      . Coenzyme Q10 (CO Q 10) 100 MG CAPS Take by mouth. Takes 200 mg      . metFORMIN (GLUCOPHAGE-XR) 500 MG 24 hr tablet Take 1 tablet (500 mg total) by mouth daily  with breakfast.  90 tablet  1  . Multiple Vitamin (MULTIVITAMIN) tablet Take 1 tablet by mouth daily.       No current facility-administered medications for this visit.   Facility-Administered Medications Ordered in Other Visits  Medication Dose Route Frequency Provider Last Rate Last Dose  . degarelix (FIRMAGON) injection 80 mg  80 mg Subcutaneous Q28 days Artis Delay, MD        REVIEW OF SYSTEMS:   Constitutional: Denies fevers, chills or abnormal weight loss Eyes: Denies blurriness of vision Ears, nose, mouth, throat, and face: Denies mucositis or sore throat Respiratory: Denies cough, dyspnea or wheezes Cardiovascular: Denies palpitation, chest discomfort or lower extremity swelling Gastrointestinal:  Denies nausea, heartburn or change in bowel habits Skin: Denies abnormal skin rashes Lymphatics: Denies new lymphadenopathy or easy bruising Neurological:Denies numbness, tingling or new weaknesses Behavioral/Psych: Mood is stable, no new changes  All other systems were reviewed with the patient and are negative.  PHYSICAL EXAMINATION: ECOG PERFORMANCE STATUS: 1 - Symptomatic but completely ambulatory  Filed Vitals:   10/14/13 1512  BP: 111/51  Pulse: 74  Temp: 97.9 F (36.6 C)  Resp: 20   Filed Weights   10/14/13 1512  Weight: 202 lb 4.8 oz (91.763 kg)    GENERAL:alert, no distress and comfortable. He looked elderly SKIN: skin color, texture, turgor are normal, no rashes or significant lesions EYES: normal, Conjunctiva are pink and non-injected, sclera clear OROPHARYNX:no exudate, no erythema and lips, buccal mucosa, and tongue normal . Poor dentition is noted NECK: supple, thyroid  normal size, non-tender, without nodularity LYMPH:  no palpable lymphadenopathy in the cervical, axillary or inguinal LUNGS: clear to auscultation and percussion with normal breathing effort HEART: regular rate & rhythm and no murmurs and no lower extremity edema ABDOMEN:abdomen soft,  non-tender and normal bowel sounds Musculoskeletal:no cyanosis of digits and no clubbing  NEURO: alert & oriented x 3 with fluent speech, no focal motor/sensory deficits  LABORATORY DATA:  I have reviewed the data as listed    Component Value Date/Time   NA 139 10/14/2013 1502   NA 136 03/24/2013 0546   K 4.3 10/14/2013 1502   K 4.3 03/24/2013 0546   CL 103 04/23/2013 1238   CL 101 03/24/2013 0546   CO2 25 10/14/2013 1502   CO2 29 03/24/2013 0546   GLUCOSE 126 10/14/2013 1502   GLUCOSE 186* 04/23/2013 1238   GLUCOSE 150* 03/24/2013 0546   BUN 17.0 10/14/2013 1502   BUN 10 03/24/2013 0546   CREATININE 1.1 10/14/2013 1502   CREATININE 0.76 03/24/2013 0546   CREATININE 0.99 02/12/2012 1546   CALCIUM 9.2 10/14/2013 1502   CALCIUM 8.4 03/24/2013 0546   PROT 7.4 10/14/2013 1502   PROT 7.6 03/21/2013 2025   ALBUMIN 3.5 10/14/2013 1502   ALBUMIN 3.3* 03/21/2013 2025   AST 34 10/14/2013 1502   AST 74* 03/21/2013 2025   ALT 20 10/14/2013 1502   ALT 19 03/21/2013 2025   ALKPHOS 509* 10/14/2013 1502   ALKPHOS 1659* 03/21/2013 2025   BILITOT 0.64 10/14/2013 1502   BILITOT 0.6 03/21/2013 2025   GFRNONAA 77* 03/24/2013 0546   GFRAA 90* 03/24/2013 0546    No results found for this basename: SPEP,  UPEP,   kappa and lambda light chains    Lab Results  Component Value Date   WBC 3.2* 10/14/2013   NEUTROABS 2.0 10/14/2013   HGB 9.4* 10/14/2013   HCT 27.5* 10/14/2013   MCV 92.4 10/14/2013   PLT 139* 10/14/2013      Chemistry      Component Value Date/Time   NA 139 10/14/2013 1502   NA 136 03/24/2013 0546   K 4.3 10/14/2013 1502   K 4.3 03/24/2013 0546   CL 103 04/23/2013 1238   CL 101 03/24/2013 0546   CO2 25 10/14/2013 1502   CO2 29 03/24/2013 0546   BUN 17.0 10/14/2013 1502   BUN 10 03/24/2013 0546   CREATININE 1.1 10/14/2013 1502   CREATININE 0.76 03/24/2013 0546   CREATININE 0.99 02/12/2012 1546      Component Value Date/Time   CALCIUM 9.2 10/14/2013 1502   CALCIUM 8.4 03/24/2013 0546    ALKPHOS 509* 10/14/2013 1502   ALKPHOS 1659* 03/21/2013 2025   AST 34 10/14/2013 1502   AST 74* 03/21/2013 2025   ALT 20 10/14/2013 1502   ALT 19 03/21/2013 2025   BILITOT 0.64 10/14/2013 1502   BILITOT 0.6 03/21/2013 2025      ASSESSMENT & PLAN:  #1 metastatic prostate cancer We started him on complete androgen deprivation by adding Casodex from our previous visit. PSA is pending. The patient have widespread bony metastasis. Ideally he should be started on IV bisphosphonates. However he has very poor dentition. The patient declined visiting the dentist. For that reason he's not on IV bisphosphonates. In the meantime, we will continue Casodex along with monthly Firmagon. If repeat PSA showed his disease is not responding to Casodex, we will switch treatment to abiraterone.  We discussed the role of therapy. The intent is for palliative.  We discussed some of the risks, benefits, side-effects of abiraterone   Some of the short term side-effects included, though not limited to, risk of fatigue, weight loss, pancytopenia, life-threatening infections, need for transfusions of blood products, change in bowel habits, blood clots, admission to hospital for various reasons, and risks of death.  After a long discussion, patient made an informed decision to proceed with the prescribed plan of care and went ahead to sign the consent form today.   Patient education material was dispensed  #2 anemia This is likely anemia of chronic disease. The patient denies recent history of bleeding such as epistaxis, hematuria or hematochezia. He is asymptomatic from the anemia. We will observe for now.  He does not require transfusion now.  #3 leukopenia The is because of bone marrow replacement by his cancer. He is not symptomatic. We will observe for now. #4 disability I fill an application to get him disability parking. Orders Placed This Encounter  Procedures  . Comprehensive metabolic panel    Standing  Status: Future     Number of Occurrences:      Standing Expiration Date: 10/14/2014  . CBC with Differential    Standing Status: Future     Number of Occurrences:      Standing Expiration Date: 07/06/2014  . PSA    Standing Status: Future     Number of Occurrences:      Standing Expiration Date: 10/14/2014  . Testosterone    Standing Status: Future     Number of Occurrences:      Standing Expiration Date: 10/14/2014   All questions were answered. The patient knows to call the clinic with any problems, questions or concerns. No barriers to learning was detected.    [facility], Summer Parthasarathy, MD 10/14/2013 3:51 PM

## 2013-10-15 ENCOUNTER — Other Ambulatory Visit: Payer: Self-pay | Admitting: Hematology and Oncology

## 2013-10-15 ENCOUNTER — Encounter: Payer: Self-pay | Admitting: Hematology and Oncology

## 2013-10-15 DIAGNOSIS — C61 Malignant neoplasm of prostate: Secondary | ICD-10-CM

## 2013-10-15 LAB — PSA: PSA: 1341 ng/mL — ABNORMAL HIGH (ref <=4.00)

## 2013-10-15 MED ORDER — ABIRATERONE ACETATE 250 MG PO TABS: 1000.0000 mg | ORAL_TABLET | Freq: Every day | ORAL | Status: DC

## 2013-10-15 NOTE — Progress Notes (Signed)
Reviewed the patient's recent PSA. He is not responding to Casodex. I am going to switch his treatment to [facility].

## 2013-10-15 NOTE — Progress Notes (Signed)
[facility], [id number], approved zytiga from 10/15/13-10/15/14.

## 2013-10-16 ENCOUNTER — Encounter: Payer: Self-pay | Admitting: *Deleted

## 2013-10-16 ENCOUNTER — Encounter: Payer: Self-pay | Admitting: Hematology and Oncology

## 2013-10-16 NOTE — Progress Notes (Signed)
Faxed zytiga prescription to [facility].

## 2013-10-27 ENCOUNTER — Telehealth: Payer: Self-pay | Admitting: *Deleted

## 2013-10-27 NOTE — Telephone Encounter (Signed)
Called dau back and left Vm for her to return my call.

## 2013-10-27 NOTE — Telephone Encounter (Signed)
Yes, hold chemo Ask if he needs to come early to see me 12/31 (my next available)

## 2013-10-27 NOTE — Telephone Encounter (Signed)
Left another VM for daughter to return nurse's call regarding the VM she left earlier today.

## 2013-10-27 NOTE — Telephone Encounter (Signed)
Dau left VM states very concerned that pt's appetite has dropped to almost none since starting on Zytiga.  He also c/o leg weakness and fell yesterday.  Asks if pt should hold med or reduce dose?  She is very concerned about these side effects.

## 2013-10-28 ENCOUNTER — Telehealth: Payer: Self-pay | Admitting: *Deleted

## 2013-10-28 ENCOUNTER — Telehealth: Payer: Self-pay | Admitting: Hematology and Oncology

## 2013-10-28 ENCOUNTER — Other Ambulatory Visit: Payer: Self-pay | Admitting: Hematology and Oncology

## 2013-10-28 NOTE — Telephone Encounter (Signed)
s.w. pt daughter and advised on 12.31.14 appt...ok and aware

## 2013-10-28 NOTE — Telephone Encounter (Signed)
Informed Peter [name],  Of order to [facility] for pt to come in on 12/31 at 2 pm for lab and office visit.  She verbalized understanding.

## 2013-11-04 ENCOUNTER — Encounter: Payer: Self-pay | Admitting: Hematology and Oncology

## 2013-11-04 ENCOUNTER — Ambulatory Visit (HOSPITAL_BASED_OUTPATIENT_CLINIC_OR_DEPARTMENT_OTHER): Payer: Medicare Other | Admitting: Hematology and Oncology

## 2013-11-04 ENCOUNTER — Other Ambulatory Visit (HOSPITAL_BASED_OUTPATIENT_CLINIC_OR_DEPARTMENT_OTHER): Payer: Medicare Other

## 2013-11-04 VITALS — BP 99/45 | HR 83 | Temp 98.3°F | Resp 20 | Ht 65.0 in | Wt 205.4 lb

## 2013-11-04 DIAGNOSIS — C7951 Secondary malignant neoplasm of bone: Secondary | ICD-10-CM

## 2013-11-04 DIAGNOSIS — C61 Malignant neoplasm of prostate: Secondary | ICD-10-CM

## 2013-11-04 DIAGNOSIS — D72819 Decreased white blood cell count, unspecified: Secondary | ICD-10-CM

## 2013-11-04 DIAGNOSIS — R5381 Other malaise: Secondary | ICD-10-CM

## 2013-11-04 DIAGNOSIS — D649 Anemia, unspecified: Secondary | ICD-10-CM

## 2013-11-04 DIAGNOSIS — R630 Anorexia: Secondary | ICD-10-CM

## 2013-11-04 DIAGNOSIS — C779 Secondary and unspecified malignant neoplasm of lymph node, unspecified: Secondary | ICD-10-CM

## 2013-11-04 LAB — COMPREHENSIVE METABOLIC PANEL (CC13)
ALT: 15 U/L (ref 0–55)
AST: 38 U/L — ABNORMAL HIGH (ref 5–34)
Alkaline Phosphatase: 497 U/L — ABNORMAL HIGH (ref 40–150)
CO2: 22 mEq/L (ref 22–29)
Calcium: 8.9 mg/dL (ref 8.4–10.4)
Chloride: 104 mEq/L (ref 98–109)
Creatinine: 1.1 mg/dL (ref 0.7–1.3)
Total Bilirubin: 1 mg/dL (ref 0.20–1.20)
Total Protein: 7.4 g/dL (ref 6.4–8.3)

## 2013-11-04 LAB — CBC WITH DIFFERENTIAL/PLATELET
BASO%: 0.9 % (ref 0.0–2.0)
EOS%: 5.3 % (ref 0.0–7.0)
HCT: 28 % — ABNORMAL LOW (ref 38.4–49.9)
LYMPH%: 24.4 % (ref 14.0–49.0)
MCH: 31.7 pg (ref 27.2–33.4)
MCHC: 34.2 g/dL (ref 32.0–36.0)
MCV: 92.6 fL (ref 79.3–98.0)
MONO#: 0.3 10*3/uL (ref 0.1–0.9)
NEUT#: 2.2 10*3/uL (ref 1.5–6.5)
NEUT%: 62.2 % (ref 39.0–75.0)
RBC: 3.02 10*6/uL — ABNORMAL LOW (ref 4.20–5.82)
WBC: 3.6 10*3/uL — ABNORMAL LOW (ref 4.0–10.3)
lymph#: 0.9 10*3/uL (ref 0.9–3.3)

## 2013-11-04 LAB — PSA: PSA: 1526 ng/mL — ABNORMAL HIGH (ref <=4.00)

## 2013-11-04 MED ORDER — PREDNISONE 5 MG PO TABS: 5.0000 mg | ORAL_TABLET | Freq: Two times a day (BID) | ORAL | Status: DC

## 2013-11-04 NOTE — Progress Notes (Signed)
[facility] OFFICE PROGRESS NOTE  Patient Care Team: Uvaldo Rising, MD as PCP - General (Family Medicine) Artis Delay, MD as Consulting Physician (Hematology and Oncology)  DIAGNOSIS: Metastatic prostate cancer, [place] N1 M1  SUMMARY OF ONCOLOGIC HISTORY: I reviewed his records extensively and collaborate at the history with the patient. This is a pleasant 79 year old gentleman presented in the hospital with weakness and subsequently was found to have widespread metastatic cancer with a PSA were 5000. He never had a biopsy performed due to his age He was started on hormonal manipulation in May of 2014 and currently on a maintain his treatment every month. October 2014, PSA started to rise. Casodex was added. December 2014, PSA continue to rise. Casodex was stopped and he was switched to Zytiga INTERVAL HISTORY: Peter [name] 79 y.o. male returns for further followup. After he took Zytiga for several days he had to stop because he was feeling weak. Since discontinuation of the pill, he said he is improving and have more appetite. He denies any recent fever, chills, night sweats or abnormal weight loss He denies any bone pain.  I have reviewed the past medical history, past surgical history, social history and family history with the patient and they are unchanged from previous note.  ALLERGIES:  has No Known Allergies.  MEDICATIONS:  Current Outpatient Prescriptions  Medication Sig Dispense Refill  . calcium-vitamin D (OSCAL WITH D) 250-125 MG-UNIT per tablet Take 1 tablet by mouth daily.      . Coenzyme Q10 (CO Q 10) 100 MG CAPS Take by mouth. Takes 200 mg      . metFORMIN (GLUCOPHAGE-XR) 500 MG 24 hr tablet Take 1 tablet (500 mg total) by mouth daily with breakfast.  90 tablet  1  . Multiple Vitamin (MULTIVITAMIN) tablet Take 1 tablet by mouth daily.      Marland Kitchen abiraterone Acetate (ZYTIGA) 250 MG tablet Take 4 tablets (1,000 mg total) by mouth daily. Take on an empty  stomach 1 hour before or 2 hours after a meal  120 tablet  0  . predniSONE (DELTASONE) 5 MG tablet Take 1 tablet (5 mg total) by mouth 2 (two) times daily with a meal.  60 tablet  2   No current facility-administered medications for this visit.    REVIEW OF SYSTEMS:   Eyes: Denies blurriness of vision Ears, nose, mouth, throat, and face: Denies mucositis or sore throat Respiratory: Denies cough, dyspnea or wheezes Cardiovascular: Denies palpitation, chest discomfort or lower extremity swelling Gastrointestinal:  Denies nausea, heartburn or change in bowel habits Skin: Denies abnormal skin rashes Lymphatics: Denies new lymphadenopathy or easy bruising Neurological:Denies numbness, tingling or new weaknesses Behavioral/Psych: Mood is stable, no new changes  All other systems were reviewed with the patient and are negative.  PHYSICAL EXAMINATION: ECOG PERFORMANCE STATUS: 1 - Symptomatic but completely ambulatory  Filed Vitals:   11/04/13 1448  BP: 99/45  Pulse: 83  Temp: 98.3 F (36.8 C)  Resp: 20   Filed Weights   11/04/13 1448  Weight: 205 lb 6.4 oz (93.169 kg)    GENERAL:alert, no distress and comfortable. He looked elderly but no distress SKIN: He has significant keratosis EYES: normal, Conjunctiva are pink and non-injected, sclera clear OROPHARYNX:no exudate, no erythema and lips, buccal mucosa, and tongue normal . Poor dentition is noted NECK: supple, thyroid normal size, non-tender, without nodularity LYMPH:  no palpable lymphadenopathy in the cervical, axillary or inguinal LUNGS: clear to auscultation and percussion with normal  breathing effort HEART: regular rate & rhythm and no murmurs and no lower extremity edema ABDOMEN:abdomen soft, non-tender and normal bowel sounds Musculoskeletal:no cyanosis of digits and no clubbing  NEURO: alert & oriented x 3 with fluent speech, no focal motor/sensory deficits  LABORATORY DATA:  I have reviewed the data as listed     Component Value Date/Time   NA 138 11/04/2013 1433   NA 136 03/24/2013 0546   K 4.1 11/04/2013 1433   K 4.3 03/24/2013 0546   CL 103 04/23/2013 1238   CL 101 03/24/2013 0546   CO2 22 11/04/2013 1433   CO2 29 03/24/2013 0546   GLUCOSE 114 11/04/2013 1433   GLUCOSE 186* 04/23/2013 1238   GLUCOSE 150* 03/24/2013 0546   BUN 9.6 11/04/2013 1433   BUN 10 03/24/2013 0546   CREATININE 1.1 11/04/2013 1433   CREATININE 0.76 03/24/2013 0546   CREATININE 0.99 02/12/2012 1546   CALCIUM 8.9 11/04/2013 1433   CALCIUM 8.4 03/24/2013 0546   PROT 7.4 11/04/2013 1433   PROT 7.6 03/21/2013 2025   ALBUMIN 3.6 11/04/2013 1433   ALBUMIN 3.3* 03/21/2013 2025   AST 38* 11/04/2013 1433   AST 74* 03/21/2013 2025   ALT 15 11/04/2013 1433   ALT 19 03/21/2013 2025   ALKPHOS 497* 11/04/2013 1433   ALKPHOS 1659* 03/21/2013 2025   BILITOT 1.00 11/04/2013 1433   BILITOT 0.6 03/21/2013 2025   GFRNONAA 77* 03/24/2013 0546   GFRAA 90* 03/24/2013 0546    No results found for this basename: SPEP,  UPEP,   kappa and lambda light chains    Lab Results  Component Value Date   WBC 3.6* 11/04/2013   NEUTROABS 2.2 11/04/2013   HGB 9.6* 11/04/2013   HCT 28.0* 11/04/2013   MCV 92.6 11/04/2013   PLT 171 11/04/2013      Chemistry      Component Value Date/Time   NA 138 11/04/2013 1433   NA 136 03/24/2013 0546   K 4.1 11/04/2013 1433   K 4.3 03/24/2013 0546   CL 103 04/23/2013 1238   CL 101 03/24/2013 0546   CO2 22 11/04/2013 1433   CO2 29 03/24/2013 0546   BUN 9.6 11/04/2013 1433   BUN 10 03/24/2013 0546   CREATININE 1.1 11/04/2013 1433   CREATININE 0.76 03/24/2013 0546   CREATININE 0.99 02/12/2012 1546      Component Value Date/Time   CALCIUM 8.9 11/04/2013 1433   CALCIUM 8.4 03/24/2013 0546   ALKPHOS 497* 11/04/2013 1433   ALKPHOS 1659* 03/21/2013 2025   AST 38* 11/04/2013 1433   AST 74* 03/21/2013 2025   ALT 15 11/04/2013 1433   ALT 19 03/21/2013 2025   BILITOT 1.00 11/04/2013 1433   BILITOT 0.6 03/21/2013 2025      ASSESSMENT & PLAN:  #1 metastatic prostate cancer to the lymph nodes and bone We started him on complete androgen deprivation by adding Casodex from our previous visit. PSA is still rising The patient have widespread bony metastasis. Ideally he should be started on IV bisphosphonates. However he has very poor dentition. The patient declined visiting the dentist. For that reason he's not on IV bisphosphonates. In the meantime, we will continue with monthly Firmagon. The patient had major side effects after starting on Zytiga He is improving since his treatment was placed on hold. I'm adding low-dose prednisone twice a day and will restart Zytiga at 50% dose adjustment. I will see him back in 2 weeks for further assessment. #2 anemia This  is likely anemia of chronic disease. The patient denies recent history of bleeding such as epistaxis, hematuria or hematochezia. He is asymptomatic from the anemia. We will observe for now.  He does not require transfusion now.  #3 leukopenia The is because of bone marrow replacement by his cancer. He is not symptomatic. We will observe for now. #4 disability He loss his recent disability parking permit. I fill an application to get him disability parking. #5 anorexia #6 fatigued This could be due to cancer. Hopefully by giving him low-dose prednisone, he will feel much improvement of energy level and appetite  All questions were answered. The patient knows to call the clinic with any problems, questions or concerns. No barriers to learning was detected.    [facility], Kinslei Labine, MD 11/04/2013 4:17 PM

## 2013-11-17 ENCOUNTER — Ambulatory Visit: Payer: Medicare Other | Admitting: Hematology and Oncology

## 2013-11-17 ENCOUNTER — Ambulatory Visit: Payer: Medicare Other

## 2013-11-17 ENCOUNTER — Other Ambulatory Visit: Payer: Self-pay | Admitting: Hematology and Oncology

## 2013-11-17 ENCOUNTER — Encounter: Payer: Self-pay | Admitting: Internal Medicine

## 2013-11-17 ENCOUNTER — Other Ambulatory Visit: Payer: Medicare Other

## 2013-11-17 ENCOUNTER — Telehealth: Payer: Self-pay | Admitting: Hematology and Oncology

## 2013-11-17 DIAGNOSIS — C61 Malignant neoplasm of prostate: Secondary | ICD-10-CM

## 2013-11-17 NOTE — Telephone Encounter (Signed)
Pt's daughter called and r/s appt for lab,md and injection for today to next week, nurse notified

## 2013-11-25 ENCOUNTER — Other Ambulatory Visit (HOSPITAL_BASED_OUTPATIENT_CLINIC_OR_DEPARTMENT_OTHER): Payer: Medicare Other

## 2013-11-25 ENCOUNTER — Telehealth: Payer: Self-pay | Admitting: Hematology and Oncology

## 2013-11-25 ENCOUNTER — Ambulatory Visit (HOSPITAL_BASED_OUTPATIENT_CLINIC_OR_DEPARTMENT_OTHER): Payer: Medicare Other

## 2013-11-25 ENCOUNTER — Encounter: Payer: Self-pay | Admitting: Hematology and Oncology

## 2013-11-25 ENCOUNTER — Ambulatory Visit (HOSPITAL_BASED_OUTPATIENT_CLINIC_OR_DEPARTMENT_OTHER): Payer: Medicare Other | Admitting: Hematology and Oncology

## 2013-11-25 VITALS — BP 96/64 | HR 78 | Temp 98.5°F | Resp 18 | Ht 65.0 in | Wt 195.2 lb

## 2013-11-25 DIAGNOSIS — C61 Malignant neoplasm of prostate: Secondary | ICD-10-CM

## 2013-11-25 DIAGNOSIS — R5383 Other fatigue: Secondary | ICD-10-CM

## 2013-11-25 DIAGNOSIS — C779 Secondary and unspecified malignant neoplasm of lymph node, unspecified: Secondary | ICD-10-CM

## 2013-11-25 DIAGNOSIS — R630 Anorexia: Secondary | ICD-10-CM

## 2013-11-25 DIAGNOSIS — Z5111 Encounter for antineoplastic chemotherapy: Secondary | ICD-10-CM

## 2013-11-25 DIAGNOSIS — D696 Thrombocytopenia, unspecified: Secondary | ICD-10-CM

## 2013-11-25 DIAGNOSIS — R5381 Other malaise: Secondary | ICD-10-CM

## 2013-11-25 DIAGNOSIS — C7952 Secondary malignant neoplasm of bone marrow: Secondary | ICD-10-CM

## 2013-11-25 DIAGNOSIS — D649 Anemia, unspecified: Secondary | ICD-10-CM

## 2013-11-25 DIAGNOSIS — E291 Testicular hypofunction: Secondary | ICD-10-CM

## 2013-11-25 DIAGNOSIS — C7951 Secondary malignant neoplasm of bone: Secondary | ICD-10-CM

## 2013-11-25 LAB — COMPREHENSIVE METABOLIC PANEL (CC13)
ALT: 12 U/L (ref 0–55)
AST: 26 U/L (ref 5–34)
Albumin: 3.6 g/dL (ref 3.5–5.0)
Alkaline Phosphatase: 406 U/L — ABNORMAL HIGH (ref 40–150)
Anion Gap: 8 mEq/L (ref 3–11)
BUN: 18.9 mg/dL (ref 7.0–26.0)
CALCIUM: 9.4 mg/dL (ref 8.4–10.4)
CHLORIDE: 105 meq/L (ref 98–109)
CO2: 24 mEq/L (ref 22–29)
Creatinine: 1 mg/dL (ref 0.7–1.3)
Glucose: 124 mg/dl (ref 70–140)
POTASSIUM: 4.7 meq/L (ref 3.5–5.1)
Sodium: 137 mEq/L (ref 136–145)
TOTAL PROTEIN: 7 g/dL (ref 6.4–8.3)
Total Bilirubin: 0.72 mg/dL (ref 0.20–1.20)

## 2013-11-25 LAB — CBC WITH DIFFERENTIAL/PLATELET
BASO%: 0.2 % (ref 0.0–2.0)
Basophils Absolute: 0 10*3/uL (ref 0.0–0.1)
EOS%: 1.1 % (ref 0.0–7.0)
Eosinophils Absolute: 0.1 10*3/uL (ref 0.0–0.5)
HCT: 28.5 % — ABNORMAL LOW (ref 38.4–49.9)
HEMOGLOBIN: 9.8 g/dL — AB (ref 13.0–17.1)
LYMPH#: 0.8 10*3/uL — AB (ref 0.9–3.3)
LYMPH%: 18.1 % (ref 14.0–49.0)
MCH: 31.7 pg (ref 27.2–33.4)
MCHC: 34.4 g/dL (ref 32.0–36.0)
MCV: 92.2 fL (ref 79.3–98.0)
MONO#: 0.3 10*3/uL (ref 0.1–0.9)
MONO%: 6.8 % (ref 0.0–14.0)
NEUT#: 3.4 10*3/uL (ref 1.5–6.5)
NEUT%: 73.8 % (ref 39.0–75.0)
Platelets: 111 10*3/uL — ABNORMAL LOW (ref 140–400)
RBC: 3.09 10*6/uL — AB (ref 4.20–5.82)
RDW: 14.7 % — AB (ref 11.0–14.6)
WBC: 4.6 10*3/uL (ref 4.0–10.3)

## 2013-11-25 MED ORDER — DEGARELIX ACETATE 80 MG ~~LOC~~ SOLR
80.0000 mg | SUBCUTANEOUS | Status: DC
  Administered 2013-11-25: 80 mg via SUBCUTANEOUS
  Filled 2013-11-25: qty 4

## 2013-11-25 NOTE — Progress Notes (Signed)
[place] OFFICE PROGRESS NOTE  Patient Care Team: Lupita [name], MD as PCP - General (Family Medicine) Heath Lark, MD as Consulting Physician (Hematology and Oncology)  DIAGNOSIS: Metastatic prostate cancer  SUMMARY OF ONCOLOGIC HISTORY: This is a pleasant 80 year old gentleman presented in the hospital with weakness and subsequently was found to have widespread metastatic cancer with a PSA were 5000. He never had a biopsy performed due to his age He was started on hormonal manipulation in May of 2014 and currently on a maintain his treatment every month. October 2014, PSA started to rise. Casodex was added. December 2014, PSA continue to rise. Casodex was stopped and he was switched to Zytiga  INTERVAL HISTORY: Peter [name] 80 y.o. male returns for further followup. He has been compliant with treatment recommendations. With the last visit, I started him on prednisone 5 mg twice a day. His daughter felt that the prednisone is improving his energy level and appetite. He denies any recent fever, chills, night sweats or abnormal weight loss    Denies recent bone pain  I have reviewed the past medical history, past surgical history, social history and family history with the patient and they are unchanged from previous note.  ALLERGIES:  has No Known Allergies.  MEDICATIONS:  Current Outpatient Prescriptions  Medication Sig Dispense Refill  . abiraterone Acetate (ZYTIGA) 250 MG tablet Take 500 mg by mouth daily. Take on an empty stomach 1 hour before or 2 hours after a meal      . CALCIUM CARBONATE-VIT D-MIN PO Take by mouth daily. Calcium 600 mg Vitamin D3 800 Units      . Coenzyme Q10 (CO Q 10) 100 MG CAPS Take by mouth. Takes 200 mg      . metFORMIN (GLUCOPHAGE-XR) 500 MG 24 hr tablet Take 1 tablet (500 mg total) by mouth daily with breakfast.  90 tablet  1  . Multiple Vitamin (MULTIVITAMIN) tablet Take 1 tablet by mouth daily.      . predniSONE (DELTASONE)  5 MG tablet Take 1 tablet (5 mg total) by mouth 2 (two) times daily with a meal.  60 tablet  2   No current facility-administered medications for this visit.    REVIEW OF SYSTEMS:   Constitutional: Denies fevers, chills or abnormal weight loss Eyes: Denies blurriness of vision Ears, nose, mouth, throat, and face: Denies mucositis or sore throat Respiratory: Denies cough, dyspnea or wheezes Cardiovascular: Denies palpitation, chest discomfort or lower extremity swelling Gastrointestinal:  Denies nausea, heartburn or change in bowel habits Skin: Denies abnormal skin rashes Lymphatics: Denies new lymphadenopathy or easy bruising Neurological:Denies numbness, tingling or new weaknesses Behavioral/Psych: Mood is stable, no new changes  All other systems were reviewed with the patient and are negative.  PHYSICAL EXAMINATION: ECOG PERFORMANCE STATUS: 1 - Symptomatic but completely ambulatory  Filed Vitals:   11/25/13 1450  BP: 96/64  Pulse: 78  Temp: 98.5 F (36.9 C)  Resp: 18   Filed Weights   11/25/13 1450  Weight: 195 lb 3.2 oz (88.542 kg)    GENERAL:alert, no distress and comfortable SKIN: skin color, texture, turgor are normal, no rashes or significant lesions EYES: normal, Conjunctiva are pink and non-injected, sclera clear OROPHARYNX:no exudate, no erythema and lips, buccal mucosa, and tongue normal . Poor dentition is noted NECK: supple, thyroid normal size, non-tender, without nodularity LYMPH:  no palpable lymphadenopathy in the cervical, axillary or inguinal LUNGS: clear to auscultation and percussion with normal breathing effort HEART: regular  rate & rhythm and no murmurs and no lower extremity edema ABDOMEN:abdomen soft, non-tender and normal bowel sounds Musculoskeletal:no cyanosis of digits and no clubbing  NEURO: alert & oriented x 3 with fluent speech, no focal motor/sensory deficits  LABORATORY DATA:  I have reviewed the data as listed    Component Value  Date/Time   NA 137 11/25/2013 1422   NA 136 03/24/2013 0546   K 4.7 11/25/2013 1422   K 4.3 03/24/2013 0546   CL 103 04/23/2013 1238   CL 101 03/24/2013 0546   CO2 24 11/25/2013 1422   CO2 29 03/24/2013 0546   GLUCOSE 124 11/25/2013 1422   GLUCOSE 186* 04/23/2013 1238   GLUCOSE 150* 03/24/2013 0546   BUN 18.9 11/25/2013 1422   BUN 10 03/24/2013 0546   CREATININE 1.0 11/25/2013 1422   CREATININE 0.76 03/24/2013 0546   CREATININE 0.99 02/12/2012 1546   CALCIUM 9.4 11/25/2013 1422   CALCIUM 8.4 03/24/2013 0546   PROT 7.0 11/25/2013 1422   PROT 7.6 03/21/2013 2025   ALBUMIN 3.6 11/25/2013 1422   ALBUMIN 3.3* 03/21/2013 2025   AST 26 11/25/2013 1422   AST 74* 03/21/2013 2025   ALT 12 11/25/2013 1422   ALT 19 03/21/2013 2025   ALKPHOS 406* 11/25/2013 1422   ALKPHOS 1659* 03/21/2013 2025   BILITOT 0.72 11/25/2013 1422   BILITOT 0.6 03/21/2013 2025   GFRNONAA 77* 03/24/2013 0546   GFRAA 90* 03/24/2013 0546    No results found for this basename: SPEP,  UPEP,   kappa and lambda light chains    Lab Results  Component Value Date   WBC 4.6 11/25/2013   NEUTROABS 3.4 11/25/2013   HGB 9.8* 11/25/2013   HCT 28.5* 11/25/2013   MCV 92.2 11/25/2013   PLT 111* 11/25/2013      Chemistry      Component Value Date/Time   NA 137 11/25/2013 1422   NA 136 03/24/2013 0546   K 4.7 11/25/2013 1422   K 4.3 03/24/2013 0546   CL 103 04/23/2013 1238   CL 101 03/24/2013 0546   CO2 24 11/25/2013 1422   CO2 29 03/24/2013 0546   BUN 18.9 11/25/2013 1422   BUN 10 03/24/2013 0546   CREATININE 1.0 11/25/2013 1422   CREATININE 0.76 03/24/2013 0546   CREATININE 0.99 02/12/2012 1546      Component Value Date/Time   CALCIUM 9.4 11/25/2013 1422   CALCIUM 8.4 03/24/2013 0546   ALKPHOS 406* 11/25/2013 1422   ALKPHOS 1659* 03/21/2013 2025   AST 26 11/25/2013 1422   AST 74* 03/21/2013 2025   ALT 12 11/25/2013 1422   ALT 19 03/21/2013 2025   BILITOT 0.72 11/25/2013 1422   BILITOT 0.6 03/21/2013 2025      ASSESSMENT & PLAN:  #1 metastatic prostate  cancer to the lymph nodes and bone We started him on complete androgen deprivation by adding Casodex from our previous visit. PSA is still rising and so we decided to switch treatment The patient have widespread bony metastasis. Ideally he should be started on IV bisphosphonates. However he has very poor dentition. The patient declined visiting the dentist. For that reason he's not on IV bisphosphonates. In the meantime, we will continue with monthly Firmagon. The patient had major side effects after starting on Zytiga He is improving since his treatment was placed on hold. I'm adding low-dose prednisone twice a day and when I restarted Zytiga at 50% dose adjustment he is doing well. I will see him back  in 4 weeks for further assessment. #2 anemia This is likely anemia of chronic disease. The patient denies recent history of bleeding such as epistaxis, hematuria or hematochezia. He is asymptomatic from the anemia. We will observe for now.  He does not require transfusion now.  #3 Thrombocytopenia The is because of bone marrow replacement by his cancer. He is not symptomatic. We will observe for now #4 poor appetite and fatigue This is improved with prednisone. Have increased prednisone slightly to 10 mg in the morning and 5 mg in the afternoon Orders Placed This Encounter  Procedures  . CBC with Differential    Standing Status: Future     Number of Occurrences:      Standing Expiration Date: 11/25/2014  . Comprehensive metabolic panel    Standing Status: Future     Number of Occurrences:      Standing Expiration Date: 11/25/2014  . PSA    Standing Status: Future     Number of Occurrences:      Standing Expiration Date: 11/25/2014   All questions were answered. The patient knows to call the clinic with any problems, questions or concerns. No barriers to learning was detected. I spent 25 minutes counseling the patient face to face. The total time spent in the appointment was 40 minutes and  more than 50% was on counseling and review of test results     [facility], [place], MD 11/25/2013 8:01 PM

## 2013-11-25 NOTE — Telephone Encounter (Signed)
gv adn printed appt sch3ed and avs for pt for Feb..Marland Kitchenpt wanted late afternoon appt

## 2013-11-25 NOTE — Patient Instructions (Signed)
Degarelix injection What is this medicine? DEGARELIX (deg a REL ix) is used to treat men with advanced prostate cancer. This medicine may be used for other purposes; ask your health care provider or pharmacist if you have questions. COMMON BRAND NAME(S): Degarelix, Mills Koller What should I tell my health care provider before I take this medicine? They need to know if you have any of these conditions: -heart disease -kidney disease -liver disease -low levels of potassium or magnesium in the blood -osteoporosis -an unusual or allergic reaction to degarelix, mannitol, other medicines, foods, dyes, or preservatives -pregnant or trying to get pregnant -breast-feeding How should I use this medicine? This medicine is for injection under the skin. It is usually given by a health care professional in a hospital or clinic setting. If you get this medicine at home, you will be taught how to prepare and give this medicine. Use exactly as directed. Take your medicine at regular intervals. Do not take it more often than directed. It is important that you put your used needles and syringes in a special sharps container. Do not put them in a trash can. If you do not have a sharps container, call your pharmacist or healthcare provider to get one. Talk to your pediatrician regarding the use of this medicine in children. Special care may be needed. Overdosage: If you think you've taken too much of this medicine contact a poison control center or emergency room at once. Overdosage: If you think you have taken too much of this medicine contact a poison control center or emergency room at once. NOTE: This medicine is only for you. Do not share this medicine with others. What if I miss a dose? Try not to miss a dose. If you do miss a dose, call your doctor or health care professional for advice. What may interact with this medicine? Do not take this medicine with any of the following  medications: -amiodarone -bretylium -disopyramide -dofetilide -droperidol -ibutilide -procainamide -quinidine -sotalol This list may not describe all possible interactions. Give your health care provider a list of all the medicines, herbs, non-prescription drugs, or dietary supplements you use. Also tell them if you smoke, drink alcohol, or use illegal drugs. Some items may interact with your medicine. What should I watch for while using this medicine? Visit your doctor or health care professional for regular checks on your progress and discuss any issues before you start taking this medicine. Your doctor or health care professional will need to monitor your hormone levels in your blood to check your response to treatment. Try to keep any appointments for testing. What side effects may I notice from receiving this medicine? Side effects that you should report to your doctor or health care professional as soon as possible: -allergic reactions like skin rash, itching or hives, swelling of the face, lips, or tongue -fever or chills -irregular heartbeat -nausea and vomiting along with severe abdominal pain -pain or difficulty passing urine -pelvic pain or bloating Side effects that usually do not require medical attention (Report these to your doctor or health care professional if they continue or are bothersome.): -change in sex drive or performance -constipation -headache -high blood pressure - hot flashes (flushing of skin, increased sweating) - itching, redness or mild pain at site where injected -joint pain -trouble sleeping -unusually weak or tired -weight gain This list may not describe all possible side effects. Call your doctor for medical advice about side effects. You may report side effects to FDA at 1-800-FDA-1088.  Where should I keep my medicine? Keep out of the reach of children. This drug is usually given in a hospital or clinic and will not be stored at home. In rare  cases, this medicine may be given at home. If you are using this medicine at home, you will be instructed on how to store this medicine. Throw away any unused medicine after the expiration date on the label. NOTE: This sheet is a summary. It may not cover all possible information. If you have questions about this medicine, talk to your doctor, pharmacist, or health care provider.  2014, Elsevier/Gold Standard. (2008-03-16 16:41:07)

## 2013-11-26 LAB — PSA: PSA: 1580 ng/mL — AB (ref <=4.00)

## 2013-11-27 ENCOUNTER — Telehealth: Payer: Self-pay | Admitting: *Deleted

## 2013-11-27 NOTE — Telephone Encounter (Signed)
S/w pt's daughter, Fraser Din, and informed of PSA level and Dr. Calton Dach note to continue Zytiga as directed.  Explained may need more time to work.  Informed will re check PSA on next visit 12/25/13.  Pat verbalized understanding.

## 2013-11-27 NOTE — Telephone Encounter (Signed)
Message copied by Cathlean Cower on Fri Nov 27, 2013  9:57 AM ------      Message from: [place], [place]: Thu Nov 26, 2013 10:34 PM      Regarding: test result       Please let patient know the results      Please tell him to be patient that even though result is slightly worse he just started on Zytiga      Recommend to continue rx for now and give the Rx a chance to work       ----- Message -----         From: [facility]         Sent: 11/25/2013   2:39 PM           To: Heath Lark, MD                   ------

## 2013-12-09 ENCOUNTER — Encounter: Payer: Medicare Other | Admitting: Internal Medicine

## 2013-12-10 ENCOUNTER — Encounter: Payer: Self-pay | Admitting: General Surgery

## 2013-12-15 ENCOUNTER — Telehealth: Payer: Self-pay | Admitting: *Deleted

## 2013-12-15 DIAGNOSIS — C61 Malignant neoplasm of prostate: Secondary | ICD-10-CM

## 2013-12-15 MED ORDER — ABIRATERONE ACETATE 250 MG PO TABS: 500.0000 mg | ORAL_TABLET | Freq: Every day | ORAL | Status: DC

## 2013-12-15 NOTE — Telephone Encounter (Signed)
One months rx with one additional refill on Zytiga sent to [facility].  Informed Daughter that Dr. Alvy Bimler recommends pt taking Zytiga for at least 3 months before making a decision on whether it is working or not.  Informed of refill sent to pharmacy and keep appt as scheduled on 2/20.  She verbalized understanding.

## 2013-12-15 NOTE — Telephone Encounter (Signed)
Dau left VM states pt has 2 Zytiga pills left, and needs refill.  He is taking two 250 mg tablets daily (500 mg total).  Dau request Dr. Alvy Bimler only refill enough to get to his next appt w/ her on 2/20.  This is #18 tablets.   States she wants to wait until results of next PSA to order more pills in case Fabio Asa is not working as it is expensive.

## 2013-12-15 NOTE — Telephone Encounter (Signed)
Too soon to tell with only 2 months worth of Rx Most urologist won't even see their patients or recheck PSA until after 3 months

## 2013-12-25 ENCOUNTER — Telehealth: Payer: Self-pay | Admitting: Hematology and Oncology

## 2013-12-25 ENCOUNTER — Ambulatory Visit: Payer: Medicare Other

## 2013-12-25 ENCOUNTER — Ambulatory Visit: Payer: Medicare Other | Admitting: Hematology and Oncology

## 2013-12-25 ENCOUNTER — Other Ambulatory Visit: Payer: Medicare Other

## 2013-12-25 NOTE — Telephone Encounter (Signed)
Talked to pt's daughter and r/s appt to 12/31/13 lab,md and injection

## 2013-12-30 ENCOUNTER — Encounter: Payer: Medicare Other | Admitting: Internal Medicine

## 2013-12-31 ENCOUNTER — Other Ambulatory Visit: Payer: Medicare Other

## 2013-12-31 ENCOUNTER — Ambulatory Visit: Payer: Medicare Other | Admitting: Hematology and Oncology

## 2013-12-31 ENCOUNTER — Ambulatory Visit: Payer: Medicare Other

## 2014-01-01 ENCOUNTER — Other Ambulatory Visit: Payer: Self-pay | Admitting: *Deleted

## 2014-01-12 ENCOUNTER — Telehealth: Payer: Self-pay | Admitting: Hematology and Oncology

## 2014-01-12 ENCOUNTER — Telehealth: Payer: Self-pay | Admitting: *Deleted

## 2014-01-12 NOTE — Telephone Encounter (Signed)
Dau left VM states pt missed last two appts due to bad weather.  He only has one week of Zytiga left and will need appt soon to get refill.   POF sent to get pt r/s to within one week and please call daughter w/ appt date/time

## 2014-01-12 NOTE — Telephone Encounter (Signed)
lvm for pt regarding to 3.13.15 appt.Marland KitchenMarland KitchenMarland Kitchen

## 2014-01-12 NOTE — Telephone Encounter (Signed)
Talked to pt's daughter today gave her appt for 3/13 lab,md and injections

## 2014-01-15 ENCOUNTER — Ambulatory Visit (HOSPITAL_BASED_OUTPATIENT_CLINIC_OR_DEPARTMENT_OTHER): Payer: Medicare Other

## 2014-01-15 ENCOUNTER — Encounter: Payer: Self-pay | Admitting: Hematology and Oncology

## 2014-01-15 ENCOUNTER — Ambulatory Visit (HOSPITAL_BASED_OUTPATIENT_CLINIC_OR_DEPARTMENT_OTHER): Payer: Medicare Other | Admitting: Hematology and Oncology

## 2014-01-15 ENCOUNTER — Telehealth: Payer: Self-pay | Admitting: Hematology and Oncology

## 2014-01-15 ENCOUNTER — Other Ambulatory Visit (HOSPITAL_BASED_OUTPATIENT_CLINIC_OR_DEPARTMENT_OTHER): Payer: Medicare Other

## 2014-01-15 VITALS — BP 123/95 | HR 97 | Temp 99.0°F | Resp 19 | Ht 65.0 in | Wt 209.7 lb

## 2014-01-15 DIAGNOSIS — C7952 Secondary malignant neoplasm of bone marrow: Secondary | ICD-10-CM

## 2014-01-15 DIAGNOSIS — C61 Malignant neoplasm of prostate: Secondary | ICD-10-CM

## 2014-01-15 DIAGNOSIS — C7951 Secondary malignant neoplasm of bone: Secondary | ICD-10-CM

## 2014-01-15 DIAGNOSIS — R609 Edema, unspecified: Secondary | ICD-10-CM

## 2014-01-15 DIAGNOSIS — C779 Secondary and unspecified malignant neoplasm of lymph node, unspecified: Secondary | ICD-10-CM

## 2014-01-15 DIAGNOSIS — Z23 Encounter for immunization: Secondary | ICD-10-CM

## 2014-01-15 DIAGNOSIS — R600 Localized edema: Secondary | ICD-10-CM

## 2014-01-15 DIAGNOSIS — D638 Anemia in other chronic diseases classified elsewhere: Secondary | ICD-10-CM

## 2014-01-15 HISTORY — DX: Encounter for immunization: Z23

## 2014-01-15 HISTORY — DX: Localized edema: R60.0

## 2014-01-15 LAB — CBC WITH DIFFERENTIAL/PLATELET
BASO%: 0.3 % (ref 0.0–2.0)
Basophils Absolute: 0 10*3/uL (ref 0.0–0.1)
EOS%: 3.6 % (ref 0.0–7.0)
Eosinophils Absolute: 0.1 10*3/uL (ref 0.0–0.5)
HCT: 27.4 % — ABNORMAL LOW (ref 38.4–49.9)
HGB: 9.2 g/dL — ABNORMAL LOW (ref 13.0–17.1)
LYMPH%: 19.7 % (ref 14.0–49.0)
MCH: 31.8 pg (ref 27.2–33.4)
MCHC: 33.6 g/dL (ref 32.0–36.0)
MCV: 94.8 fL (ref 79.3–98.0)
MONO#: 0.4 10*3/uL (ref 0.1–0.9)
MONO%: 9.6 % (ref 0.0–14.0)
NEUT#: 2.6 10*3/uL (ref 1.5–6.5)
NEUT%: 66.8 % (ref 39.0–75.0)
PLATELETS: 136 10*3/uL — AB (ref 140–400)
RBC: 2.89 10*6/uL — ABNORMAL LOW (ref 4.20–5.82)
RDW: 14.8 % — ABNORMAL HIGH (ref 11.0–14.6)
WBC: 3.9 10*3/uL — AB (ref 4.0–10.3)
lymph#: 0.8 10*3/uL — ABNORMAL LOW (ref 0.9–3.3)

## 2014-01-15 LAB — COMPREHENSIVE METABOLIC PANEL (CC13)
ALT: 12 U/L (ref 0–55)
ANION GAP: 10 meq/L (ref 3–11)
AST: 24 U/L (ref 5–34)
Albumin: 3.2 g/dL — ABNORMAL LOW (ref 3.5–5.0)
Alkaline Phosphatase: 324 U/L — ABNORMAL HIGH (ref 40–150)
BILIRUBIN TOTAL: 0.77 mg/dL (ref 0.20–1.20)
BUN: 16.7 mg/dL (ref 7.0–26.0)
CO2: 24 mEq/L (ref 22–29)
CREATININE: 0.9 mg/dL (ref 0.7–1.3)
Calcium: 9.4 mg/dL (ref 8.4–10.4)
Chloride: 106 mEq/L (ref 98–109)
GLUCOSE: 116 mg/dL (ref 70–140)
Potassium: 4.1 mEq/L (ref 3.5–5.1)
Sodium: 140 mEq/L (ref 136–145)
Total Protein: 6.9 g/dL (ref 6.4–8.3)

## 2014-01-15 MED ORDER — PNEUMOCOCCAL VAC POLYVALENT 25 MCG/0.5ML IJ INJ
0.5000 mL | INJECTION | Freq: Once | INTRAMUSCULAR | Status: AC
  Administered 2014-01-15: 0.5 mL via INTRAMUSCULAR
  Filled 2014-01-15: qty 0.5

## 2014-01-15 MED ORDER — FUROSEMIDE 20 MG PO TABS: 20.0000 mg | ORAL_TABLET | Freq: Every day | ORAL | Status: DC

## 2014-01-15 MED ORDER — DEGARELIX ACETATE 80 MG ~~LOC~~ SOLR
80.0000 mg | SUBCUTANEOUS | Status: DC
  Administered 2014-01-15: 80 mg via SUBCUTANEOUS
  Filled 2014-01-15: qty 4

## 2014-01-15 NOTE — Telephone Encounter (Signed)
gv adn pritned appt sched and avs for pt for April °

## 2014-01-15 NOTE — Progress Notes (Signed)
[place] OFFICE PROGRESS NOTE  Patient Care Team: Lupita [name], MD as PCP - General (Family Medicine) Heath Lark, MD as Consulting Physician (Hematology and Oncology)  DIAGNOSIS: Metastatic prostate cancer  SUMMARY OF ONCOLOGIC HISTORY: This is a pleasant 80 year old gentleman presented in the hospital with weakness and subsequently was found to have widespread metastatic cancer with a PSA were 5000. He never had a biopsy performed due to his age He was started on hormonal manipulation in May of 2014 and currently on a maintain his treatment every month. In October 2014, PSA started to rise. Casodex was added. In December 2014, PSA continue to rise. Casodex was stopped and he was switched to [facility] In January 2015, I added prednisone to help with energy and appetite.  INTERVAL HISTORY: Peter [name] 80 y.o. male returns for further followup. He rescheduled his appointment multiple times due to bad whether. He was on prednisone 5 mg twice a day. Since then, he gained 15 pounds of fluid weight. However his appetite has improved. He denies any bone pain. Denies any cough. Denies any urinary difficulties.  I have reviewed the past medical history, past surgical history, social history and family history with the patient and they are unchanged from previous note.  ALLERGIES:  has No Known Allergies.  MEDICATIONS:  Current Outpatient Prescriptions  Medication Sig Dispense Refill  . abiraterone Acetate (ZYTIGA) 250 MG tablet Take 2 tablets (500 mg total) by mouth daily. Take on an empty stomach 1 hour before or 2 hours after a meal  60 tablet  1  . CALCIUM CARBONATE-VIT D-MIN PO (Calcium 600 mg Vitamin D3 800 Units) Take one tablet once a day      . Coenzyme Q10 (CO Q 10) 100 MG CAPS Take 200 mg by mouth daily.       . metFORMIN (GLUCOPHAGE-XR) 500 MG 24 hr tablet Take 1 tablet (500 mg total) by mouth daily with breakfast.  90 tablet  1  . Multiple Vitamin  (MULTIVITAMIN) tablet Take 1 tablet by mouth daily.      . predniSONE (DELTASONE) 5 MG tablet Take 1 tablet (5 mg total) by mouth 2 (two) times daily with a meal.  60 tablet  2  . furosemide (LASIX) 20 MG tablet Take 1 tablet (20 mg total) by mouth daily.  30 tablet  1   No current facility-administered medications for this visit.   Facility-Administered Medications Ordered in Other Visits  Medication Dose Route Frequency Provider Last Rate Last Dose  . degarelix (FIRMAGON) injection 80 mg  80 mg Subcutaneous Q28 days Heath Lark, MD        REVIEW OF SYSTEMS:   Constitutional: Denies fevers, chills  Eyes: Denies blurriness of vision Ears, nose, mouth, throat, and face: Denies mucositis or sore throat Respiratory: Denies cough, dyspnea or wheezes Cardiovascular: Denies palpitation, chest discomfort  Gastrointestinal:  Denies nausea, heartburn or change in bowel habits Skin: Denies abnormal skin rashes Lymphatics: Denies new lymphadenopathy or easy bruising Neurological:Denies numbness, tingling or new weaknesses Behavioral/Psych: Mood is stable, no new changes  All other systems were reviewed with the patient and are negative.  PHYSICAL EXAMINATION: ECOG PERFORMANCE STATUS: 1 - Symptomatic but completely ambulatory  Filed Vitals:   01/15/14 1506  BP: 123/95  Pulse: 97  Temp: 99 F (37.2 C)  Resp: 19   Filed Weights   01/15/14 1506  Weight: 209 lb 11.2 oz (95.119 kg)    GENERAL:alert, no distress and comfortable. He is  morbidly obese SKIN: skin color, texture, turgor are normal, no rashes or significant lesions EYES: normal, Conjunctiva are pink and non-injected, sclera clear OROPHARYNX:no exudate, no erythema and lips, buccal mucosa, and tongue normal . Very poor dentition is noted. NECK: supple, thyroid normal size, non-tender, without nodularity LYMPH:  no palpable lymphadenopathy in the cervical, axillary or inguinal LUNGS: clear to auscultation and percussion with  normal breathing effort HEART: regular rate & rhythm and no murmurs with moderate bilateral lower extremity edema ABDOMEN:abdomen soft, non-tender and normal bowel sounds Musculoskeletal:no cyanosis of digits and no clubbing  NEURO: alert & oriented x 3 with fluent speech, no focal motor/sensory deficits  LABORATORY DATA:  I have reviewed the data as listed    Component Value Date/Time   NA 140 01/15/2014 1428   NA 136 03/24/2013 0546   K 4.1 01/15/2014 1428   K 4.3 03/24/2013 0546   CL 103 04/23/2013 1238   CL 101 03/24/2013 0546   CO2 24 01/15/2014 1428   CO2 29 03/24/2013 0546   GLUCOSE 116 01/15/2014 1428   GLUCOSE 186* 04/23/2013 1238   GLUCOSE 150* 03/24/2013 0546   BUN 16.7 01/15/2014 1428   BUN 10 03/24/2013 0546   CREATININE 0.9 01/15/2014 1428   CREATININE 0.76 03/24/2013 0546   CREATININE 0.99 02/12/2012 1546   CALCIUM 9.4 01/15/2014 1428   CALCIUM 8.4 03/24/2013 0546   PROT 6.9 01/15/2014 1428   PROT 7.6 03/21/2013 2025   ALBUMIN 3.2* 01/15/2014 1428   ALBUMIN 3.3* 03/21/2013 2025   AST 24 01/15/2014 1428   AST 74* 03/21/2013 2025   ALT 12 01/15/2014 1428   ALT 19 03/21/2013 2025   ALKPHOS 324* 01/15/2014 1428   ALKPHOS 1659* 03/21/2013 2025   BILITOT 0.77 01/15/2014 1428   BILITOT 0.6 03/21/2013 2025   GFRNONAA 77* 03/24/2013 0546   GFRAA 90* 03/24/2013 0546    No results found for this basename: SPEP,  UPEP,   kappa and lambda light chains    Lab Results  Component Value Date   WBC 3.9* 01/15/2014   NEUTROABS 2.6 01/15/2014   HGB 9.2* 01/15/2014   HCT 27.4* 01/15/2014   MCV 94.8 01/15/2014   PLT 136* 01/15/2014      Chemistry      Component Value Date/Time   NA 140 01/15/2014 1428   NA 136 03/24/2013 0546   K 4.1 01/15/2014 1428   K 4.3 03/24/2013 0546   CL 103 04/23/2013 1238   CL 101 03/24/2013 0546   CO2 24 01/15/2014 1428   CO2 29 03/24/2013 0546   BUN 16.7 01/15/2014 1428   BUN 10 03/24/2013 0546   CREATININE 0.9 01/15/2014 1428   CREATININE 0.76 03/24/2013 0546   CREATININE  0.99 02/12/2012 1546      Component Value Date/Time   CALCIUM 9.4 01/15/2014 1428   CALCIUM 8.4 03/24/2013 0546   ALKPHOS 324* 01/15/2014 1428   ALKPHOS 1659* 03/21/2013 2025   AST 24 01/15/2014 1428   AST 74* 03/21/2013 2025   ALT 12 01/15/2014 1428   ALT 19 03/21/2013 2025   BILITOT 0.77 01/15/2014 1428   BILITOT 0.6 03/21/2013 2025      ASSESSMENT & PLAN:  #1 metastatic prostate cancer to the lymph nodes and bone The patient has failed therapy with complete androgen deprivation. The patient have widespread bony metastasis. Ideally he should be started on IV bisphosphonates. However he has very poor dentition. The patient declined visiting the dentist. For that reason he's not on  IV bisphosphonates. In the meantime, we will continue with monthly Firmagon. The patient had major side effects after starting on Zytiga He is improving since his treatment was placed on hold. He is doing well on low-dose prednisone twice a day and when I restarted Zytiga at 50% dose adjustment he is doing well.  His last PSA was high. PSA from today is pending. I will call the patient once I have test result. If his PSA continued to rise, we will discontinue Zytiga and consider chemotherapy. #2 anemia This is likely anemia of chronic disease. The patient denies recent history of bleeding such as epistaxis, hematuria or hematochezia. He is asymptomatic from the anemia. We will observe for now.  He does not require transfusion now.  #3 Mild leukopenia and mild thrombocytopenia The is because of bone marrow replacement by his cancer. He is not symptomatic. We will observe for now #4 poor appetite and fatigue This is improved with prednisone. I recommend reduce the prednisone to 5 mg a day. #5 fluid retention I will start him on Lasix daily. #6 arthritis pain I recommend vitamin D supplement.  Orders Placed This Encounter  Procedures  . Comprehensive metabolic panel    Standing Status: Future     Number of  Occurrences:      Standing Expiration Date: 01/15/2015  . CBC with Differential    Standing Status: Future     Number of Occurrences:      Standing Expiration Date: 01/15/2015  . PSA    Standing Status: Future     Number of Occurrences:      Standing Expiration Date: 01/15/2015   All questions were answered. The patient knows to call the clinic with any problems, questions or concerns. No barriers to learning was detected. I spent 40 minutes counseling the patient face to face. The total time spent in the appointment was 55 minutes and more than 50% was on counseling and review of test results     [facility], [place], MD 01/15/2014 4:04 PM

## 2014-01-16 LAB — PSA: PSA: 865 ng/mL — AB (ref <=4.00)

## 2014-01-18 ENCOUNTER — Other Ambulatory Visit: Payer: Self-pay | Admitting: Hematology and Oncology

## 2014-01-18 ENCOUNTER — Telehealth: Payer: Self-pay | Admitting: *Deleted

## 2014-01-18 DIAGNOSIS — C61 Malignant neoplasm of prostate: Secondary | ICD-10-CM

## 2014-01-18 MED ORDER — ABIRATERONE ACETATE 250 MG PO TABS: 500.0000 mg | ORAL_TABLET | Freq: Every day | ORAL | Status: DC

## 2014-01-18 NOTE — Telephone Encounter (Signed)
LVM for Mardene Celeste, pt's daughter, asking her to return nurse's call regarding lab results (PSA) and refill (zytiga faxed to [facility]).

## 2014-01-18 NOTE — Telephone Encounter (Signed)
Left VM for [name] to return nurse's call regarding lab results and refill.

## 2014-01-18 NOTE — Telephone Encounter (Signed)
Message copied by Cathlean Cower on Mon Jan 18, 2014 10:00 AM ------      Message from: [facility], [place]: Mon Jan 18, 2014  8:56 AM       Please call pt to review PSA      It had dropped by 1/2      I will go ahead and refill his Zytiga      ----- Message -----         From: [facility]         Sent: 01/15/2014   2:43 PM           To: Heath Lark, MD                   ------

## 2014-01-19 ENCOUNTER — Encounter: Payer: Self-pay | Admitting: *Deleted

## 2014-01-19 ENCOUNTER — Telehealth: Payer: Self-pay | Admitting: *Deleted

## 2014-01-19 NOTE — Telephone Encounter (Signed)
Dau returned nurse's call. Informed her of PSA level improved and pt to continue zytiga.  Refill sent to [facility].  States they picked up refill yesterday.

## 2014-01-21 ENCOUNTER — Emergency Department (HOSPITAL_COMMUNITY): Payer: Medicare Other

## 2014-01-21 ENCOUNTER — Emergency Department (HOSPITAL_COMMUNITY)
Admission: EM | Admit: 2014-01-21 | Discharge: 2014-01-21 | Disposition: A | Discharge status: Home / Self Care | Payer: Medicare Other | Attending: Emergency Medicine | Admitting: Emergency Medicine

## 2014-01-21 ENCOUNTER — Encounter (HOSPITAL_COMMUNITY): Payer: Self-pay | Admitting: Emergency Medicine

## 2014-01-21 DIAGNOSIS — C7952 Secondary malignant neoplasm of bone marrow: Secondary | ICD-10-CM

## 2014-01-21 DIAGNOSIS — IMO0002 Reserved for concepts with insufficient information to code with codable children: Secondary | ICD-10-CM | POA: Insufficient documentation

## 2014-01-21 DIAGNOSIS — Y9389 Activity, other specified: Secondary | ICD-10-CM | POA: Insufficient documentation

## 2014-01-21 DIAGNOSIS — S62639A Displaced fracture of distal phalanx of unspecified finger, initial encounter for closed fracture: Secondary | ICD-10-CM | POA: Insufficient documentation

## 2014-01-21 DIAGNOSIS — E669 Obesity, unspecified: Secondary | ICD-10-CM | POA: Insufficient documentation

## 2014-01-21 DIAGNOSIS — Z872 Personal history of diseases of the skin and subcutaneous tissue: Secondary | ICD-10-CM | POA: Insufficient documentation

## 2014-01-21 DIAGNOSIS — Z8719 Personal history of other diseases of the digestive system: Secondary | ICD-10-CM | POA: Insufficient documentation

## 2014-01-21 DIAGNOSIS — E119 Type 2 diabetes mellitus without complications: Secondary | ICD-10-CM | POA: Insufficient documentation

## 2014-01-21 DIAGNOSIS — C799 Secondary malignant neoplasm of unspecified site: Secondary | ICD-10-CM

## 2014-01-21 DIAGNOSIS — I4460 Unspecified fascicular block: Secondary | ICD-10-CM | POA: Insufficient documentation

## 2014-01-21 DIAGNOSIS — Z9981 Dependence on supplemental oxygen: Secondary | ICD-10-CM | POA: Insufficient documentation

## 2014-01-21 DIAGNOSIS — C61 Malignant neoplasm of prostate: Secondary | ICD-10-CM | POA: Insufficient documentation

## 2014-01-21 DIAGNOSIS — Z87891 Personal history of nicotine dependence: Secondary | ICD-10-CM | POA: Insufficient documentation

## 2014-01-21 DIAGNOSIS — W19XXXA Unspecified fall, initial encounter: Secondary | ICD-10-CM

## 2014-01-21 DIAGNOSIS — C7951 Secondary malignant neoplasm of bone: Secondary | ICD-10-CM | POA: Insufficient documentation

## 2014-01-21 DIAGNOSIS — S62509A Fracture of unspecified phalanx of unspecified thumb, initial encounter for closed fracture: Secondary | ICD-10-CM

## 2014-01-21 DIAGNOSIS — Z860100 Personal history of colon polyps, unspecified: Secondary | ICD-10-CM | POA: Insufficient documentation

## 2014-01-21 DIAGNOSIS — Z8774 Personal history of (corrected) congenital malformations of heart and circulatory system: Secondary | ICD-10-CM | POA: Insufficient documentation

## 2014-01-21 DIAGNOSIS — M479 Spondylosis, unspecified: Secondary | ICD-10-CM | POA: Insufficient documentation

## 2014-01-21 DIAGNOSIS — Z862 Personal history of diseases of the blood and blood-forming organs and certain disorders involving the immune mechanism: Secondary | ICD-10-CM | POA: Insufficient documentation

## 2014-01-21 DIAGNOSIS — G4733 Obstructive sleep apnea (adult) (pediatric): Secondary | ICD-10-CM | POA: Insufficient documentation

## 2014-01-21 DIAGNOSIS — I872 Venous insufficiency (chronic) (peripheral): Secondary | ICD-10-CM | POA: Insufficient documentation

## 2014-01-21 DIAGNOSIS — Y9289 Other specified places as the place of occurrence of the external cause: Secondary | ICD-10-CM | POA: Insufficient documentation

## 2014-01-21 DIAGNOSIS — S46919A Strain of unspecified muscle, fascia and tendon at shoulder and upper arm level, unspecified arm, initial encounter: Secondary | ICD-10-CM

## 2014-01-21 DIAGNOSIS — Z79899 Other long term (current) drug therapy: Secondary | ICD-10-CM | POA: Insufficient documentation

## 2014-01-21 DIAGNOSIS — I441 Atrioventricular block, second degree: Secondary | ICD-10-CM | POA: Insufficient documentation

## 2014-01-21 LAB — CBC WITH DIFFERENTIAL/PLATELET
Basophils Absolute: 0 10*3/uL (ref 0.0–0.1)
Basophils Relative: 1 % (ref 0–1)
Eosinophils Absolute: 0.2 10*3/uL (ref 0.0–0.7)
Eosinophils Relative: 4 % (ref 0–5)
HCT: 26.1 % — ABNORMAL LOW (ref 39.0–52.0)
HEMOGLOBIN: 9 g/dL — AB (ref 13.0–17.0)
LYMPHS ABS: 0.6 10*3/uL — AB (ref 0.7–4.0)
Lymphocytes Relative: 16 % (ref 12–46)
MCH: 32.4 pg (ref 26.0–34.0)
MCHC: 34.5 g/dL (ref 30.0–36.0)
MCV: 93.9 fL (ref 78.0–100.0)
MONOS PCT: 8 % (ref 3–12)
Monocytes Absolute: 0.3 10*3/uL (ref 0.1–1.0)
NEUTROS ABS: 2.7 10*3/uL (ref 1.7–7.7)
NEUTROS PCT: 72 % (ref 43–77)
Platelets: 152 10*3/uL (ref 150–400)
RBC: 2.78 MIL/uL — ABNORMAL LOW (ref 4.22–5.81)
RDW: 14.6 % (ref 11.5–15.5)
WBC: 3.8 10*3/uL — ABNORMAL LOW (ref 4.0–10.5)

## 2014-01-21 LAB — COMPREHENSIVE METABOLIC PANEL
ALK PHOS: 298 U/L — AB (ref 39–117)
ALT: 16 U/L (ref 0–53)
AST: 42 U/L — ABNORMAL HIGH (ref 0–37)
Albumin: 3.1 g/dL — ABNORMAL LOW (ref 3.5–5.2)
BUN: 11 mg/dL (ref 6–23)
CHLORIDE: 100 meq/L (ref 96–112)
CO2: 23 mEq/L (ref 19–32)
Calcium: 8.8 mg/dL (ref 8.4–10.5)
Creatinine, Ser: 0.82 mg/dL (ref 0.50–1.35)
GFR calc non Af Amer: 74 mL/min — ABNORMAL LOW (ref >90)
GFR, EST AFRICAN AMERICAN: 86 mL/min — AB (ref >90)
GLUCOSE: 139 mg/dL — AB (ref 70–99)
POTASSIUM: 3.6 meq/L — AB (ref 3.7–5.3)
Sodium: 138 mEq/L (ref 137–147)
Total Bilirubin: 1 mg/dL (ref 0.3–1.2)
Total Protein: 6.8 g/dL (ref 6.0–8.3)

## 2014-01-21 MED ORDER — TRAMADOL HCL 50 MG PO TABS: 50.0000 mg | ORAL_TABLET | Freq: Four times a day (QID) | ORAL | Status: DC | PRN

## 2014-01-21 MED ORDER — TRAMADOL HCL 50 MG PO TABS
50.0000 mg | ORAL_TABLET | Freq: Once | ORAL | Status: AC
  Administered 2014-01-21: 50 mg via ORAL
  Filled 2014-01-21: qty 1

## 2014-01-21 NOTE — ED Notes (Signed)
Pt was getting out of his car yesterday, lost his balance, and fell on R arm.  EMS wanted to bring him to hospital, but pt refused.  No loc.  Pt is being tx for prostate ca with mets to bones.

## 2014-01-21 NOTE — ED Notes (Signed)
Pt has bruising on the RUE with bruising around the thumb (unrelated to the fall per pt) and elbow from a fall sustained yesterday.  No bleeding present.  Pt has limited movement with the right arm.    Dr. Alvino Chapel at bedside.

## 2014-01-21 NOTE — Progress Notes (Signed)
Orthopedic Tech Progress Note Patient Details:  TILLMAN KAZMIERSKI April 29, 1919 [id number] Thumb spica splint applied to Right UE. Arm sling applied. Ortho Devices Type of Ortho Device: Ace wrap;Arm sling;Thumb spica splint Splint Material: Fiberglass Ortho Device/Splint Location: Right Ortho Device/Splint Interventions: Application   Asia R Thompson 01/21/2014, 12:55 PM

## 2014-01-21 NOTE — Discharge Instructions (Signed)
Followup with a hand surgeon for the thumb. Use the sling for comfort.  Finger Fracture Fractures of fingers are breaks in the bones of the fingers. There are many types of fractures. There are different ways of treating these fractures. Your health care provider will discuss the best way to treat your fracture. CAUSES Traumatic injury is the main cause of broken fingers. These include:  Injuries while playing sports.  Workplace injuries.  Falls. RISK FACTORS Activities that can increase your risk of finger fractures include:  Sports.  Workplace activities that involve machinery.  A condition called osteoporosis, which can make your bones less dense and cause them to fracture more easily. SIGNS AND SYMPTOMS The main symptoms of a broken finger are pain and swelling within 15 minutes after the injury. Other symptoms include:  Bruising of your finger.  Stiffness of your finger.  Numbness of your finger.  Exposed bones (compound fracture) if the fracture is severe. DIAGNOSIS  The best way to diagnose a broken bone is with X-ray imaging. Additionally, your health care provider will use this X-ray image to evaluate the position of the broken finger bones.  TREATMENT  Finger fractures can be treated with:   Nonreduction This means the bones are in place. The finger is splinted without changing the positions of the bone pieces. The splint is usually left on for about a week to 10 days. This will depend on your fracture and what your health care provider thinks.  Closed reduction The bones are put back into position without using surgery. The finger is then splinted.  Open reduction and internal fixation The fracture site is opened. Then the bone pieces are fixed into place with pins or some type of hardware. This is seldom required. It depends on the severity of the fracture. HOME CARE INSTRUCTIONS   Follow your health care provider's instructions regarding activities, exercises,  and physical therapy.  Only take over-the-counter or prescription medicines for pain, discomfort, or fever as directed by your health care provider. SEEK MEDICAL CARE IF: You have pain or swelling that limits the motion or use of your fingers. SEEK IMMEDIATE MEDICAL CARE IF:  Your finger becomes numb. MAKE SURE YOU:   Understand these instructions.  Will watch your condition.  Will get help right away if you are not doing well or get worse. Document Released: 02/03/2001 Document Revised: 08/12/2013 Document Reviewed: 06/03/2013 [facility] Patient Information 2014 [place], [place].

## 2014-01-21 NOTE — ED Provider Notes (Signed)
CSN: [id number]     Arrival date & time 01/21/14  [id number] History   First MD Initiated Contact with Patient 01/21/14 1052     Chief Complaint  Patient presents with  . Arm Pain    fall     (Consider location/radiation/quality/duration/timing/severity/associated sxs/prior Treatment) Patient is a 80 y.o. male presenting with arm pain. The history is provided by the patient.  Arm Pain This is a new problem. Pertinent negatives include no chest pain and no shortness of breath.   patient had a mechanical fall yesterday. He tripped while getting out of a car. No loss consciousness. He has pain in his right shoulder now. He has a history of metastatic prostate cancer. It is metastatic to multiple different bones. No headache. No neck pain. No numbness weakness, however he does have difficulty moving the arm due to the pain. He also has some swelling and pain in his right thumb. He states he had a fall 6 weeks ago and has been somewhat painful since. He is not on pain medicine or anticoagulation.   Past Medical History  Diagnosis Date  . Cardiac septal defect     closed self during childhood  . Blood transfusion     colon polypectomy, bleeding requiring transfusion  . Hx of colonic polyps 03/03/2005    adenomatous  . Obstructive sleep apnea     Does not use CPAP  . Anemia   . Diabetes mellitus   . Diverticulosis of colon   . GERD without esophagitis   . Hyperlipidemia   . Macular degeneration   . Memory loss of   . Osteoarthritis of spine   . Periumbilical hernia   . Psoriasis   . Chronic venous insufficiency   . AV block, complete   . Mobitz (type) II atrioventricular block   . Obesity, unspecified   . Left bundle branch hemiblock   . Pure hypercholesterolemia   . Cardiac pacemaker in situ 01/18/05    1st one inserted 01/18/05. 2nd one inserted 06/06/10.  Marland Kitchen LBBB (left bundle branch block)   . Lower extremity edema     Chronic   . Fluid retention in legs 01/15/2014  . Need for  pneumococcal vaccination 01/15/2014  . Prostate cancer     Urologist watchfully waiting   Past Surgical History  Procedure Laterality Date  . Tonsillectomy      Age 62  . Partial colectomy  07/09/02    For Polyps  . Pacemaker placement  01/18/05  . 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 Cataract extraction  6/12    left eye Dr Katy Fitch  . Pacemaker replacement  06/06/10    Dr Leonia Reeves   Family History  Problem Relation Age of Onset  . Dementia Mother     Died at 80  . CAD Mother   . Cancer Father   . Heart murmur Father   . Cancer Paternal Grandmother   . Kidney failure Maternal Uncle   . Alzheimer's disease Daughter   . COPD Daughter   . Cancer Brother    History  Substance Use Topics  . Smoking status: Former Smoker    Types: Pipe  . Smokeless tobacco: Former Systems developer  . Alcohol Use: Yes     Comment: 1 Beer Daily    Review of Systems  Constitutional: Negative for appetite change.  Respiratory: Negative for chest tightness and shortness of breath.   Cardiovascular: Negative for chest pain.  Musculoskeletal: Positive for joint swelling.  Skin: Positive for wound.  Hematological: Negative for adenopathy. Bruises/bleeds easily.      Allergies  Review of patient's allergies indicates no known allergies.  Home Medications   Current Outpatient Rx  Name  Route  Sig  Dispense  Refill  . abiraterone Acetate (ZYTIGA) 250 MG tablet   Oral   Take 2 tablets (500 mg total) by mouth daily. Take on an empty stomach 1 hour before or 2 hours after a meal   60 tablet   1   . CALCIUM CARBONATE-VIT D-MIN PO      (Calcium 600 mg Vitamin D3 800 Units) Take one tablet once a day         . Coenzyme Q10 (CO Q 10) 100 MG CAPS   Oral   Take 200 mg by mouth daily.          . feeding supplement (BOOST HIGH PROTEIN) LIQD   Oral   Take 1 Container by mouth every morning.         . furosemide (LASIX) 20 MG tablet   Oral   Take 1 tablet (20 mg total) by mouth daily.    30 tablet   1   . metFORMIN (GLUCOPHAGE-XR) 500 MG 24 hr tablet   Oral   Take 1 tablet (500 mg total) by mouth daily with breakfast.   90 tablet   1   . Multiple Vitamin (MULTIVITAMIN) tablet   Oral   Take 1 tablet by mouth daily.         . predniSONE (DELTASONE) 5 MG tablet   Oral   Take 1 tablet (5 mg total) by mouth 2 (two) times daily with a meal.   60 tablet   2   . Vitamin D, Cholecalciferol, 1000 UNITS TABS   Oral   Take 1,000 Units by mouth daily.         . traMADol (ULTRAM) 50 MG tablet   Oral   Take 1 tablet (50 mg total) by mouth every 6 (six) hours as needed.   15 tablet   0    BP 110/43  Pulse 91  Temp(Src) 98.5 F (36.9 C) (Oral)  Resp 18  Ht 5\' 6"  (1.676 m)  Wt 208 lb (94.348 kg)  BMI 33.59 kg/m2  SpO2 96% Physical Exam  Constitutional: He appears well-developed and well-nourished.  Eyes: EOM are normal. Pupils are equal, round, and reactive to light.  Cardiovascular: Normal rate and regular rhythm.   Pulmonary/Chest: Effort normal.  Abdominal: Soft. There is no tenderness.  Musculoskeletal: He exhibits tenderness.  No extremity over left upper or either lower extremity. There is tenderness anterior to the right shoulder. He has rather good passive range of motion, however has difficulty with active range of motion due to pain. His neurovascular intact over the right hand with a strong radial pulse. He does have some ecchymosis over the right first MCP joint. There is also some deformity of the distal phalanx of the right thumb. Skin is intact    ED Course  Procedures (including critical care time) Labs Review Labs Reviewed  CBC WITH DIFFERENTIAL - Abnormal; Notable for the following:    WBC 3.8 (*)    RBC 2.78 (*)    Hemoglobin 9.0 (*)    HCT 26.1 (*)    Lymphs Abs 0.6 (*)    All other components within normal limits  COMPREHENSIVE METABOLIC PANEL - Abnormal; Notable for the following:    Potassium 3.6 (*)    Glucose,  Bld 139 (*)     Albumin 3.1 (*)    AST 42 (*)    Alkaline Phosphatase 298 (*)    GFR calc non Af Amer 74 (*)    GFR calc Af Amer 86 (*)    All other components within normal limits   Imaging Review Dg Shoulder Right  01/21/2014   CLINICAL DATA:  Status post fall now with right shoulder pain  EXAM: RIGHT SHOULDER - 2+ VIEW  COMPARISON:  NM BONE WHOLE BODY dated 03/24/2013  FINDINGS: There is heterogeneous mineralization of all visualized bony structures with areas of lucency and sclerosis consistent with widespread bony metastatic disease. This is consistent with the bone scan findings in May of 2014. There is no evidence of an acute fracture nor dislocation. No is soft tissue abnormalities demonstrated. The observed portions of the upper ribs and of the left clavicle exhibit no fractures.  IMPRESSION: There is no acute fracture or dislocation. Widespread bony metastatic disease is present.   Electronically Signed   By: David  Martinique   On: 01/21/2014 10:51   Dg Finger Thumb Right  01/21/2014   CLINICAL DATA:  Previous injury with pain and swelling  EXAM: RIGHT THUMB 2+V  COMPARISON:  None.  FINDINGS: There is a transverse fracture through the base of the first distal phalanx. Mild impaction and angulation at the fracture site is noted. Generalized soft tissue swelling is seen.  IMPRESSION: First distal phalangeal fracture.   Electronically Signed   By: Inez Catalina M.D.   On: 01/21/2014 12:05     EKG Interpretation None      MDM   Final diagnoses:  Fall  Thumb fracture  Shoulder strain  Metastatic cancer    Patient with fall. Likely shoulder strain or pain from his metastatic cancer. No fracture seen on x-ray of the shoulder. He does have a fracture of the distal phalanx of the right thumb. A fall on this was approximately 6 weeks ago. He was splinted and will followup with hand surgery. Was given pain medicin.    Jasper Riling. Alvino Chapel, MD 01/21/14 1424

## 2014-01-21 NOTE — ED Notes (Signed)
Ortho CALL D FOR THUMB SPLINT PLACEMENT

## 2014-01-30 ENCOUNTER — Encounter (HOSPITAL_COMMUNITY): Payer: Self-pay | Admitting: Emergency Medicine

## 2014-01-30 ENCOUNTER — Emergency Department (HOSPITAL_COMMUNITY): Payer: Medicare Other

## 2014-01-30 ENCOUNTER — Emergency Department (HOSPITAL_COMMUNITY)
Admission: EM | Admit: 2014-01-30 | Discharge: 2014-01-30 | Disposition: A | Discharge status: Home / Self Care | Payer: Medicare Other | Attending: Emergency Medicine | Admitting: Emergency Medicine

## 2014-01-30 DIAGNOSIS — Y9389 Activity, other specified: Secondary | ICD-10-CM | POA: Insufficient documentation

## 2014-01-30 DIAGNOSIS — Z79899 Other long term (current) drug therapy: Secondary | ICD-10-CM | POA: Insufficient documentation

## 2014-01-30 DIAGNOSIS — Z91199 Patient's noncompliance with other medical treatment and regimen due to unspecified reason: Secondary | ICD-10-CM | POA: Insufficient documentation

## 2014-01-30 DIAGNOSIS — K219 Gastro-esophageal reflux disease without esophagitis: Secondary | ICD-10-CM | POA: Insufficient documentation

## 2014-01-30 DIAGNOSIS — R011 Cardiac murmur, unspecified: Secondary | ICD-10-CM | POA: Insufficient documentation

## 2014-01-30 DIAGNOSIS — R296 Repeated falls: Secondary | ICD-10-CM | POA: Insufficient documentation

## 2014-01-30 DIAGNOSIS — Z860100 Personal history of colon polyps, unspecified: Secondary | ICD-10-CM | POA: Insufficient documentation

## 2014-01-30 DIAGNOSIS — R5383 Other fatigue: Principal | ICD-10-CM

## 2014-01-30 DIAGNOSIS — E119 Type 2 diabetes mellitus without complications: Secondary | ICD-10-CM | POA: Insufficient documentation

## 2014-01-30 DIAGNOSIS — C61 Malignant neoplasm of prostate: Secondary | ICD-10-CM | POA: Insufficient documentation

## 2014-01-30 DIAGNOSIS — S4980XA Other specified injuries of shoulder and upper arm, unspecified arm, initial encounter: Secondary | ICD-10-CM | POA: Insufficient documentation

## 2014-01-30 DIAGNOSIS — R799 Abnormal finding of blood chemistry, unspecified: Secondary | ICD-10-CM | POA: Insufficient documentation

## 2014-01-30 DIAGNOSIS — D649 Anemia, unspecified: Secondary | ICD-10-CM | POA: Insufficient documentation

## 2014-01-30 DIAGNOSIS — E7800 Pure hypercholesterolemia, unspecified: Secondary | ICD-10-CM | POA: Insufficient documentation

## 2014-01-30 DIAGNOSIS — I2510 Atherosclerotic heart disease of native coronary artery without angina pectoris: Secondary | ICD-10-CM | POA: Insufficient documentation

## 2014-01-30 DIAGNOSIS — E669 Obesity, unspecified: Secondary | ICD-10-CM | POA: Insufficient documentation

## 2014-01-30 DIAGNOSIS — Z950 Presence of cardiac pacemaker: Secondary | ICD-10-CM | POA: Insufficient documentation

## 2014-01-30 DIAGNOSIS — Y9289 Other specified places as the place of occurrence of the external cause: Secondary | ICD-10-CM | POA: Insufficient documentation

## 2014-01-30 DIAGNOSIS — R609 Edema, unspecified: Secondary | ICD-10-CM | POA: Insufficient documentation

## 2014-01-30 DIAGNOSIS — Z8669 Personal history of other diseases of the nervous system and sense organs: Secondary | ICD-10-CM | POA: Insufficient documentation

## 2014-01-30 DIAGNOSIS — R531 Weakness: Secondary | ICD-10-CM

## 2014-01-30 DIAGNOSIS — M479 Spondylosis, unspecified: Secondary | ICD-10-CM | POA: Insufficient documentation

## 2014-01-30 DIAGNOSIS — E785 Hyperlipidemia, unspecified: Secondary | ICD-10-CM | POA: Insufficient documentation

## 2014-01-30 DIAGNOSIS — Z9119 Patient's noncompliance with other medical treatment and regimen: Secondary | ICD-10-CM | POA: Insufficient documentation

## 2014-01-30 DIAGNOSIS — S46909A Unspecified injury of unspecified muscle, fascia and tendon at shoulder and upper arm level, unspecified arm, initial encounter: Secondary | ICD-10-CM | POA: Insufficient documentation

## 2014-01-30 DIAGNOSIS — R5381 Other malaise: Secondary | ICD-10-CM | POA: Insufficient documentation

## 2014-01-30 DIAGNOSIS — M1990 Unspecified osteoarthritis, unspecified site: Secondary | ICD-10-CM | POA: Insufficient documentation

## 2014-01-30 DIAGNOSIS — Z87891 Personal history of nicotine dependence: Secondary | ICD-10-CM | POA: Insufficient documentation

## 2014-01-30 DIAGNOSIS — M25519 Pain in unspecified shoulder: Secondary | ICD-10-CM

## 2014-01-30 DIAGNOSIS — S5000XA Contusion of unspecified elbow, initial encounter: Secondary | ICD-10-CM | POA: Insufficient documentation

## 2014-01-30 DIAGNOSIS — IMO0002 Reserved for concepts with insufficient information to code with codable children: Secondary | ICD-10-CM | POA: Insufficient documentation

## 2014-01-30 DIAGNOSIS — L408 Other psoriasis: Secondary | ICD-10-CM | POA: Insufficient documentation

## 2014-01-30 DIAGNOSIS — F0390 Unspecified dementia without behavioral disturbance: Secondary | ICD-10-CM | POA: Insufficient documentation

## 2014-01-30 LAB — CBC WITH DIFFERENTIAL/PLATELET
BASOS PCT: 0 % (ref 0–1)
Basophils Absolute: 0 10*3/uL (ref 0.0–0.1)
Eosinophils Absolute: 0.2 10*3/uL (ref 0.0–0.7)
Eosinophils Relative: 6 % — ABNORMAL HIGH (ref 0–5)
HCT: 27 % — ABNORMAL LOW (ref 39.0–52.0)
Hemoglobin: 9.3 g/dL — ABNORMAL LOW (ref 13.0–17.0)
Lymphocytes Relative: 21 % (ref 12–46)
Lymphs Abs: 0.7 10*3/uL (ref 0.7–4.0)
MCH: 33.1 pg (ref 26.0–34.0)
MCHC: 34.4 g/dL (ref 30.0–36.0)
MCV: 96.1 fL (ref 78.0–100.0)
Monocytes Absolute: 0.3 10*3/uL (ref 0.1–1.0)
Monocytes Relative: 8 % (ref 3–12)
NEUTROS ABS: 2.2 10*3/uL (ref 1.7–7.7)
Neutrophils Relative %: 65 % (ref 43–77)
Platelets: 159 10*3/uL (ref 150–400)
RBC: 2.81 MIL/uL — AB (ref 4.22–5.81)
RDW: 15.1 % (ref 11.5–15.5)
WBC: 3.3 10*3/uL — ABNORMAL LOW (ref 4.0–10.5)

## 2014-01-30 LAB — COMPREHENSIVE METABOLIC PANEL
ALBUMIN: 3.2 g/dL — AB (ref 3.5–5.2)
ALT: 14 U/L (ref 0–53)
AST: 28 U/L (ref 0–37)
Alkaline Phosphatase: 264 U/L — ABNORMAL HIGH (ref 39–117)
BILIRUBIN TOTAL: 1.1 mg/dL (ref 0.3–1.2)
BUN: 11 mg/dL (ref 6–23)
CHLORIDE: 101 meq/L (ref 96–112)
CO2: 28 mEq/L (ref 19–32)
Calcium: 8.9 mg/dL (ref 8.4–10.5)
Creatinine, Ser: 0.81 mg/dL (ref 0.50–1.35)
GFR calc Af Amer: 87 mL/min — ABNORMAL LOW (ref >90)
GFR calc non Af Amer: 75 mL/min — ABNORMAL LOW (ref >90)
Glucose, Bld: 123 mg/dL — ABNORMAL HIGH (ref 70–99)
Potassium: 3.6 mEq/L — ABNORMAL LOW (ref 3.7–5.3)
Sodium: 141 mEq/L (ref 137–147)
Total Protein: 6.9 g/dL (ref 6.0–8.3)

## 2014-01-30 LAB — URINALYSIS, ROUTINE W REFLEX MICROSCOPIC
GLUCOSE, UA: NEGATIVE mg/dL
Hgb urine dipstick: NEGATIVE
KETONES UR: NEGATIVE mg/dL
Leukocytes, UA: NEGATIVE
Nitrite: NEGATIVE
PH: 6 (ref 5.0–8.0)
Protein, ur: NEGATIVE mg/dL
Specific Gravity, Urine: 1.023 (ref 1.005–1.030)
Urobilinogen, UA: 1 mg/dL (ref 0.0–1.0)

## 2014-01-30 LAB — PRO B NATRIURETIC PEPTIDE: PRO B NATRI PEPTIDE: 875.1 pg/mL — AB (ref 0–450)

## 2014-01-30 MED ORDER — FUROSEMIDE 10 MG/ML IJ SOLN
40.0000 mg | Freq: Once | INTRAMUSCULAR | Status: AC
  Administered 2014-01-30: 40 mg via INTRAVENOUS
  Filled 2014-01-30: qty 4

## 2014-01-30 NOTE — Progress Notes (Addendum)
Weekend CSW, alongside CM, met with patient and his granddaughter Peter [name] who was present at the bedside. Patient states that he fell when getting out of his car at a gas station, and injured his arm as a result. His granddaughter states that her and an unwell aunt are the only caregivers. Patient stays with his granddaughter occasionally, but is independent and lives alone. CSW and CM spoke with patient and granddaughter about benefits of home health care. Granddaughter thinks that home health services will be very beneficial for patient, however patient is very resistant. Patient states that he will be independent and able to care for himself again once his arm heals. Patient is also very concerned about the appearance of his home and states that he has accumulated a lot of "stuff" over the years and is embarrassed for anyone to see it. Granddaughter confirms that patient's home needs cleaning and repairs but that he refuses to spend money on repairs. CSW and CM emphasized the importance of having increased support at home with home health due to his recent falling, decreased caregiver support in the home, and increase in falling. Granddaughter also emphasizing importance of home health- patient still resistant. Granddaughter suggested to patient that he go to a rehab center such as [facility] short term before returning home- patient seemed to contemplate idea. CSW informed granddaughter that patient could not be placed from ED but that CSW could make referrals with patient's permission for them to follow up with on Monday. Patient and granddaughter have no questions at this time. CSW and CM left room to allow patient and granddaughter to speak in private about options- will check back.  CSW and CM checked back in with patient and granddaughter. Patient and granddaughter state that they are interested in patient going to [facility] for short term rehab. CSW answered their questions and informed them that CSW  will fax referral to [facility] this afternoon and that granddaughter will need to follow up with facility on Monday. Granddaughter agreeable. CSW encouraged patient and granddaughter to follow up with PCP regarding any further SNF or home health needs. Patient staying with granddaughter over weekend. MD updated, agreeable to plan.  Tilden Fossa, MSW, [place] Clinical Social Worker [facility]. [phone]

## 2014-01-30 NOTE — ED Notes (Signed)
Pt given urinal to use at bedside to obtain specimen.  Pt and granddaughter confirmed pt ambulates at home.  Granddaughter in the hallway, okay with pt using the urinal with no assistance.

## 2014-01-30 NOTE — ED Provider Notes (Signed)
CSN: [id number]     Arrival date & time 01/30/14  [id number] History   First MD Initiated Contact with Patient 01/30/14 [phone]     Chief Complaint  Patient presents with  . Weakness    generalized  . Shoulder Pain    right     HPI  Patient presents with concern of generalized weakness, increasing lower extremity edema, persistent right shoulder pain.  On patient had a mechanical fall 1 week ago, was evaluated here.  He notes that since that time he has had pain in the right shoulder, elbow, with decreased ability to use the arm. Over approximately the same time patient has had generalized weakness and increasing edema in the lower extremities.  Patient denies chest pain, dyspnea, does endorse fatigue with exertion. He has stopped taking his medication several days ago, including Lasix. He has MMP, including metastatic prostate CA, pacer, CAD,   Past Medical History  Diagnosis Date  . Cardiac septal defect     closed self during childhood  . Blood transfusion     colon polypectomy, bleeding requiring transfusion  . Hx of colonic polyps 03/03/2005    adenomatous  . Obstructive sleep apnea     Does not use CPAP  . Anemia   . Diabetes mellitus   . Diverticulosis of colon   . GERD without esophagitis   . Hyperlipidemia   . Macular degeneration   . Memory loss of   . Osteoarthritis of spine   . Periumbilical hernia   . Psoriasis   . Chronic venous insufficiency   . AV block, complete   . Mobitz (type) II atrioventricular block   . Obesity, unspecified   . Left bundle branch hemiblock   . Pure hypercholesterolemia   . Cardiac pacemaker in situ 01/18/05    1st one inserted 01/18/05. 2nd one inserted 06/06/10.  Marland Kitchen LBBB (left bundle branch block)   . Lower extremity edema     Chronic   . Fluid retention in legs 01/15/2014  . Need for pneumococcal vaccination 01/15/2014  . Prostate cancer     Urologist watchfully waiting   Past Surgical History  Procedure Laterality Date  .  Tonsillectomy      Age 79  . Partial colectomy  07/09/02    For Polyps  . Pacemaker placement  01/18/05  . 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 Cataract extraction  6/12    left eye Dr Katy Fitch  . Pacemaker replacement  06/06/10    Dr Leonia Reeves   Family History  Problem Relation Age of Onset  . Dementia Mother     Died at 66  . CAD Mother   . Cancer Father   . Heart murmur Father   . Cancer Paternal Grandmother   . Kidney failure Maternal Uncle   . Alzheimer's disease Daughter   . COPD Daughter   . Cancer Brother    History  Substance Use Topics  . Smoking status: Former Smoker    Types: Pipe  . Smokeless tobacco: Former Systems developer  . Alcohol Use: Yes     Comment: 1 Beer Daily    Review of Systems  Constitutional:       Per HPI, otherwise negative  HENT:       Per HPI, otherwise negative  Respiratory:       Per HPI, otherwise negative  Cardiovascular:       Per HPI, otherwise negative  Gastrointestinal: Negative for vomiting.  Endocrine:  Negative aside from HPI  Genitourinary:       Neg aside from HPI   Musculoskeletal:       Per HPI, otherwise negative  Skin: Negative.   Neurological: Negative for syncope.      Allergies  Review of patient's allergies indicates no known allergies.  Home Medications   Current Outpatient Rx  Name  Route  Sig  Dispense  Refill  . abiraterone Acetate (ZYTIGA) 250 MG tablet   Oral   Take 2 tablets (500 mg total) by mouth daily. Take on an empty stomach 1 hour before or 2 hours after a meal   60 tablet   1   . CALCIUM CARBONATE-VIT D-MIN PO      (Calcium 600 mg Vitamin D3 800 Units) Take one tablet once a day         . Coenzyme Q10 (CO Q 10) 100 MG CAPS   Oral   Take 200 mg by mouth daily.          . feeding supplement (BOOST HIGH PROTEIN) LIQD   Oral   Take 1 Container by mouth every morning.         . furosemide (LASIX) 20 MG tablet   Oral   Take 1 tablet (20 mg total) by mouth daily.   30  tablet   1   . metFORMIN (GLUCOPHAGE-XR) 500 MG 24 hr tablet   Oral   Take 1 tablet (500 mg total) by mouth daily with breakfast.   90 tablet   1   . Multiple Vitamin (MULTIVITAMIN) tablet   Oral   Take 1 tablet by mouth daily.         . predniSONE (DELTASONE) 5 MG tablet   Oral   Take 1 tablet (5 mg total) by mouth 2 (two) times daily with a meal.   60 tablet   2   . traMADol (ULTRAM) 50 MG tablet   Oral   Take 1 tablet (50 mg total) by mouth every 6 (six) hours as needed.   15 tablet   0   . Vitamin D, Cholecalciferol, 1000 UNITS TABS   Oral   Take 1,000 Units by mouth daily.          BP 108/67  Pulse 73  Temp(Src) 98.1 F (36.7 C) (Oral)  Resp 17  Ht 5\' 6"  (1.676 m)  Wt 195 lb (88.451 kg)  BMI 31.49 kg/m2  SpO2 97% Physical Exam  Nursing note and vitals reviewed. Constitutional: He appears well-developed and well-nourished. No distress.  HENT:  Head: Normocephalic and atraumatic.  Eyes: Conjunctivae are normal. Right eye exhibits no discharge. Left eye exhibits no discharge.  Neck: No tracheal deviation present.  Cardiovascular: Normal rate, regular rhythm and intact distal pulses.   Murmur heard. Pulmonary/Chest: Effort normal. No stridor. He has decreased breath sounds. He has no wheezes.  Abdominal: Soft. Normal appearance. There is no tenderness.  Musculoskeletal:       Right shoulder: He exhibits decreased range of motion, tenderness, bony tenderness, swelling, effusion, deformity and pain. He exhibits no crepitus, no laceration and no spasm.       Left shoulder: Normal.       Right elbow: He exhibits decreased range of motion. He exhibits no swelling, no effusion, no deformity and no laceration. Tenderness found.       Right wrist: Normal.  2+ le edema, symmetric, bilateral.   Skin: He is not diaphoretic.    ED Course  Procedures (including  critical care time) Labs Review Labs Reviewed  CBC WITH DIFFERENTIAL - Abnormal; Notable for the  following:    WBC 3.3 (*)    RBC 2.81 (*)    Hemoglobin 9.3 (*)    HCT 27.0 (*)    Eosinophils Relative 6 (*)    All other components within normal limits  COMPREHENSIVE METABOLIC PANEL - Abnormal; Notable for the following:    Potassium 3.6 (*)    Glucose, Bld 123 (*)    Albumin 3.2 (*)    Alkaline Phosphatase 264 (*)    GFR calc non Af Amer 75 (*)    GFR calc Af Amer 87 (*)    All other components within normal limits  PRO B NATRIURETIC PEPTIDE - Abnormal; Notable for the following:    Pro B Natriuretic peptide (BNP) 875.1 (*)    All other components within normal limits  URINALYSIS, ROUTINE W REFLEX MICROSCOPIC - Abnormal; Notable for the following:    Color, Urine AMBER (*)    Bilirubin Urine SMALL (*)    All other components within normal limits   Imaging Review No results found.   EKG Interpretation   Date/Time:  Saturday January 30 2014 08:29:08 EDT Ventricular Rate:  52 PR Interval:    QRS Duration: 167 QT Interval:  478 QTC Calculation: 537 R Axis:   -68 Text Interpretation:  Age not entered, assumed to be  80 years old for  purpose of ECG interpretation Atrial fibrillation Nonspecific IVCD with  LAD LVH with secondary repolarization abnormality Inferior infarct, acute  Anterior infarct, acute (LAD) Lateral leads are also involved Electronic  atrial pacemaker Left bundle branch block No significant change since last  tracing Abnormal ekg Confirmed by Carmin Muskrat  MD ([id number]) on 01/30/2014  8:43:37 AM     ECG is abnormal, but paced rhythm is not completely picked up.  Patient has no CP, no dyspnea, no active complaints.  Acute MI is unlikely.  Cardiac: 75 paced, abnormal   I reviewed the EMR.  Update: After initial labs were resulted I discussed all findings with the patient and his granddaughter. Patient has no ongoing complaints.  We discussed the importance of medication compliance. I will have our case management team to see the patient for assistance  with resources.   1:23 PM Patient remains in no distress.  After discussing his health with case management, the patient defers any placement in a nursing home, but seems amenable to have a possible home health services and/or rehabilitation services.   MDM   Final diagnoses:  Weakness  Shoulder pain    Patient presents one week after a fall with ongoing weakness.  On exam patient is awake, alert, and hypoxia, or evidence of distress. Patient's medication noncompliance likely explains his lower extremity edema, as he is in no distress. Patient's dementia here is largely reassuring, though there is elevated BNP.  Patient was provided IV Lasix.  With largely reassuring results, the patient's case was discussed with case management.  The patient is not amenable to nursing home placement, but will receive home health assessment, rehabilitation services.    Carmin Muskrat, MD 01/30/14 1325

## 2014-01-30 NOTE — ED Notes (Signed)
Pt presents from home with c/o of increased weakness that has been ongoing for greater than 2 weeks.  Pt was seen here 1 week ago for shoulder pain from a fall.  Pt right shoulder is swollen, limited movement, bruising around the inner aspect of the elbow.  Pt also has bilateral lower extremity swelling.  Grandaughter states pt does not keep his feet elevated at home.

## 2014-01-30 NOTE — Discharge Instructions (Signed)
As discussed, your evaluation today has been largely reassuring.  But, it is important that you monitor your condition carefully, and do not hesitate to return to the ED if you develop new, or concerning changes in your condition.  Please take advantage of the resources provided to you today.  Make sure that you take all medication as directed.  Please follow-up with your physician for appropriate ongoing care.

## 2014-01-30 NOTE — ED Notes (Signed)
Case management in room 

## 2014-01-30 NOTE — ED Notes (Signed)
Dr.Lockwood at bedside  

## 2014-01-30 NOTE — ED Notes (Signed)
Pt knows that urine is needed.

## 2014-01-30 NOTE — Progress Notes (Signed)
Case Manager Consult for Discharge Planning.Met Peter [name] and patients grand daughter Peter [name] at bedside.Role of CM explained.Peter [name] reports he had a fall at a local parking lot and sprained his right arm. Peter [name] reports Peter [name] has not been compliant with his Medications and she is providing as much support and assistance as possible.CM and Education officer, museum both provided education on patients Plan of care. CM provided Home health CHOICE list and provided information on Home health Services and how there implementation would assist the Peter [name] with his activities of living, and medication needs.Peter [name] Reports he does not want to pursue home health options at this present time.Patients granddaughter encouraged Peter [name] to reconsider.Peter [name] reports his house has lots of clutter and he did not want others To see it.CM provided encouragement that Home health would be providing help and Peter [name] safety was a priority.Again Peter [name] resistant.Patients granddaughter suggested short term rehab at [facility]. Peter [name] was receptive to this.SW educated Peter [name] that she could assist with the referral process and Peter [name] reported she would follow up on Monday.Education provided to Peter [name] / Peter [name] on working with the  Patients PCP.[place] educated the PCP can also assist with any potential home health needs.Peter [name] and Peter [name] are aware of current plan of care and were receptive to SW and CM support today.CM and SW  Spoke with Dr Vanita Panda regarding patients decision to decline home health and elect Short term rehab at [facility].No further CM needs at this time.

## 2014-02-10 ENCOUNTER — Encounter: Payer: Self-pay | Admitting: Internal Medicine

## 2014-02-17 ENCOUNTER — Other Ambulatory Visit: Payer: Self-pay | Admitting: Hematology and Oncology

## 2014-02-17 ENCOUNTER — Encounter: Payer: Medicare Other | Admitting: Internal Medicine

## 2014-02-24 ENCOUNTER — Ambulatory Visit (HOSPITAL_BASED_OUTPATIENT_CLINIC_OR_DEPARTMENT_OTHER): Payer: Medicare Other | Admitting: Hematology and Oncology

## 2014-02-24 ENCOUNTER — Ambulatory Visit (HOSPITAL_BASED_OUTPATIENT_CLINIC_OR_DEPARTMENT_OTHER): Payer: BC Managed Care – PPO

## 2014-02-24 ENCOUNTER — Encounter: Payer: Self-pay | Admitting: Hematology and Oncology

## 2014-02-24 ENCOUNTER — Other Ambulatory Visit (HOSPITAL_BASED_OUTPATIENT_CLINIC_OR_DEPARTMENT_OTHER): Payer: Medicare Other

## 2014-02-24 ENCOUNTER — Telehealth: Payer: Self-pay | Admitting: Hematology and Oncology

## 2014-02-24 VITALS — BP 121/63 | HR 95 | Temp 98.3°F | Resp 18 | Ht 66.0 in | Wt 199.0 lb

## 2014-02-24 DIAGNOSIS — R5383 Other fatigue: Secondary | ICD-10-CM

## 2014-02-24 DIAGNOSIS — C779 Secondary and unspecified malignant neoplasm of lymph node, unspecified: Secondary | ICD-10-CM

## 2014-02-24 DIAGNOSIS — C7952 Secondary malignant neoplasm of bone marrow: Secondary | ICD-10-CM

## 2014-02-24 DIAGNOSIS — C61 Malignant neoplasm of prostate: Secondary | ICD-10-CM

## 2014-02-24 DIAGNOSIS — C7951 Secondary malignant neoplasm of bone: Secondary | ICD-10-CM

## 2014-02-24 DIAGNOSIS — Z5111 Encounter for antineoplastic chemotherapy: Secondary | ICD-10-CM

## 2014-02-24 DIAGNOSIS — D649 Anemia, unspecified: Secondary | ICD-10-CM

## 2014-02-24 DIAGNOSIS — R600 Localized edema: Secondary | ICD-10-CM

## 2014-02-24 DIAGNOSIS — R5381 Other malaise: Secondary | ICD-10-CM

## 2014-02-24 DIAGNOSIS — M25511 Pain in right shoulder: Secondary | ICD-10-CM

## 2014-02-24 DIAGNOSIS — R630 Anorexia: Secondary | ICD-10-CM

## 2014-02-24 LAB — CBC WITH DIFFERENTIAL/PLATELET
BASO%: 1 % (ref 0.0–2.0)
BASOS ABS: 0 10*3/uL (ref 0.0–0.1)
EOS ABS: 0.2 10*3/uL (ref 0.0–0.5)
EOS%: 4 % (ref 0.0–7.0)
HCT: 25.5 % — ABNORMAL LOW (ref 38.4–49.9)
HEMOGLOBIN: 8.7 g/dL — AB (ref 13.0–17.1)
LYMPH%: 16.7 % (ref 14.0–49.0)
MCH: 32.2 pg (ref 27.2–33.4)
MCHC: 34.1 g/dL (ref 32.0–36.0)
MCV: 94.6 fL (ref 79.3–98.0)
MONO#: 0.3 10*3/uL (ref 0.1–0.9)
MONO%: 6.3 % (ref 0.0–14.0)
NEUT%: 72 % (ref 39.0–75.0)
NEUTROS ABS: 3.3 10*3/uL (ref 1.5–6.5)
Platelets: 173 10*3/uL (ref 140–400)
RBC: 2.69 10*6/uL — ABNORMAL LOW (ref 4.20–5.82)
RDW: 14.6 % (ref 11.0–14.6)
WBC: 4.5 10*3/uL (ref 4.0–10.3)
lymph#: 0.8 10*3/uL — ABNORMAL LOW (ref 0.9–3.3)

## 2014-02-24 LAB — COMPREHENSIVE METABOLIC PANEL (CC13)
ALT: 10 U/L (ref 0–55)
ANION GAP: 11 meq/L (ref 3–11)
AST: 26 U/L (ref 5–34)
Albumin: 3.1 g/dL — ABNORMAL LOW (ref 3.5–5.0)
Alkaline Phosphatase: 262 U/L — ABNORMAL HIGH (ref 40–150)
BUN: 17.4 mg/dL (ref 7.0–26.0)
CALCIUM: 9.2 mg/dL (ref 8.4–10.4)
CHLORIDE: 103 meq/L (ref 98–109)
CO2: 26 meq/L (ref 22–29)
CREATININE: 1 mg/dL (ref 0.7–1.3)
Glucose: 186 mg/dl — ABNORMAL HIGH (ref 70–140)
Potassium: 3.9 mEq/L (ref 3.5–5.1)
Sodium: 139 mEq/L (ref 136–145)
Total Bilirubin: 0.71 mg/dL (ref 0.20–1.20)
Total Protein: 7 g/dL (ref 6.4–8.3)

## 2014-02-24 MED ORDER — DEGARELIX ACETATE 80 MG ~~LOC~~ SOLR
80.0000 mg | SUBCUTANEOUS | Status: DC
  Administered 2014-02-24: 80 mg via SUBCUTANEOUS
  Filled 2014-02-24: qty 4

## 2014-02-24 NOTE — Progress Notes (Signed)
[facility] OFFICE PROGRESS NOTE  Patient Care Team: Lupita [name], MD as PCP - General (Family Medicine) Heath Lark, MD as Consulting Physician (Hematology and Oncology)  DIAGNOSIS: Metastatic prostate cancer to the bone, ongoing treatment  SUMMARY OF ONCOLOGIC HISTORY: This is a pleasant 80 year old gentleman presented in the hospital with weakness and subsequently was found to have widespread metastatic cancer with a PSA were 5000. He never had a biopsy performed due to his age He was started on hormonal manipulation in May of 2014 and currently on a maintain his treatment every month. In October 2014, PSA started to rise. Casodex was added. In December 2014, PSA continue to rise. Casodex was stopped and he was switched to [facility] In January 2015, I added prednisone to help with energy and appetite.  INTERVAL HISTORY: Peter [name] 80 y.o. male returns for further followup. He had accidental fall recently and hurt his right shoulder. X-rays were performed which showed no evidence of fracture or dislocation. However, he has difficulties mobilizing his right arm. He also appears to be confused about his medications and it is not clear whether he is taking all the prescribed medications correctly. His appetite is fair. Denies any other areas of bone pain.  I have reviewed the past medical history, past surgical history, social history and family history with the patient and they are unchanged from previous note.  ALLERGIES:  has No Known Allergies.  MEDICATIONS:  Current Outpatient Prescriptions  Medication Sig Dispense Refill  . abiraterone Acetate (ZYTIGA) 250 MG tablet Take 2 tablets (500 mg total) by mouth daily. Take on an empty stomach 1 hour before or 2 hours after a meal  60 tablet  1  . CALCIUM CARBONATE-VIT D-MIN PO (Calcium 600 mg Vitamin D3 800 Units) Take one tablet once a day      . Coenzyme Q10 (CO Q 10) 100 MG CAPS Take 200 mg by mouth daily.        . feeding supplement (BOOST HIGH PROTEIN) LIQD Take 1 Container by mouth every morning.      . furosemide (LASIX) 20 MG tablet Take 1 tablet (20 mg total) by mouth daily.  30 tablet  1  . metFORMIN (GLUCOPHAGE-XR) 500 MG 24 hr tablet Take 1 tablet (500 mg total) by mouth daily with breakfast.  90 tablet  1  . Multiple Vitamin (MULTIVITAMIN) tablet Take 1 tablet by mouth daily.      . predniSONE (DELTASONE) 5 MG tablet TAKE ONE TABLET BY MOUTH TWICE DAILY WITH A MEAL.  60 tablet  0  . traMADol (ULTRAM) 50 MG tablet Take 1 tablet (50 mg total) by mouth every 6 (six) hours as needed.  15 tablet  0  . Vitamin D, Cholecalciferol, 1000 UNITS TABS Take 1,000 Units by mouth daily.       No current facility-administered medications for this visit.    REVIEW OF SYSTEMS:   Constitutional: Denies fevers, chills or abnormal weight loss Eyes: Denies blurriness of vision Ears, nose, mouth, throat, and face: Denies mucositis or sore throat Respiratory: Denies cough, dyspnea or wheezes Cardiovascular: Denies palpitation, chest discomfort or lower extremity swelling Gastrointestinal:  Denies nausea, heartburn or change in bowel habits Skin: Denies abnormal skin rashes Lymphatics: Denies new lymphadenopathy or easy bruising Neurological:Denies numbness, tingling or new weaknesses Behavioral/Psych: Mood is stable, no new changes  All other systems were reviewed with the patient and are negative.  PHYSICAL EXAMINATION: ECOG PERFORMANCE STATUS: 1 - Symptomatic but completely  ambulatory  Filed Vitals:   02/24/14 1503  BP: 121/63  Pulse: 95  Temp: 98.3 F (36.8 C)  Resp: 18   Filed Weights   02/24/14 1503  Weight: 199 lb (90.266 kg)    GENERAL:alert, no distress and comfortable. He looks elderly and unkempt SKIN: skin color, texture, turgor are normal, no rashes or significant lesions EYES: normal, Conjunctiva are pink and non-injected, sclera clear OROPHARYNX:no exudate, no erythema and lips,  buccal mucosa, and tongue normal. Very poor dentition is noted Musculoskeletal:no cyanosis of digits and no clubbing  NEURO: alert & oriented x 3 with fluent speech, no focal motor/sensory deficits  LABORATORY DATA:  I have reviewed the data as listed    Component Value Date/Time   NA 139 02/24/2014 1443   NA 141 01/30/2014 0855   K 3.9 02/24/2014 1443   K 3.6* 01/30/2014 0855   CL 101 01/30/2014 0855   CL 103 04/23/2013 1238   CO2 26 02/24/2014 1443   CO2 28 01/30/2014 0855   GLUCOSE 186* 02/24/2014 1443   GLUCOSE 123* 01/30/2014 0855   GLUCOSE 186* 04/23/2013 1238   BUN 17.4 02/24/2014 1443   BUN 11 01/30/2014 0855   CREATININE 1.0 02/24/2014 1443   CREATININE 0.81 01/30/2014 0855   CREATININE 0.99 02/12/2012 1546   CALCIUM 9.2 02/24/2014 1443   CALCIUM 8.9 01/30/2014 0855   PROT 7.0 02/24/2014 1443   PROT 6.9 01/30/2014 0855   ALBUMIN 3.1* 02/24/2014 1443   ALBUMIN 3.2* 01/30/2014 0855   AST 26 02/24/2014 1443   AST 28 01/30/2014 0855   ALT 10 02/24/2014 1443   ALT 14 01/30/2014 0855   ALKPHOS 262* 02/24/2014 1443   ALKPHOS 264* 01/30/2014 0855   BILITOT 0.71 02/24/2014 1443   BILITOT 1.1 01/30/2014 0855   GFRNONAA 75* 01/30/2014 0855   GFRAA 87* 01/30/2014 0855    No results found for this basename: SPEP,  UPEP,   kappa and lambda light chains    Lab Results  Component Value Date   WBC 4.5 02/24/2014   NEUTROABS 3.3 02/24/2014   HGB 8.7* 02/24/2014   HCT 25.5* 02/24/2014   MCV 94.6 02/24/2014   PLT 173 02/24/2014      Chemistry      Component Value Date/Time   NA 139 02/24/2014 1443   NA 141 01/30/2014 0855   K 3.9 02/24/2014 1443   K 3.6* 01/30/2014 0855   CL 101 01/30/2014 0855   CL 103 04/23/2013 1238   CO2 26 02/24/2014 1443   CO2 28 01/30/2014 0855   BUN 17.4 02/24/2014 1443   BUN 11 01/30/2014 0855   CREATININE 1.0 02/24/2014 1443   CREATININE 0.81 01/30/2014 0855   CREATININE 0.99 02/12/2012 1546      Component Value Date/Time   CALCIUM 9.2 02/24/2014 1443   CALCIUM 8.9 01/30/2014 0855    ALKPHOS 262* 02/24/2014 1443   ALKPHOS 264* 01/30/2014 0855   AST 26 02/24/2014 1443   AST 28 01/30/2014 0855   ALT 10 02/24/2014 1443   ALT 14 01/30/2014 0855   BILITOT 0.71 02/24/2014 1443   BILITOT 1.1 01/30/2014 0855     RADIOGRAPHIC STUDIES: I reviewed the x-rays and agree there were no evidence of dislocation or fracture  I have personally reviewed the radiological images as listed and agreed with the findings in the report.  ASSESSMENT & PLAN:  #1 metastatic prostate cancer to the lymph nodes and bone The patient has failed therapy with complete androgen deprivation. The  patient have widespread bony metastasis. Ideally he should be started on IV bisphosphonates. However he has very poor dentition. The patient declined visiting the dentist. For that reason he's not on IV bisphosphonates. In the meantime, we will continue with monthly Firmagon. He is doing well on low-dose prednisone twice a day and when I restarted Zytiga at 50% dose adjustment he is doing well.  His last PSA was high but was improving. PSA from today is pending. I will call the patient once I have test result. If his PSA continued to rise, we will discontinue Zytiga and consider chemotherapy. #2 anemia This is likely anemia of chronic disease. The patient denies recent history of bleeding such as epistaxis, hematuria or hematochezia. He is asymptomatic from the anemia. We will observe for now.  He does not require transfusion now.  #3 shoulder pain I recommend orthopedic consult. He may benefit from some injection and physical therapy.  #4 poor appetite and fatigue This is improved with prednisone.  #5 memory I recommend that him to home care to review medications at home and to assess his home situation due to recent fall. The patient wants to think about it.  Orders Placed This Encounter  Procedures  . CBC with Differential    Standing Status: Future     Number of Occurrences:      Standing Expiration Date:  02/24/2015  . Comprehensive metabolic panel    Standing Status: Future     Number of Occurrences:      Standing Expiration Date: 02/24/2015  . Ferritin    Standing Status: Future     Number of Occurrences:      Standing Expiration Date: 02/24/2015  . Vitamin B12    Standing Status: Future     Number of Occurrences:      Standing Expiration Date: 02/24/2015  . PSA    Standing Status: Future     Number of Occurrences:      Standing Expiration Date: 02/24/2015  . Testosterone    Standing Status: Future     Number of Occurrences:      Standing Expiration Date: 02/24/2015  . AMB referral to orthopedics    Referral Priority:  Routine    Referral Type:  Consultation    Number of Visits Requested:  1   All questions were answered. The patient knows to call the clinic with any problems, questions or concerns. No barriers to learning was detected. I spent 40 minutes counseling the patient face to face. The total time spent in the appointment was 60 minutes and more than 50% was on counseling and review of test results     Heath Lark, MD 02/24/2014 8:12 PM

## 2014-02-24 NOTE — Telephone Encounter (Signed)
gv adn printed appt sched and avs foro pt for May....pt sched to see Dr. Tamera Punt on 5.1.15 @ 12:45pm with [facility]

## 2014-02-25 ENCOUNTER — Telehealth: Payer: Self-pay | Admitting: *Deleted

## 2014-02-25 LAB — PSA: PSA: 330.1 ng/mL — AB (ref <=4.00)

## 2014-02-25 NOTE — Telephone Encounter (Signed)
Pt notified of PSA results. Pt very pleased and thankful to Dr Alvy Bimler!

## 2014-02-25 NOTE — Telephone Encounter (Signed)
Message copied by Patton Salles on Thu Feb 25, 2014  9:15 AM ------      Message from: [facility], [place]: Thu Feb 25, 2014  8:18 AM      Regarding: PSA result       Pls call pt to tell him the good news      ----- Message -----         From: [facility]         Sent: 02/24/2014   3:06 PM           To: Heath Lark, MD                   ------

## 2014-03-06 ENCOUNTER — Emergency Department (HOSPITAL_COMMUNITY)
Admission: EM | Admit: 2014-03-06 | Discharge: 2014-03-06 | Disposition: A | Discharge status: Home / Self Care | Payer: Medicare Other | Attending: Emergency Medicine | Admitting: Emergency Medicine

## 2014-03-06 ENCOUNTER — Emergency Department (HOSPITAL_COMMUNITY): Payer: Medicare Other

## 2014-03-06 ENCOUNTER — Encounter (HOSPITAL_COMMUNITY): Payer: Self-pay | Admitting: Emergency Medicine

## 2014-03-06 DIAGNOSIS — Z87891 Personal history of nicotine dependence: Secondary | ICD-10-CM | POA: Insufficient documentation

## 2014-03-06 DIAGNOSIS — E7800 Pure hypercholesterolemia, unspecified: Secondary | ICD-10-CM | POA: Insufficient documentation

## 2014-03-06 DIAGNOSIS — Z79899 Other long term (current) drug therapy: Secondary | ICD-10-CM | POA: Insufficient documentation

## 2014-03-06 DIAGNOSIS — E785 Hyperlipidemia, unspecified: Secondary | ICD-10-CM | POA: Insufficient documentation

## 2014-03-06 DIAGNOSIS — Z860100 Personal history of colon polyps, unspecified: Secondary | ICD-10-CM | POA: Insufficient documentation

## 2014-03-06 DIAGNOSIS — Z862 Personal history of diseases of the blood and blood-forming organs and certain disorders involving the immune mechanism: Secondary | ICD-10-CM | POA: Insufficient documentation

## 2014-03-06 DIAGNOSIS — Z8546 Personal history of malignant neoplasm of prostate: Secondary | ICD-10-CM | POA: Insufficient documentation

## 2014-03-06 DIAGNOSIS — L539 Erythematous condition, unspecified: Secondary | ICD-10-CM | POA: Insufficient documentation

## 2014-03-06 DIAGNOSIS — Z8719 Personal history of other diseases of the digestive system: Secondary | ICD-10-CM | POA: Insufficient documentation

## 2014-03-06 DIAGNOSIS — R531 Weakness: Secondary | ICD-10-CM

## 2014-03-06 DIAGNOSIS — E119 Type 2 diabetes mellitus without complications: Secondary | ICD-10-CM | POA: Insufficient documentation

## 2014-03-06 DIAGNOSIS — R609 Edema, unspecified: Secondary | ICD-10-CM | POA: Insufficient documentation

## 2014-03-06 DIAGNOSIS — R233 Spontaneous ecchymoses: Secondary | ICD-10-CM | POA: Insufficient documentation

## 2014-03-06 DIAGNOSIS — M479 Spondylosis, unspecified: Secondary | ICD-10-CM | POA: Insufficient documentation

## 2014-03-06 DIAGNOSIS — Z950 Presence of cardiac pacemaker: Secondary | ICD-10-CM | POA: Insufficient documentation

## 2014-03-06 DIAGNOSIS — Z9181 History of falling: Secondary | ICD-10-CM | POA: Insufficient documentation

## 2014-03-06 DIAGNOSIS — Z8669 Personal history of other diseases of the nervous system and sense organs: Secondary | ICD-10-CM | POA: Insufficient documentation

## 2014-03-06 DIAGNOSIS — E669 Obesity, unspecified: Secondary | ICD-10-CM | POA: Insufficient documentation

## 2014-03-06 DIAGNOSIS — R5381 Other malaise: Secondary | ICD-10-CM | POA: Insufficient documentation

## 2014-03-06 DIAGNOSIS — R5383 Other fatigue: Principal | ICD-10-CM

## 2014-03-06 LAB — COMPREHENSIVE METABOLIC PANEL
ALBUMIN: 3.2 g/dL — AB (ref 3.5–5.2)
ALT: 12 U/L (ref 0–53)
AST: 25 U/L (ref 0–37)
Alkaline Phosphatase: 249 U/L — ABNORMAL HIGH (ref 39–117)
BILIRUBIN TOTAL: 0.6 mg/dL (ref 0.3–1.2)
BUN: 15 mg/dL (ref 6–23)
CALCIUM: 9.1 mg/dL (ref 8.4–10.5)
CHLORIDE: 97 meq/L (ref 96–112)
CO2: 27 mEq/L (ref 19–32)
Creatinine, Ser: 0.95 mg/dL (ref 0.50–1.35)
GFR calc Af Amer: 81 mL/min — ABNORMAL LOW (ref >90)
GFR calc non Af Amer: 70 mL/min — ABNORMAL LOW (ref >90)
Glucose, Bld: 155 mg/dL — ABNORMAL HIGH (ref 70–99)
Potassium: 3.6 mEq/L — ABNORMAL LOW (ref 3.7–5.3)
Sodium: 137 mEq/L (ref 137–147)
Total Protein: 7.2 g/dL (ref 6.0–8.3)

## 2014-03-06 LAB — CBC WITH DIFFERENTIAL/PLATELET
BASOS ABS: 0 10*3/uL (ref 0.0–0.1)
Basophils Relative: 1 % (ref 0–1)
EOS ABS: 0.2 10*3/uL (ref 0.0–0.7)
Eosinophils Relative: 4 % (ref 0–5)
HCT: 25.8 % — ABNORMAL LOW (ref 39.0–52.0)
Hemoglobin: 8.8 g/dL — ABNORMAL LOW (ref 13.0–17.0)
Lymphocytes Relative: 23 % (ref 12–46)
Lymphs Abs: 1 10*3/uL (ref 0.7–4.0)
MCH: 31.7 pg (ref 26.0–34.0)
MCHC: 34.1 g/dL (ref 30.0–36.0)
MCV: 92.8 fL (ref 78.0–100.0)
Monocytes Absolute: 0.4 10*3/uL (ref 0.1–1.0)
Monocytes Relative: 10 % (ref 3–12)
NEUTROS ABS: 2.8 10*3/uL (ref 1.7–7.7)
Neutrophils Relative %: 63 % (ref 43–77)
Platelets: 199 10*3/uL (ref 150–400)
RBC: 2.78 MIL/uL — ABNORMAL LOW (ref 4.22–5.81)
RDW: 13.9 % (ref 11.5–15.5)
WBC: 4.4 10*3/uL (ref 4.0–10.5)

## 2014-03-06 LAB — URINALYSIS, ROUTINE W REFLEX MICROSCOPIC
BILIRUBIN URINE: NEGATIVE
Glucose, UA: NEGATIVE mg/dL
HGB URINE DIPSTICK: NEGATIVE
Ketones, ur: NEGATIVE mg/dL
Leukocytes, UA: NEGATIVE
Nitrite: NEGATIVE
PROTEIN: NEGATIVE mg/dL
SPECIFIC GRAVITY, URINE: 1.024 (ref 1.005–1.030)
Urobilinogen, UA: 1 mg/dL (ref 0.0–1.0)
pH: 5.5 (ref 5.0–8.0)

## 2014-03-06 LAB — PRO B NATRIURETIC PEPTIDE: Pro B Natriuretic peptide (BNP): 678.8 pg/mL — ABNORMAL HIGH (ref 0–450)

## 2014-03-06 MED ORDER — DIPHENHYDRAMINE HCL 50 MG/ML IJ SOLN
12.5000 mg | Freq: Once | INTRAMUSCULAR | Status: AC
  Administered 2014-03-06: 12.5 mg via INTRAVENOUS
  Filled 2014-03-06: qty 1

## 2014-03-06 NOTE — ED Provider Notes (Signed)
CSN: [id number]     Arrival date & time 03/06/14  2027 History   First MD Initiated Contact with Patient 03/06/14 2034     Chief Complaint  Patient presents with  . Weakness     (Consider location/radiation/quality/duration/timing/severity/associated sxs/prior Treatment) The history is provided by a caregiver.  Peter [name] is a 80 y.o. male hx of DM, HL, prostate cancer not currently treated due to advanced age, here presenting with frequent falls and weakness. Patient was seen a month ago after a fall. He was referred to get rehabilitation. However he has been staying with his granddaughter. He fell a week ago and hit his face but denies head injury. Had some swelling anterior neck but denies neck pain. He has been feeling more weak recently. Denies chest pain or shortness of breath or fever or cough or vomiting. Granddaughter is his only care taker and she doesn't know what to do.   Level V caveat- condition of patient    Past Medical History  Diagnosis Date  . Cardiac septal defect     closed self during childhood  . Blood transfusion     colon polypectomy, bleeding requiring transfusion  . Hx of colonic polyps 03/03/2005    adenomatous  . Obstructive sleep apnea     Does not use CPAP  . Anemia   . Diabetes mellitus   . Diverticulosis of colon   . GERD without esophagitis   . Hyperlipidemia   . Macular degeneration   . Memory loss of   . Osteoarthritis of spine   . Periumbilical hernia   . Psoriasis   . Chronic venous insufficiency   . AV block, complete   . Mobitz (type) II atrioventricular block   . Obesity, unspecified   . Left bundle branch hemiblock   . Pure hypercholesterolemia   . Cardiac pacemaker in situ 01/18/05    1st one inserted 01/18/05. 2nd one inserted 06/06/10.  Marland Kitchen LBBB (left bundle branch block)   . Lower extremity edema     Chronic   . Fluid retention in legs 01/15/2014  . Need for pneumococcal vaccination 01/15/2014  . Prostate cancer    Urologist watchfully waiting   Past Surgical History  Procedure Laterality Date  . Tonsillectomy      Age 48  . Partial colectomy  07/09/02    For Polyps  . Pacemaker placement  01/18/05  . 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 Cataract extraction  6/12    left eye Dr Katy Fitch  . Pacemaker replacement  06/06/10    Dr Leonia Reeves   Family History  Problem Relation Age of Onset  . Dementia Mother     Died at 80  . CAD Mother   . Cancer Father   . Heart murmur Father   . Cancer Paternal Grandmother   . Kidney failure Maternal Uncle   . Alzheimer's disease Daughter   . COPD Daughter   . Cancer Brother    History  Substance Use Topics  . Smoking status: Former Smoker    Types: Pipe  . Smokeless tobacco: Former Systems developer  . Alcohol Use: Yes     Comment: 1 Beer Daily    Review of Systems  Neurological: Positive for weakness.  All other systems reviewed and are negative.     Allergies  Review of patient's allergies indicates no known allergies.  Home Medications   Prior to Admission medications   Medication Sig Start Date End Date Taking?  Authorizing Provider  abiraterone Acetate (ZYTIGA) 250 MG tablet Take 2 tablets (500 mg total) by mouth daily. Take on an empty stomach 1 hour before or 2 hours after a meal 01/18/14   Heath Lark, MD  CALCIUM CARBONATE-VIT D-MIN PO (Calcium 600 mg Vitamin D3 800 Units) Take one tablet once a day    Historical Provider, MD  Coenzyme Q10 (CO Q 10) 100 MG CAPS Take 200 mg by mouth daily.     Historical Provider, MD  feeding supplement (BOOST HIGH PROTEIN) LIQD Take 1 Container by mouth every morning.    Historical Provider, MD  furosemide (LASIX) 20 MG tablet Take 1 tablet (20 mg total) by mouth daily. 01/15/14   Heath Lark, MD  metFORMIN (GLUCOPHAGE-XR) 500 MG 24 hr tablet Take 1 tablet (500 mg total) by mouth daily with breakfast. 08/21/13   Lupita [name], MD  Multiple Vitamin (MULTIVITAMIN) tablet Take 1 tablet by mouth daily.    Historical  Provider, MD  predniSONE (DELTASONE) 5 MG tablet TAKE ONE TABLET BY MOUTH TWICE DAILY WITH A MEAL. 02/17/14   Heath Lark, MD  traMADol (ULTRAM) 50 MG tablet Take 1 tablet (50 mg total) by mouth every 6 (six) hours as needed. 01/21/14   Jasper Riling. Pickering, MD  Vitamin D, Cholecalciferol, 1000 UNITS TABS Take 1,000 Units by mouth daily. 01/19/14   Historical Provider, MD   BP 124/47  Pulse 81  Temp(Src) 98.2 F (36.8 C) (Oral)  Resp 20  SpO2 100% Physical Exam  Nursing note and vitals reviewed. Constitutional: He is oriented to person, place, and time.  Chronically ill  HENT:  Head: Normocephalic and atraumatic.  Mouth/Throat: Oropharynx is clear and moist.  Eyes: Conjunctivae and EOM are normal. Pupils are equal, round, and reactive to light.  Neck: Normal range of motion.  Ecchymosis anterior neck, nontender, no obvious hematoma. No midline cervical spine tenderness   Cardiovascular: Normal rate, regular rhythm and normal heart sounds.   Pulmonary/Chest: Effort normal.  Dec breath sounds bilateral bases   Abdominal: Soft. Bowel sounds are normal. He exhibits no distension. There is no tenderness. There is no rebound.  Musculoskeletal: Normal range of motion.  2+ edema bilaterally. Some erythema but no obvious cellulitis   Neurological: He is alert and oriented to person, place, and time.  Strength 4/5 throughout   Skin: Skin is warm and dry.  Psychiatric: He has a normal mood and affect. His behavior is normal. Judgment and thought content normal.    ED Course  Procedures (including critical care time) Labs Review Labs Reviewed  CBC WITH DIFFERENTIAL - Abnormal; Notable for the following:    RBC 2.78 (*)    Hemoglobin 8.8 (*)    HCT 25.8 (*)    All other components within normal limits  COMPREHENSIVE METABOLIC PANEL - Abnormal; Notable for the following:    Potassium 3.6 (*)    Glucose, Bld 155 (*)    Albumin 3.2 (*)    Alkaline Phosphatase 249 (*)    GFR calc non Af Amer  70 (*)    GFR calc Af Amer 81 (*)    All other components within normal limits  URINALYSIS, ROUTINE W REFLEX MICROSCOPIC - Abnormal; Notable for the following:    Color, Urine AMBER (*)    All other components within normal limits  PRO B NATRIURETIC PEPTIDE - Abnormal; Notable for the following:    Pro B Natriuretic peptide (BNP) 678.8 (*)    All other components within normal  limits  URINE CULTURE    Imaging Review Dg Chest 2 View  03/06/2014   CLINICAL DATA:  Fall.  Weakness.  EXAM: CHEST  2 VIEW  COMPARISON:  01/30/2014  FINDINGS: Cardiac silhouette is mildly enlarged. The aorta is uncoiled. No mediastinal or hilar masses.  Right hemidiaphragm is mildly elevated, stable.  Lungs are clear. No pleural effusion. No pneumothorax. Left anterior chest wall sequential pacemaker is stable and well positioned.  Irregular areas of sclerosis are noted throughout the thorax consistent with blastic metastatic disease. This is stable.  IMPRESSION: No acute cardiopulmonary disease.   Electronically Signed   By: Lajean Manes M.D.   On: 03/06/2014 21:24     EKG Interpretation None      MDM   Final diagnoses:  None    KEVONTA PHARISS is a 80 y.o. male here with weakness. Likely worsening deconditioning. I think he needs rehab vs nursing home. Leg swelling likely from uncompliance with lasix. Will repeat BNP. Will repeat labs and UA and CXR for possible medical cause. Will call social work.   9:59 PM Unable to reach social work or case management during off hours. Labs stable. CXR unremarkable. BNP improved. Doesn't meet inpatient criteria. Will refer for outpatient home health eval with social work, aid, Marine scientist, and PT. I discussed with granddaughter, Arrie Aran, who will take patient home.   Wandra Arthurs, MD 03/06/14 2203

## 2014-03-06 NOTE — Discharge Instructions (Signed)
Home health will call you by Monday. A hospital bed is ordered.    He needs to take his medicines as prescribed.   Return to ER if he falls, severe pain, vomiting, fevers.

## 2014-03-06 NOTE — ED Notes (Addendum)
Per pt's granddaughter whom pt lives w/ pt has had multiple falls at home.  Pt is profoundly weak and is having dizzy spells.  Pt's granddaughter is also concerned about pt's right elbow wound from a previous fall.  Pt also has weeping to b/l LE.  Left foot is red and warm to touch and per granddaughter pt does c/o pain in left foot w/ ambulation.

## 2014-03-07 NOTE — Progress Notes (Signed)
CARE MANAGEMENT NOTE 03/07/2014  Patient:  PATRIC, BUCKHALTER   Account Number:  [id number]  Date Initiated:  03/07/2014  Documentation initiated by:  [facility]  Subjective/Objective Assessment:   multiple falls     Action/Plan:   lives with home with grand-dtr, Cathlean Sauer # [phone]   Anticipated DC Date:  03/06/2014   Anticipated DC Plan:  [place]  CM consult      [facility] Choice  HOME HEALTH   Choice offered to / List presented to:  C-4 Adult Children   DME arranged  HOSPITAL BED      DME agency  [place] arranged  HH-1 RN  [place]      [place].   Status of service:  Completed, signed off Medicare Important Message given?   (If response is "NO", the following Medicare IM given date fields will be blank) Date Medicare IM given:   Date Additional Medicare IM given:    Discharge Disposition:  [place]  Per UR Regulation:    If discussed at Long Length of Stay Meetings, dates discussed:    Comments:  03/07/2014 1115 NCM spoke to grand-dtr, [employer]. Offered choice for [facility] and requested [facility] for [facility]. States pt had [facility] in the past. States pt will need hospital bed. Notified [facility] for [facility] and hospital bed. Requested delivery on tomorrow, 03/08/2014. Faxed order to [facility] for hospital  bed. Jonnie Finner RN CCM Case Mgmt phone [phone]

## 2014-03-08 ENCOUNTER — Telehealth: Payer: Self-pay | Admitting: Family Medicine

## 2014-03-08 LAB — URINE CULTURE
COLONY COUNT: NO GROWTH
CULTURE: NO GROWTH

## 2014-03-08 NOTE — Telephone Encounter (Signed)
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 Blima Rich

## 2014-03-09 NOTE — Telephone Encounter (Signed)
Needs a glucometer also

## 2014-03-09 NOTE — Telephone Encounter (Signed)
Medication list faxed to [facility] with [facility].   [place]

## 2014-03-10 NOTE — Telephone Encounter (Signed)
Spoke with Peter [name] from [facility]. Patient does not require a glucometer as he is not an insulin dependent diabetic. [name] will relay the information to the patient.

## 2014-03-11 ENCOUNTER — Telehealth: Payer: Self-pay | Admitting: Family Medicine

## 2014-03-11 NOTE — Telephone Encounter (Signed)
Attempted phone call twice today and on hold over 10 mins each time.  [place]

## 2014-03-11 NOTE — Telephone Encounter (Signed)
Lee from [facility] calls wanting verbals orders for bedside commode and a walker. Please call [phone] ext [phone] with these orders. Wanting to deliver to patient this afternoon.

## 2014-03-12 NOTE — Telephone Encounter (Signed)
Was able to reach [facility] with [facility] and gave verbal orders for patient to have a bedside commode and a walker.  [place]

## 2014-03-21 ENCOUNTER — Inpatient Hospital Stay (HOSPITAL_COMMUNITY)
Admission: EM | Admit: 2014-03-21 | Discharge: 2014-05-05 | DRG: 640 | Disposition: E | Payer: Medicare Other | Attending: Family Medicine | Admitting: Family Medicine

## 2014-03-21 ENCOUNTER — Encounter (HOSPITAL_COMMUNITY): Payer: Self-pay | Admitting: Emergency Medicine

## 2014-03-21 ENCOUNTER — Emergency Department (HOSPITAL_COMMUNITY): Payer: Medicare Other

## 2014-03-21 DIAGNOSIS — E119 Type 2 diabetes mellitus without complications: Secondary | ICD-10-CM

## 2014-03-21 DIAGNOSIS — G931 Anoxic brain damage, not elsewhere classified: Secondary | ICD-10-CM | POA: Diagnosis not present

## 2014-03-21 DIAGNOSIS — Z79899 Other long term (current) drug therapy: Secondary | ICD-10-CM

## 2014-03-21 DIAGNOSIS — R570 Cardiogenic shock: Secondary | ICD-10-CM | POA: Diagnosis not present

## 2014-03-21 DIAGNOSIS — G4733 Obstructive sleep apnea (adult) (pediatric): Secondary | ICD-10-CM | POA: Diagnosis present

## 2014-03-21 DIAGNOSIS — N179 Acute kidney failure, unspecified: Secondary | ICD-10-CM | POA: Diagnosis not present

## 2014-03-21 DIAGNOSIS — G9340 Encephalopathy, unspecified: Secondary | ICD-10-CM

## 2014-03-21 DIAGNOSIS — Z87891 Personal history of nicotine dependence: Secondary | ICD-10-CM

## 2014-03-21 DIAGNOSIS — R1319 Other dysphagia: Secondary | ICD-10-CM | POA: Diagnosis present

## 2014-03-21 DIAGNOSIS — Z85828 Personal history of other malignant neoplasm of skin: Secondary | ICD-10-CM

## 2014-03-21 DIAGNOSIS — R600 Localized edema: Secondary | ICD-10-CM

## 2014-03-21 DIAGNOSIS — Y92009 Unspecified place in unspecified non-institutional (private) residence as the place of occurrence of the external cause: Secondary | ICD-10-CM

## 2014-03-21 DIAGNOSIS — J9600 Acute respiratory failure, unspecified whether with hypoxia or hypercapnia: Secondary | ICD-10-CM | POA: Diagnosis not present

## 2014-03-21 DIAGNOSIS — C61 Malignant neoplasm of prostate: Secondary | ICD-10-CM | POA: Diagnosis present

## 2014-03-21 DIAGNOSIS — E785 Hyperlipidemia, unspecified: Secondary | ICD-10-CM | POA: Diagnosis present

## 2014-03-21 DIAGNOSIS — R531 Weakness: Secondary | ICD-10-CM

## 2014-03-21 DIAGNOSIS — D649 Anemia, unspecified: Secondary | ICD-10-CM

## 2014-03-21 DIAGNOSIS — Z9181 History of falling: Secondary | ICD-10-CM

## 2014-03-21 DIAGNOSIS — E860 Dehydration: Secondary | ICD-10-CM | POA: Diagnosis present

## 2014-03-21 DIAGNOSIS — Z66 Do not resuscitate: Secondary | ICD-10-CM

## 2014-03-21 DIAGNOSIS — H9190 Unspecified hearing loss, unspecified ear: Secondary | ICD-10-CM | POA: Diagnosis present

## 2014-03-21 DIAGNOSIS — C7951 Secondary malignant neoplasm of bone: Secondary | ICD-10-CM | POA: Diagnosis present

## 2014-03-21 DIAGNOSIS — Z6838 Body mass index (BMI) 38.0-38.9, adult: Secondary | ICD-10-CM

## 2014-03-21 DIAGNOSIS — IMO0002 Reserved for concepts with insufficient information to code with codable children: Secondary | ICD-10-CM

## 2014-03-21 DIAGNOSIS — I469 Cardiac arrest, cause unspecified: Secondary | ICD-10-CM | POA: Diagnosis not present

## 2014-03-21 DIAGNOSIS — R55 Syncope and collapse: Secondary | ICD-10-CM | POA: Diagnosis present

## 2014-03-21 DIAGNOSIS — J189 Pneumonia, unspecified organism: Secondary | ICD-10-CM | POA: Diagnosis not present

## 2014-03-21 DIAGNOSIS — E669 Obesity, unspecified: Secondary | ICD-10-CM | POA: Diagnosis present

## 2014-03-21 DIAGNOSIS — R5381 Other malaise: Secondary | ICD-10-CM | POA: Diagnosis present

## 2014-03-21 DIAGNOSIS — Z8546 Personal history of malignant neoplasm of prostate: Secondary | ICD-10-CM

## 2014-03-21 DIAGNOSIS — E46 Unspecified protein-calorie malnutrition: Secondary | ICD-10-CM | POA: Diagnosis present

## 2014-03-21 DIAGNOSIS — I10 Essential (primary) hypertension: Secondary | ICD-10-CM | POA: Diagnosis present

## 2014-03-21 DIAGNOSIS — M19019 Primary osteoarthritis, unspecified shoulder: Secondary | ICD-10-CM | POA: Diagnosis present

## 2014-03-21 DIAGNOSIS — C7952 Secondary malignant neoplasm of bone marrow: Secondary | ICD-10-CM

## 2014-03-21 DIAGNOSIS — R42 Dizziness and giddiness: Secondary | ICD-10-CM

## 2014-03-21 DIAGNOSIS — C4441 Basal cell carcinoma of skin of scalp and neck: Secondary | ICD-10-CM | POA: Diagnosis present

## 2014-03-21 DIAGNOSIS — R6889 Other general symptoms and signs: Secondary | ICD-10-CM | POA: Diagnosis present

## 2014-03-21 DIAGNOSIS — R4020 Unspecified coma: Secondary | ICD-10-CM | POA: Diagnosis not present

## 2014-03-21 DIAGNOSIS — G4730 Sleep apnea, unspecified: Secondary | ICD-10-CM | POA: Diagnosis present

## 2014-03-21 DIAGNOSIS — Z950 Presence of cardiac pacemaker: Secondary | ICD-10-CM | POA: Diagnosis present

## 2014-03-21 DIAGNOSIS — D696 Thrombocytopenia, unspecified: Secondary | ICD-10-CM | POA: Diagnosis not present

## 2014-03-21 DIAGNOSIS — I2510 Atherosclerotic heart disease of native coronary artery without angina pectoris: Secondary | ICD-10-CM | POA: Diagnosis present

## 2014-03-21 DIAGNOSIS — F3289 Other specified depressive episodes: Secondary | ICD-10-CM | POA: Diagnosis present

## 2014-03-21 DIAGNOSIS — R404 Transient alteration of awareness: Secondary | ICD-10-CM | POA: Diagnosis not present

## 2014-03-21 DIAGNOSIS — E870 Hyperosmolality and hypernatremia: Principal | ICD-10-CM | POA: Diagnosis not present

## 2014-03-21 DIAGNOSIS — E876 Hypokalemia: Secondary | ICD-10-CM | POA: Diagnosis not present

## 2014-03-21 DIAGNOSIS — D630 Anemia in neoplastic disease: Secondary | ICD-10-CM | POA: Diagnosis present

## 2014-03-21 DIAGNOSIS — S43429A Sprain of unspecified rotator cuff capsule, initial encounter: Secondary | ICD-10-CM | POA: Diagnosis present

## 2014-03-21 DIAGNOSIS — Z515 Encounter for palliative care: Secondary | ICD-10-CM

## 2014-03-21 DIAGNOSIS — R32 Unspecified urinary incontinence: Secondary | ICD-10-CM | POA: Diagnosis not present

## 2014-03-21 DIAGNOSIS — F329 Major depressive disorder, single episode, unspecified: Secondary | ICD-10-CM | POA: Diagnosis present

## 2014-03-21 DIAGNOSIS — W19XXXA Unspecified fall, initial encounter: Secondary | ICD-10-CM | POA: Diagnosis present

## 2014-03-21 LAB — URINALYSIS, ROUTINE W REFLEX MICROSCOPIC
Glucose, UA: NEGATIVE mg/dL
Hgb urine dipstick: NEGATIVE
KETONES UR: 40 mg/dL — AB
LEUKOCYTES UA: NEGATIVE
Nitrite: NEGATIVE
PROTEIN: NEGATIVE mg/dL
Specific Gravity, Urine: 1.025 (ref 1.005–1.030)
UROBILINOGEN UA: 1 mg/dL (ref 0.0–1.0)
pH: 5.5 (ref 5.0–8.0)

## 2014-03-21 LAB — CBC WITH DIFFERENTIAL/PLATELET
BASOS ABS: 0 10*3/uL (ref 0.0–0.1)
Basophils Relative: 0 % (ref 0–1)
EOS ABS: 0.1 10*3/uL (ref 0.0–0.7)
EOS PCT: 2 % (ref 0–5)
HEMATOCRIT: 30 % — AB (ref 39.0–52.0)
Hemoglobin: 10.2 g/dL — ABNORMAL LOW (ref 13.0–17.0)
LYMPHS PCT: 11 % — AB (ref 12–46)
Lymphs Abs: 0.6 10*3/uL — ABNORMAL LOW (ref 0.7–4.0)
MCH: 31.4 pg (ref 26.0–34.0)
MCHC: 34 g/dL (ref 30.0–36.0)
MCV: 92.3 fL (ref 78.0–100.0)
Monocytes Absolute: 0.4 10*3/uL (ref 0.1–1.0)
Monocytes Relative: 6 % (ref 3–12)
Neutro Abs: 5 10*3/uL (ref 1.7–7.7)
Neutrophils Relative %: 81 % — ABNORMAL HIGH (ref 43–77)
Platelets: 182 10*3/uL (ref 150–400)
RBC: 3.25 MIL/uL — ABNORMAL LOW (ref 4.22–5.81)
RDW: 13.8 % (ref 11.5–15.5)
WBC: 6.1 10*3/uL (ref 4.0–10.5)

## 2014-03-21 LAB — I-STAT CG4 LACTIC ACID, ED: Lactic Acid, Venous: 1.57 mmol/L (ref 0.5–2.2)

## 2014-03-21 LAB — COMPREHENSIVE METABOLIC PANEL
ALT: 17 U/L (ref 0–53)
AST: 47 U/L — ABNORMAL HIGH (ref 0–37)
Albumin: 3 g/dL — ABNORMAL LOW (ref 3.5–5.2)
Alkaline Phosphatase: 191 U/L — ABNORMAL HIGH (ref 39–117)
BUN: 13 mg/dL (ref 6–23)
CALCIUM: 9.1 mg/dL (ref 8.4–10.5)
CO2: 26 mEq/L (ref 19–32)
Chloride: 101 mEq/L (ref 96–112)
Creatinine, Ser: 0.76 mg/dL (ref 0.50–1.35)
GFR, EST AFRICAN AMERICAN: 89 mL/min — AB (ref >90)
GFR, EST NON AFRICAN AMERICAN: 77 mL/min — AB (ref >90)
GLUCOSE: 113 mg/dL — AB (ref 70–99)
Potassium: 3.7 mEq/L (ref 3.7–5.3)
Sodium: 139 mEq/L (ref 137–147)
TOTAL PROTEIN: 6.9 g/dL (ref 6.0–8.3)
Total Bilirubin: 1.5 mg/dL — ABNORMAL HIGH (ref 0.3–1.2)

## 2014-03-21 LAB — CK: Total CK: 572 U/L — ABNORMAL HIGH (ref 7–232)

## 2014-03-21 LAB — TSH: TSH: 2.92 u[IU]/mL (ref 0.350–4.500)

## 2014-03-21 LAB — CBG MONITORING, ED: GLUCOSE-CAPILLARY: 108 mg/dL — AB (ref 70–99)

## 2014-03-21 LAB — TROPONIN I

## 2014-03-21 MED ORDER — INSULIN ASPART 100 UNIT/ML ~~LOC~~ SOLN
0.0000 [IU] | Freq: Three times a day (TID) | SUBCUTANEOUS | Status: DC
  Administered 2014-03-23 – 2014-03-25 (×4): 1 [IU] via SUBCUTANEOUS

## 2014-03-21 MED ORDER — BOOST HIGH PROTEIN PO LIQD
1.0000 | ORAL | Status: DC
  Administered 2014-03-22 – 2014-03-24 (×3): 1 via ORAL
  Filled 2014-03-21 (×4): qty 1

## 2014-03-21 MED ORDER — ABIRATERONE ACETATE 250 MG PO TABS: 500.0000 mg | ORAL_TABLET | Freq: Every day | ORAL | Status: DC

## 2014-03-21 MED ORDER — TRAMADOL HCL 50 MG PO TABS
50.0000 mg | ORAL_TABLET | Freq: Four times a day (QID) | ORAL | Status: DC | PRN
  Administered 2014-03-21: 50 mg via ORAL
  Filled 2014-03-21: qty 1

## 2014-03-21 MED ORDER — PREDNISONE 5 MG PO TABS
5.0000 mg | ORAL_TABLET | Freq: Two times a day (BID) | ORAL | Status: DC
  Administered 2014-03-22 – 2014-03-25 (×5): 5 mg via ORAL
  Filled 2014-03-21 (×11): qty 1

## 2014-03-21 MED ORDER — SODIUM CHLORIDE 0.9 % IV BOLUS (SEPSIS)
1000.0000 mL | Freq: Once | INTRAVENOUS | Status: AC
  Administered 2014-03-21: 1000 mL via INTRAVENOUS

## 2014-03-21 MED ORDER — SODIUM CHLORIDE 0.9 % IV SOLN
INTRAVENOUS | Status: DC
  Administered 2014-03-21 – 2014-03-27 (×8): via INTRAVENOUS

## 2014-03-21 MED ORDER — CALCIUM CARBONATE-VITAMIN D 500-200 MG-UNIT PO TABS
1.0000 | ORAL_TABLET | Freq: Every day | ORAL | Status: DC
  Administered 2014-03-22 – 2014-04-08 (×17): 1 via ORAL
  Filled 2014-03-21 (×21): qty 1

## 2014-03-21 MED ORDER — HEPARIN SODIUM (PORCINE) 5000 UNIT/ML IJ SOLN
5000.0000 [IU] | Freq: Three times a day (TID) | INTRAMUSCULAR | Status: DC
  Administered 2014-03-21 – 2014-03-25 (×11): 5000 [IU] via SUBCUTANEOUS
  Filled 2014-03-21 (×17): qty 1

## 2014-03-21 MED ORDER — SODIUM CHLORIDE 0.9 % IV SOLN
INTRAVENOUS | Status: AC
  Administered 2014-03-21: 15:00:00 via INTRAVENOUS

## 2014-03-21 MED ORDER — SODIUM CHLORIDE 0.9 % IJ SOLN
3.0000 mL | Freq: Two times a day (BID) | INTRAMUSCULAR | Status: DC
  Administered 2014-03-26 – 2014-03-28 (×4): 3 mL via INTRAVENOUS
  Administered 2014-03-29: 12 mL via INTRAVENOUS
  Administered 2014-03-29 – 2014-03-30 (×2): 3 mL via INTRAVENOUS
  Administered 2014-03-30: 6 mL via INTRAVENOUS
  Administered 2014-03-31: 3 mL via INTRAVENOUS
  Administered 2014-04-01: 10 mL via INTRAVENOUS
  Administered 2014-04-01 – 2014-04-02 (×2): 3 mL via INTRAVENOUS

## 2014-03-21 NOTE — H&P (Signed)
[facility] Admission History and Physical Service Pager: [phone]  Patient name: Peter [name] Medical record number: [id number] Date of birth: 14-Oct-1919 Age: 80 y.o. Gender: male  Primary Care Provider: Lupita [name], MD Consultants: None  Code Status: Full   Chief Complaint: Weakness, fall   Assessment and Plan: Peter [name] is a 80 y.o. male presenting with weakness and fall . PMH is significant for prostate Ca and skin Ca, DM, HTN, HLD, Anemia, and Sleep Apnea   # Fall, possible presyncope - CK up, but does not meet dx for rhabdo in absence of UA findings or myalgias.  Will w/u for syncopal cause and does have pacemaker   - Telemetry, vitals per unit - F/U AM CK, CMET, CBC, TSH  - Troponin negative x 1 and denies CP.  EKG unchanged, repeat in AM - Will obtain Echo to r/o CHF or valvular cause - PT/OT/CSW for possible placement  # Hx of CAD and arrhythmia, pacemaker in place - Continue home medications   #Hx of Prostate CA w/ mets to the bone - managed by Heme/Onc, appreciate recs.  Currently on Zytiga but considering chemo   # Anemia of chronic disease - Stable, 2/2 CA most likely - Monitor daily CBC   # Hx of Hyperlipidemia - Not indicated for his age  # DM II  - last A1C about one yr ago, will repeat - Sensitive SSI while here, hold metformin   # HTN Hx - Not on any medications for this at home, monitor closely    FEN/GI: Regular Diet, NS @ 100 cc/hr Prophylaxis: SQ heparin  Disposition: Pending further w/u and evaluation   History of Present Illness: Peter [name] is a 80 y.o. male presenting with fall which he states he was on the ground for the last 4 days, unable to get up.  Pt recently moved out of his daughters house due to the fact that he was walking around naked constantly and did not want to listen to his daughter about putting on clothes.  He has now been living on his own for the recent time and states  that about 3-4 days ago he went to try and place his hand on a device to stabilize himself, but was unable to do so, falling to the ground.  He attempted to get himself up, but was unable to perform this and has been on the ground since then until found today and 911 called.  He denies any CP, SOB, abdominal pain, fever, chills, or sweats, dizziness, extremity pain, hitting his head or HA/blurred vision/diplopia.  Does endorse weakness.  In the ED, multiple evaluations performed including CT head not showing acute process, X-ray of chest/lumbar/thoracic showing osteosclerosis but no new processes, CMET showing basically normal values, CK elevated to 572, troponin negative, LA of 1.57, no leukocytosis, UA w/o LE.  He was started on fluids and called for admission.  Denies current smoking, alcohol, illicit drug use.  No recent travel.   Review Of Systems: Per HPI  Patient Active Problem List   Diagnosis Date Noted  . Fluid retention in legs 01/15/2014  . Need for pneumococcal vaccination 01/15/2014  . Needs flu shot 07/27/2013  . Weakness generalized 03/23/2013  . Encounter for palliative care 03/23/2013  . Squamous cell carcinoma of skin of upper arm 11/13/2012  . Hearing loss 11/13/2012  . Healthcare maintenance 11/13/2012  . Epithelial inclusion cyst 10/16/2011  . Cataracts, both eyes 03/21/2011  . DIABETES  MELLITUS 12/07/2010  . BASAL CELL CARCINOMA OF SCALP AND SKIN OF NECK 09/26/2010  . ANEMIA 05/02/2010  . PACEMAKER, PERMANENT 09/13/2009  . Hypertrophy of breast 02/07/2009  . Forgetfulness 07/01/2008  . PERIUMBILICAL HERNIA 78/29/5621  . DECREASED HEARING, RIGHT EAR 05/27/2007  . Prostate cancer metastatic to multiple sites 01/02/2007  . HYPERLIPIDEMIA 01/02/2007  . OBESITY, NOS 01/02/2007  . MACULAR DEGENERATION 01/02/2007  . VENOUS INSUFFICIENCY, CHRONIC 01/02/2007  . HYPERTROPHY PROSTATE W/O UR OBST & OTH LUTS 01/02/2007  . PSORIASIS 01/02/2007  . OSTEOARTHRITIS OF SPINE,  NOS 01/02/2007  . APNEA, SLEEP 01/02/2007   Past Medical History: Past Medical History  Diagnosis Date  . Cardiac septal defect     closed self during childhood  . Blood transfusion     colon polypectomy, bleeding requiring transfusion  . Hx of colonic polyps 03/03/2005    adenomatous  . Obstructive sleep apnea     Does not use CPAP  . Anemia   . Diabetes mellitus   . Diverticulosis of colon   . GERD without esophagitis   . Hyperlipidemia   . Macular degeneration   . Memory loss of   . Osteoarthritis of spine   . Periumbilical hernia   . Psoriasis   . Chronic venous insufficiency   . AV block, complete   . Mobitz (type) II atrioventricular block   . Obesity, unspecified   . Left bundle branch hemiblock   . Pure hypercholesterolemia   . Cardiac pacemaker in situ 01/18/05    1st one inserted 01/18/05. 2nd one inserted 06/06/10.  Marland Kitchen LBBB (left bundle branch block)   . Lower extremity edema     Chronic   . Fluid retention in legs 01/15/2014  . Need for pneumococcal vaccination 01/15/2014  . Prostate cancer     Urologist watchfully waiting   Past Surgical History: Past Surgical History  Procedure Laterality Date  . Tonsillectomy      Age 67  . Partial colectomy  07/09/02    For Polyps  . Pacemaker placement  01/18/05  . 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 Cataract extraction  6/12    left eye Dr Katy Fitch  . Pacemaker replacement  06/06/10    Dr Leonia Reeves   Social History: History  Substance Use Topics  . Smoking status: Former Smoker    Types: Pipe  . Smokeless tobacco: Former Systems developer  . Alcohol Use: Yes     Comment: 1 Beer Daily    Family History: Family History  Problem Relation Age of Onset  . Dementia Mother     Died at 34  . CAD Mother   . Cancer Father   . Heart murmur Father   . Cancer Paternal Grandmother   . Kidney failure Maternal Uncle   . Alzheimer's disease Daughter   . COPD Daughter   . Cancer Brother    Allergies and Medications: No  Known Allergies No current facility-administered medications on file prior to encounter.   Current Outpatient Prescriptions on File Prior to Encounter  Medication Sig Dispense Refill  . abiraterone Acetate (ZYTIGA) 250 MG tablet Take 2 tablets (500 mg total) by mouth daily. Take on an empty stomach 1 hour before or 2 hours after a meal  60 tablet  1  . calcium-vitamin D (OSCAL WITH D) 500-200 MG-UNIT per tablet Take 1 tablet by mouth daily with breakfast.      . Coenzyme Q10 (CO Q 10) 100 MG CAPS Take 200 mg  by mouth daily.       . feeding supplement (BOOST HIGH PROTEIN) LIQD Take 1 Container by mouth every morning.      . furosemide (LASIX) 20 MG tablet Take 1 tablet (20 mg total) by mouth daily.  30 tablet  1  . metFORMIN (GLUCOPHAGE-XR) 500 MG 24 hr tablet Take 1 tablet (500 mg total) by mouth daily with breakfast.  90 tablet  1  . Multiple Vitamin (MULTIVITAMIN) tablet Take 1 tablet by mouth daily.      . predniSONE (DELTASONE) 5 MG tablet Take 5 mg by mouth 2 (two) times daily with a meal.      . traMADol (ULTRAM) 50 MG tablet Take 50 mg by mouth every 6 (six) hours as needed for moderate pain.      . Vitamin D, Cholecalciferol, 1000 UNITS TABS Take 1,000 Units by mouth daily.        Objective: BP 128/98  Pulse 110  Resp 19  SpO2 98% Exam: General: NAD, comfortable in bed HEENT: Metter/AT, dry mucous membranes, EOMI B/L, PERRLA Cardiovascular: RRR, no murmurs appreciated  Respiratory: CTAB Abdomen: NABS, Soft/NT/ND, + ventral hernia, reducible Extremities: +1 B/L Edema, +2 pulses B/L LE  Skin: No rashes, + scattered ecchymosis  Neuro: AAO x 3, no focal deficits   Labs and Imaging: CBC BMET   Recent Labs Lab 03/19/2014 1135  WBC 6.1  HGB 10.2*  HCT 30.0*  PLT 182   Lab Results  Component Value Date   CKTOTAL 572* 03/09/2014   TROPONINI <0.30 03/15/2014    Recent Labs Lab 03/30/2014 1135  NA 139  K 3.7  CL 101  CO2 26  BUN 13  CREATININE 0.76  GLUCOSE 113*  CALCIUM  9.1     CT head - no acute process  X- ray, Thorax - Pacer leads present, no new fx  X-ray Chest - Osseous sclerotic markings but no acute process  X-ray Lumbar - Sclerotic process, no acute findings or fx  EKG - Pacer spike present, unchanged from previous     Nolon Rod, DO 03/30/2014, 2:43 PM PGY 2, [place] Intern pager: [phone], text pages welcome

## 2014-03-21 NOTE — ED Notes (Signed)
Patient states that he had a dizzy spell and fell about 3 days ago. The patent has garbled speech and complaining of pain to his back with palaption. He has a bruise noted to his right hip.

## 2014-03-21 NOTE — ED Provider Notes (Signed)
CSN: [id number]     Arrival date & time 03/16/2014  1120 History   First MD Initiated Contact with Patient 04/02/2014 1125     Chief Complaint  Patient presents with  . Fall  . Back Pain     (Consider location/radiation/quality/duration/timing/severity/associated sxs/prior Treatment) HPI 80 year old male brought in by EMS after a fall about 3 days ago. Patient states that he was standing up holding onto something and felt dizziness and he then fell. He does not think he hit his head. He's been lying on the floor for the last 3 days because his been unable to get up. He states that he has diffuse weakness and was unable to get up. He was unable to call anybody. He is not complaining of any specific pain. This includes no chest pain or abdominal pain. No back pain. He's been unable to eat or drink since the lying on the ground.  Past Medical History  Diagnosis Date  . Cardiac septal defect     closed self during childhood  . Blood transfusion     colon polypectomy, bleeding requiring transfusion  . Hx of colonic polyps 03/03/2005    adenomatous  . Obstructive sleep apnea     Does not use CPAP  . Anemia   . Diabetes mellitus   . Diverticulosis of colon   . GERD without esophagitis   . Hyperlipidemia   . Macular degeneration   . Memory loss of   . Osteoarthritis of spine   . Periumbilical hernia   . Psoriasis   . Chronic venous insufficiency   . AV block, complete   . Mobitz (type) II atrioventricular block   . Obesity, unspecified   . Left bundle branch hemiblock   . Pure hypercholesterolemia   . Cardiac pacemaker in situ 01/18/05    1st one inserted 01/18/05. 2nd one inserted 06/06/10.  Marland Kitchen LBBB (left bundle branch block)   . Lower extremity edema     Chronic   . Fluid retention in legs 01/15/2014  . Need for pneumococcal vaccination 01/15/2014  . Prostate cancer     Urologist watchfully waiting   Past Surgical History  Procedure Laterality Date  . Tonsillectomy      Age 70  .  Partial colectomy  07/09/02    For Polyps  . Pacemaker placement  01/18/05  . 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 Cataract extraction  6/12    left eye Dr Katy Fitch  . Pacemaker replacement  06/06/10    Dr Leonia Reeves   Family History  Problem Relation Age of Onset  . Dementia Mother     Died at 88  . CAD Mother   . Cancer Father   . Heart murmur Father   . Cancer Paternal Grandmother   . Kidney failure Maternal Uncle   . Alzheimer's disease Daughter   . COPD Daughter   . Cancer Brother    History  Substance Use Topics  . Smoking status: Former Smoker    Types: Pipe  . Smokeless tobacco: Former Systems developer  . Alcohol Use: Yes     Comment: 1 Beer Daily    Review of Systems  HENT: Positive for sore throat.   Respiratory: Negative for shortness of breath.   Cardiovascular: Negative for chest pain.  Gastrointestinal: Negative for vomiting and abdominal pain.  Musculoskeletal: Negative for back pain and neck pain.  Neurological: Positive for dizziness. Negative for headaches.  All other systems reviewed and are negative.  Allergies  Review of patient's allergies indicates no known allergies.  Home Medications   Prior to Admission medications   Medication Sig Start Date End Date Taking? Authorizing Provider  abiraterone Acetate (ZYTIGA) 250 MG tablet Take 2 tablets (500 mg total) by mouth daily. Take on an empty stomach 1 hour before or 2 hours after a meal 01/18/14   Heath Lark, MD  calcium-vitamin D (OSCAL WITH D) 500-200 MG-UNIT per tablet Take 1 tablet by mouth daily with breakfast.    Historical Provider, MD  Coenzyme Q10 (CO Q 10) 100 MG CAPS Take 200 mg by mouth daily.     Historical Provider, MD  feeding supplement (BOOST HIGH PROTEIN) LIQD Take 1 Container by mouth every morning.    Historical Provider, MD  furosemide (LASIX) 20 MG tablet Take 1 tablet (20 mg total) by mouth daily. 01/15/14   Heath Lark, MD  metFORMIN (GLUCOPHAGE-XR) 500 MG 24 hr tablet Take 1  tablet (500 mg total) by mouth daily with breakfast. 08/21/13   Lupita [name], MD  Multiple Vitamin (MULTIVITAMIN) tablet Take 1 tablet by mouth daily.    Historical Provider, MD  predniSONE (DELTASONE) 5 MG tablet Take 5 mg by mouth 2 (two) times daily with a meal.    Historical Provider, MD  traMADol (ULTRAM) 50 MG tablet Take 50 mg by mouth every 6 (six) hours as needed for moderate pain.    Historical Provider, MD  Vitamin D, Cholecalciferol, 1000 UNITS TABS Take 1,000 Units by mouth daily. 01/19/14   Historical Provider, MD   BP 126/65  Pulse 100  Resp 19  SpO2 99% Physical Exam  Nursing note and vitals reviewed. Constitutional: He is oriented to person, place, and time. He appears well-developed and well-nourished.  HENT:  Head: Normocephalic and atraumatic.  Right Ear: External ear normal.  Left Ear: External ear normal.  Nose: Nose normal.  Dry lips/mucous membranes  Eyes: Right eye exhibits no discharge. Left eye exhibits no discharge.  Neck: Neck supple. No spinous process tenderness and no muscular tenderness present.  Cardiovascular: Normal rate, regular rhythm, normal heart sounds and intact distal pulses.   Pulmonary/Chest: Effort normal and breath sounds normal.  Abdominal: Soft. He exhibits no distension. There is no tenderness. A hernia is present. Hernia confirmed positive in the ventral area (non-obstructed, able to easily be reduced).  Musculoskeletal:       Cervical back: He exhibits no tenderness.       Thoracic back: He exhibits tenderness and bony tenderness.       Lumbar back: He exhibits tenderness and bony tenderness.  Erythema to lower back/sacrum and upper left back in mid-thoracic back consistent with him having laid on his back for a couple days.  Neurological: He is alert and oriented to person, place, and time.  Oriented to month and year and knows his birthday is in a few days. Unsure of exact day of the week or what day in May it is. Able to lift  bilateral lower extremities off bed equally and without difficulty  Skin: Skin is warm and dry.    ED Course  Procedures (including critical care time) Labs Review Labs Reviewed  CBC WITH DIFFERENTIAL - Abnormal; Notable for the following:    RBC 3.25 (*)    Hemoglobin 10.2 (*)    HCT 30.0 (*)    Neutrophils Relative % 81 (*)    Lymphocytes Relative 11 (*)    Lymphs Abs 0.6 (*)    All other  components within normal limits  COMPREHENSIVE METABOLIC PANEL - Abnormal; Notable for the following:    Glucose, Bld 113 (*)    Albumin 3.0 (*)    AST 47 (*)    Alkaline Phosphatase 191 (*)    Total Bilirubin 1.5 (*)    GFR calc non Af Amer 77 (*)    GFR calc Af Amer 89 (*)    All other components within normal limits  URINALYSIS, ROUTINE W REFLEX MICROSCOPIC - Abnormal; Notable for the following:    Color, Urine AMBER (*)    APPearance CLOUDY (*)    Bilirubin Urine SMALL (*)    Ketones, ur 40 (*)    All other components within normal limits  CK - Abnormal; Notable for the following:    Total CK 572 (*)    All other components within normal limits  CBG MONITORING, ED - Abnormal; Notable for the following:    Glucose-Capillary 108 (*)    All other components within normal limits  TROPONIN I  I-STAT CG4 LACTIC ACID, ED    Imaging Review Dg Chest 2 View  03/28/2014   CLINICAL DATA:  Cough  EXAM: CHEST  2 VIEW  COMPARISON:  DG THORACIC SPINE dated 03/24/2014; DG CHEST 2 VIEW dated 01/30/2014; DG CHEST 2 VIEW dated 03/06/2014  FINDINGS: Heart size is normal. Lungs are hypoaerated with crowding of the bronchovascular markings. Left-sided pacer in place. Inhomogeneous sclerosis of the spine is reidentified which may correspond to previously demonstrated metastatic lesions. Sclerosis of the clavicles and proximal right humerus is reidentified.  IMPRESSION: Lungs are hypoaerated with crowding of the bronchovascular markings. Osseous sclerotic metastatic disease reidentified.   Electronically Signed    By: Conchita Paris M.D.   On: 03/13/2014 12:39   Dg Thoracic Spine 2 View  03/20/2014   CLINICAL DATA:  Cough, patient found down, ecchymosis over the back. Per review of prior radiology reports, the patient has a history of prostate cancer metastatic to bone.  EXAM: THORACIC SPINE - 2 VIEW  COMPARISON:  DG CHEST 2 VIEW dated 03/06/2014; NM BONE WHOLE BODY dated 03/24/2013  FINDINGS: Pacer leads partly visualized. No new thoracic vertebral body compression identified. Presumed splenic arterial calcification reidentified over the left upper quadrant. The bones are inhomogeneous.  IMPRESSION: No vertebral compression or acute osseous abnormality identified.   Electronically Signed   By: Conchita Paris M.D.   On: 03/28/2014 12:37   Dg Lumbar Spine Complete  03/25/2014   CLINICAL DATA:  Fall, known metastatic osseous disease from primary prostate carcinoma per review of previous radiology reports.  EXAM: LUMBAR SPINE - COMPLETE 4+ VIEW  COMPARISON:  NM BONE WHOLE BODY dated 03/24/2013  FINDINGS: Sclerosis is noted throughout the bony pelvis, proximal femurs, and is less well visualized in the spine and ribs compared to dissimilar prior exam. Alignment is normal. No vertebral body compression deformity. 5 non rib-bearing lumbar type vertebral bodies are identified. Disc degenerative change noted at L3-L4 and L5-S1.  IMPRESSION: Sclerotic osseous lesions reidentified without acute osseous abnormality.   Electronically Signed   By: Conchita Paris M.D.   On: 03/26/2014 12:41   Ct Head Wo Contrast  03/10/2014   CLINICAL DATA:  Dizziness post trauma  EXAM: CT HEAD WITHOUT CONTRAST  TECHNIQUE: Contiguous axial images were obtained from the base of the skull through the vertex without intravenous contrast.  COMPARISON:  December 25, 2012  FINDINGS: There is moderate diffuse atrophy, stable. There is no mass, hemorrhage, extra-axial fluid collection,  or midline shift. There is small vessel disease throughout the centra  semiovale bilaterally, stable. There is no new gray-white compartment lesion. There is no demonstrable acute infarct. Bony calvarium appears intact. The mastoid air cells are clear. There is bilateral ethmoid and maxillary sinus disease. Cataracts are noted bilaterally. Subcutaneous edema is noted over both temporal regions.  IMPRESSION: Stable atrophy with supratentorial small vessel disease. There is no intracranial mass, hemorrhage, or acute appearing infarct. Paranasal sinus disease is noted in both maxillary antra and ethmoid air cell complexes. Subcutaneous soft tissue edema is noted over both temporal regions.   Electronically Signed   By: Lowella Grip M.D.   On: 03/14/2014 12:23     EKG Interpretation   Date/Time:  Sunday Mar 21 2014 11:47:09 EDT Ventricular Rate:  97 PR Interval:  177 QRS Duration: 160 QT Interval:  425 QTC Calculation: 540 R Axis:   -71 Text Interpretation:  Ventricular-paced rhythm Left bundle branch block  \E\ Confirmed by Petrice Beedy  MD, Zani Kyllonen ([id number]) on 03/12/2014 12:01:55 PM      MDM   Final diagnoses:  Dizziness  Dehydration  Fall    After discussion with the patient's granddaughter, she relates that the patient is unfit to live in their house and knows was living alone. However as this is been living alone he fell and was unable to get up. He was then found a couple days later which led to this current ER visit. Patient appears in no significant injuries, but he does appear dehydrated although his labs are somewhat reassuring. I feel he'll need IV fluids and is not fit to go home to live by himself. He will likely need placement in this admission. He is acting at his normal baseline per family at bedside. No other acute injury or disease. He has frequent falls and is thus not safe at home by himself.    Ephraim Hamburger, MD 04/04/2014 [phone]

## 2014-03-21 NOTE — ED Notes (Signed)
Per EMS, pt from home. Family unable to reach patient for 3 days.  Forced entry in order to get into home.  Pt found on floor.  States he hasn't eaten or drink for 4 days.  C/O thoracic region back pain.  Pt on LSB board.  No cervical pain.  Pt alert and oriented. Foul odor of urine on patient.  Vitals: 130 palpated, hr 90, CBG 136, resp 18.  Pt has pacemaker.  Unable to obtain IV in route.

## 2014-03-21 NOTE — ED Notes (Signed)
Patient will try to urine

## 2014-03-21 NOTE — ED Notes (Signed)
Patient is unable to urinate 

## 2014-03-21 NOTE — Progress Notes (Signed)
Patient has arrived to unit. MD notified.

## 2014-03-21 NOTE — ED Notes (Signed)
Cleaned patient up... From urine smell

## 2014-03-21 NOTE — ED Notes (Signed)
Patient is unable to urinate, wants to try again in 20

## 2014-03-21 NOTE — ED Notes (Signed)
Bed: FR10 Expected date:  Expected time:  Means of arrival:  Comments: 80 y/o M, fall 4 days ago, just found on floor today

## 2014-03-22 DIAGNOSIS — C61 Malignant neoplasm of prostate: Secondary | ICD-10-CM

## 2014-03-22 DIAGNOSIS — Z950 Presence of cardiac pacemaker: Secondary | ICD-10-CM

## 2014-03-22 DIAGNOSIS — R5381 Other malaise: Secondary | ICD-10-CM

## 2014-03-22 DIAGNOSIS — R5383 Other fatigue: Secondary | ICD-10-CM

## 2014-03-22 DIAGNOSIS — W19XXXA Unspecified fall, initial encounter: Secondary | ICD-10-CM

## 2014-03-22 DIAGNOSIS — C800 Disseminated malignant neoplasm, unspecified: Secondary | ICD-10-CM

## 2014-03-22 DIAGNOSIS — E119 Type 2 diabetes mellitus without complications: Secondary | ICD-10-CM

## 2014-03-22 DIAGNOSIS — E860 Dehydration: Secondary | ICD-10-CM

## 2014-03-22 LAB — CBC WITH DIFFERENTIAL/PLATELET
BASOS PCT: 0 % (ref 0–1)
Basophils Absolute: 0 10*3/uL (ref 0.0–0.1)
Eosinophils Absolute: 0.2 10*3/uL (ref 0.0–0.7)
Eosinophils Relative: 3 % (ref 0–5)
HCT: 24.5 % — ABNORMAL LOW (ref 39.0–52.0)
Hemoglobin: 8.1 g/dL — ABNORMAL LOW (ref 13.0–17.0)
LYMPHS ABS: 0.8 10*3/uL (ref 0.7–4.0)
Lymphocytes Relative: 17 % (ref 12–46)
MCH: 31.2 pg (ref 26.0–34.0)
MCHC: 33.1 g/dL (ref 30.0–36.0)
MCV: 94.2 fL (ref 78.0–100.0)
Monocytes Absolute: 0.4 10*3/uL (ref 0.1–1.0)
Monocytes Relative: 8 % (ref 3–12)
NEUTROS PCT: 72 % (ref 43–77)
Neutro Abs: 3.5 10*3/uL (ref 1.7–7.7)
Platelets: 154 10*3/uL (ref 150–400)
RBC: 2.6 MIL/uL — AB (ref 4.22–5.81)
RDW: 14.4 % (ref 11.5–15.5)
WBC: 4.9 10*3/uL (ref 4.0–10.5)

## 2014-03-22 LAB — GLUCOSE, CAPILLARY
GLUCOSE-CAPILLARY: 129 mg/dL — AB (ref 70–99)
Glucose-Capillary: 127 mg/dL — ABNORMAL HIGH (ref 70–99)
Glucose-Capillary: 137 mg/dL — ABNORMAL HIGH (ref 70–99)

## 2014-03-22 LAB — COMPREHENSIVE METABOLIC PANEL
ALBUMIN: 2.4 g/dL — AB (ref 3.5–5.2)
ALK PHOS: 148 U/L — AB (ref 39–117)
ALT: 15 U/L (ref 0–53)
AST: 46 U/L — AB (ref 0–37)
BUN: 11 mg/dL (ref 6–23)
CALCIUM: 8.2 mg/dL — AB (ref 8.4–10.5)
CO2: 21 mEq/L (ref 19–32)
Chloride: 107 mEq/L (ref 96–112)
Creatinine, Ser: 0.71 mg/dL (ref 0.50–1.35)
GFR calc Af Amer: >90 mL/min (ref >90)
GFR calc non Af Amer: 79 mL/min — ABNORMAL LOW (ref >90)
GLUCOSE: 106 mg/dL — AB (ref 70–99)
POTASSIUM: 3.4 meq/L — AB (ref 3.7–5.3)
SODIUM: 141 meq/L (ref 137–147)
TOTAL PROTEIN: 5.7 g/dL — AB (ref 6.0–8.3)
Total Bilirubin: 0.8 mg/dL (ref 0.3–1.2)

## 2014-03-22 LAB — HEMOGLOBIN A1C
Hgb A1c MFr Bld: 5.9 % — ABNORMAL HIGH (ref <5.7)
MEAN PLASMA GLUCOSE: 123 mg/dL — AB (ref <117)

## 2014-03-22 LAB — CK: CK TOTAL: 587 U/L — AB (ref 7–232)

## 2014-03-22 MED ORDER — POTASSIUM CHLORIDE CRYS ER 20 MEQ PO TBCR: 40.0000 meq | EXTENDED_RELEASE_TABLET | Freq: Once | ORAL | Status: DC

## 2014-03-22 NOTE — H&P (Signed)
[facility] Attending Admission Note: Annabell Sabal MD Personal pager:  [phone] FPTS Service Pager:  [phone]  I  have seen and examined this patient, reviewed their chart. I have discussed this patient with the resident. I agree with the resident's findings, assessment and care plan.  Additionally:  80 yo M with metastatic prostate CA, CAD with ventricular pacer, HTN, HLD, chronic anemia who presents after being found down for unclear period of time.  Patient had to secure his own housing after being asked to leave his granddaughter's house due to reportedly "walking around naked all the time."  Family not at bedside this AM.  Patient remembers falling and "was in despair" that no one would find him.  Too weak to rise on his own.  Thinks he fell on Thursday and unable to get up until found yesterday.  Denies any presyncopal/syncopal episodes.  No LOC.  No palpitations.  States he lost his balance and remembers entire time being on floor, denies hitting head.  This AM, my exam reveals an elderly male lying in hospital bed, somewhat disheveled appearing, in NAD.  Poor dentition.  Very dry oral mucosa.  Heart is regular rate.  Lungs with some coarse sounds at bases.  Abdomen benign.  Legs with post-edematous skin changes, but still with about +2 edema to mid-shins.  Some scratches on skin.  Speech is initally garbled sounding but in actuality is confluent, linear, and thought-directed.  Alert and oriented x 4, able to spell "world" backwards easily. No focal deficits on neuro exam.  Imp/Plan: 1.  Fall: - likely weakness from deconditioning - agree with PT - sounds like placement will be an issue as family cannot care for him.  I suspect this will be a difficult choice for him to make.   - Needs full MMSE.  Speech is garbled but understandable if one listens -- I have cared for Mr. Delangel previously, he is a very intelligent man and currently seems to have full capacity of his mental state.   - CK is  rising ever so slightly, continue IVF.  Surprisingly without AKI but likely due to lack of muscle mass  2.  Metastaic prostate CA: - PSA was >1300 but now trending down to 300.  Would touch base with oncology while in house  3. CAD and complete AV block:   - Accelerated ventricular rhythm with occasionally tachycardic pulse. - With cardiomegaly on CXR and by exam. - Unclear how long his rate has been this fast.  Would not be surprised for leg edema and bronchiolar edema to be evidence of heart failure.   - Hold on Echo for now.  If goals of care become an issue, heart failure diagnosis might help with family and patient making decisions.    4.  Anemia: - baseline seems to be about 8.  The 10.2 on admission likely hemoconcentration from dehydration.    Alveda Reasons, MD 03/22/2014 8:10 AM

## 2014-03-22 NOTE — Progress Notes (Signed)
OT Cancellation Note  Patient Details Name: Peter [name] MRN: [id number] DOB: December 05, 1918   Cancelled Treatment:    Reason Eval/Treat Not Completed: Other (comment)  Pt declines OT eval currently. He states he was up earlier with PT and he is sleepy and wants to rest. Encouraged him to do even a little OOB again with OT but pt refusing. He requests OT return tomorrow.   Alycia Patten Abigal Choung [phone] 03/22/2014, 12:32 PM

## 2014-03-22 NOTE — Progress Notes (Signed)
Npo for speech eval. Pt coughing while taking meds and eating.

## 2014-03-22 NOTE — Evaluation (Signed)
Physical Therapy Evaluation Patient Details Name: Peter [name] MRN: [id number] DOB: Mar 27, 1919 Today's Date: 03/22/2014   History of Present Illness  Peter [name] is a 80 y.o. male presenting with weakness and fall . PMH is significant for prostate Ca and skin Ca, DM, HTN, HLD, Anemia, and Sleep Apnea   Clinical Impression   Pt admitted with above. Pt currently with functional limitations due to the deficits listed below (see PT Problem List).  Pt will benefit from skilled PT to increase their independence and safety with mobility to allow discharge to the venue listed below.       Follow Up Recommendations SNF    Equipment Recommendations  Rolling walker with 5" wheels;3in1 (PT)    Recommendations for Other Services       Precautions / Restrictions Precautions Precautions: Fall Restrictions Weight Bearing Restrictions: No      Mobility  Bed Mobility Overal bed mobility: Needs Assistance Bed Mobility: Supine to Sit     Supine to sit: Min assist     General bed mobility comments: Handheld assist to pull to sit WOB  Transfers Overall transfer level: Needs assistance Equipment used: 1 person hand held assist Transfers: Sit to/from Stand Sit to Stand: Mod assist         General transfer comment: REquired anti-gravity assist and steadying assist for successful sit to stand  Ambulation/Gait Ambulation/Gait assistance: Min assist;Mod assist Ambulation Distance (Feet): 15 Feet Assistive device: 1 person hand held assist Gait Pattern/deviations: Decreased stride length     General Gait Details: Erratic step width, leading to loss of balance requiring mod assist to prevent fall; Tending to reach out for UE support; Will likely do well with RW  Stairs            Wheelchair Mobility    Modified Rankin (Stroke Patients Only)       Balance Overall balance assessment: Needs assistance   Sitting balance-Leahy Scale: Good     Standing balance  support: Single extremity supported Standing balance-Leahy Scale: Poor                               Pertinent Vitals/Pain no apparent distress     Home Living Family/patient expects to be discharged to:: Skilled nursing facility Living Arrangements: Alone                    Prior Function Level of Independence: Independent with assistive device(s)         Comments: cane and rW prn     Hand Dominance        Extremity/Trunk Assessment   Upper Extremity Assessment: Generalized weakness           Lower Extremity Assessment: Generalized weakness         Communication   Communication: Expressive difficulties  Cognition Arousal/Alertness: Awake/alert Behavior During Therapy: WFL for tasks assessed/performed Overall Cognitive Status: Impaired/Different from baseline Area of Impairment: Problem solving             Problem Solving: Slow processing General Comments: During intake interview, pt expressed concern re: not being able to keep his train of thought, at times apologizing, and seemingly annoyed for sounding so what he referred to as "stupid"    General Comments      Exercises        Assessment/Plan    PT Assessment Patient needs continued PT services  PT Diagnosis Difficulty walking;Generalized weakness  PT Problem List Decreased strength;Decreased range of motion;Decreased activity tolerance;Decreased balance;Decreased mobility;Decreased coordination;Decreased knowledge of use of DME  PT Treatment Interventions DME instruction;Gait training;Stair training;Functional mobility training;Therapeutic activities;Therapeutic exercise;Balance training;Patient/family education   PT Goals (Current goals can be found in the Care Plan section) Acute Rehab PT Goals Patient Stated Goal: wants to get better PT Goal Formulation: With patient Time For Goal Achievement: 04/05/14 Potential to Achieve Goals: Good    Frequency Min 3X/week    Barriers to discharge Decreased caregiver support      Co-evaluation               End of Session Equipment Utilized During Treatment: Gait belt Activity Tolerance: Patient tolerated treatment well Patient left: in bed;with call bell/phone within reach;with bed alarm set;Other (comment) (pt chose to sit EOB) Nurse Communication: Mobility status         Time: 1057-1140 PT Time Calculation (min): 43 min   Charges:   PT Evaluation $Initial PT Evaluation Tier I: 1 Procedure PT Treatments $Gait Training: 8-22 mins $Therapeutic Activity: 23-37 mins   PT G Codes:          [facility] [place] 03/22/2014, 2:15 PM  Roney Marion, [place] Pager [phone] Office [phone]

## 2014-03-22 NOTE — Progress Notes (Signed)
I discussed this patient's clinical course and plan with the resident team, and I agree with the resident note as documented.  Keilynn Marano, MD 

## 2014-03-22 NOTE — Progress Notes (Signed)
Pt refused OT, stated "we will start tomorrow.

## 2014-03-22 NOTE — Progress Notes (Signed)
Family Medicine Teaching Service Daily Progress Note Intern Pager: [phone]  Patient name: Peter [name] Medical record number: [id number] Date of birth: July 11, 1919 Age: 80 y.o. Gender: male  Primary Care Provider: Lupita [name], MD Consultants: none Code Status: full  Pt Overview and Major Events to Date:   Assessment and Plan: Peter [name] is a 80 y.o. male presenting with weakness and fall . PMH is significant for prostate Ca and skin Ca, DM, HTN, HLD, Anemia, and Sleep Apnea   # Fall, possible presyncope - CK elevated on admission but does not meet dx for rhabdo in absence of UA findings or myalgias.Stable currently 572 -> 587. Will w/u for syncopal cause and does have pacemaker. Morning EKG with prolonged Qtc - Telemetry, vitals per unit  - TSH nml, mildly hypokalemic, will replete K with 40kdur - Troponin negative x 1 and denies CP - Will obtain Echo to r/o CHF or valvular cause  - PT/OT/CSW for possible placement   #Cough- states it is worse since he came in; speech somewhat difficult to understand - NPO for now until speech eval  # Hx of CAD and arrhythmia, pacemaker in place  - Continue home medications   #Hx of Prostate CA w/ mets to the bone  - managed by Heme/Onc, appreciate recs. Currently on Zytiga but considering chemo   # Anemia of chronic disease (2/2 cancer); hgb currently 8.1 (most likely dilutional given three cell line drop) - Monitor daily CBC   # Hx of Hyperlipidemia  - Not indicated for his age   # DM II  - last A1C about one yr ago, will repeat (currently in process) - Sensitive SSI while here, hold metformin   # HTN Hx  - Not on any medications for this at home, monitor closely   FEN/GI: NPO until SLP eval, NS @ 100 cc/hr  Prophylaxis: SQ heparin  Disposition: further rehydration, PT/OT work up   Subjective: coughing and choking this morning with sips of fluid   Objective: Temp:  [98 F (36.7 C)-98.8 F (37.1 C)] 98.8 F  (37.1 C) (05/18 0910) Pulse Rate:  [93-110] 93 (05/18 0910) Resp:  [18-20] 20 (05/18 0910) BP: (90-128)/(49-98) 102/50 mmHg (05/18 0910) SpO2:  [96 %-99 %] 96 % (05/18 0910) Weight:  [195 lb 5.2 oz (88.6 kg)] 195 lb 5.2 oz (88.6 kg) (05/17 1733) Physical Exam: General: NAD, frequent cough after sipping on fluids  HEENT: NCAT, dry mucous membranes, EOMI B/L, PERRL Cardiovascular: RRR, no murmurs appreciated  Respiratory: CTAB  Abdomen: NABS, Soft/NT/ND, + ventral hernia (more noticeable with movements), reducible  Extremities: +1 B/L Edema, +2 pulses B/L LE  Skin: No rashes, + scattered ecchymosis  Neuro: AAO x 3, no focal deficits   Laboratory:  Recent Labs Lab 04/01/2014 1135 03/22/14 0615  WBC 6.1 4.9  HGB 10.2* 8.1*  HCT 30.0* 24.5*  PLT 182 154    Recent Labs Lab 03/29/2014 1135 03/22/14 0615  NA 139 141  K 3.7 3.4*  CL 101 107  CO2 26 21  BUN 13 11  CREATININE 0.76 0.71  CALCIUM 9.1 8.2*  PROT 6.9 5.7*  BILITOT 1.5* 0.8  ALKPHOS 191* 148*  ALT 17 15  AST 47* 46*  GLUCOSE 113* 106*      Recent Labs Lab 03/31/2014 1135  TROPONINI <0.30     Imaging/Diagnostic Tests: CT head - no acute process  X- ray, Thorax - Pacer leads present, no new fx  X-ray Chest - Osseous sclerotic  markings but no acute process  X-ray Lumbar - Sclerotic process, no acute findings or fx  EKG - Pacer spike present, unchanged from previous    Langston Masker, MD 03/22/2014, 9:14 AM PGY-1, [place] Intern pager: [phone], text pages welcome

## 2014-03-22 NOTE — Progress Notes (Signed)
Speech Therapy consulted with patient.  Pt to stay NPO till modified barium swallow tomorrow. Pt having diffuculty swallowing without coughing.

## 2014-03-22 NOTE — Progress Notes (Signed)
Pt NPO for Barium Swallow in the am.

## 2014-03-22 NOTE — Progress Notes (Signed)
Pt worked with patient. Pt walked in room handheld.  Pt still NPO for speech eval.

## 2014-03-22 NOTE — Clinical Social Work Note (Signed)
CSW talked by phone with patient's daughter Deonte Otting (full assessment to follow) regarding SNF patient as when CSW visited room patient was asleep. Sister in agreement with ST rehab. CSW will follow-up with patient on Tuesday and also send out patient's clinical information to facilities in [facility] 5/19.  Pearl Berlinger Givens, MSW, LCSW [phone]

## 2014-03-22 NOTE — Evaluation (Signed)
Clinical/Bedside Swallow Evaluation Patient Details  Name: Peter [name] MRN: [id number] Date of Birth: January 17, 1919  Today's Date: 03/22/2014 Time: 4098-1191 SLP Time Calculation (min): 30 min  Past Medical History:  Past Medical History  Diagnosis Date  . Cardiac septal defect     closed self during childhood  . Blood transfusion     colon polypectomy, bleeding requiring transfusion  . Hx of colonic polyps 03/03/2005    adenomatous  . Obstructive sleep apnea     Does not use CPAP  . Anemia   . Diabetes mellitus   . Diverticulosis of colon   . GERD without esophagitis   . Hyperlipidemia   . Macular degeneration   . Memory loss of   . Osteoarthritis of spine   . Periumbilical hernia   . Psoriasis   . Chronic venous insufficiency   . AV block, complete   . Mobitz (type) II atrioventricular block   . Obesity, unspecified   . Left bundle branch hemiblock   . Pure hypercholesterolemia   . Cardiac pacemaker in situ 01/18/05    1st one inserted 01/18/05. 2nd one inserted 06/06/10.  Marland Kitchen LBBB (left bundle branch block)   . Lower extremity edema     Chronic   . Fluid retention in legs 01/15/2014  . Need for pneumococcal vaccination 01/15/2014  . Prostate cancer     Urologist watchfully waiting   Past Surgical History:  Past Surgical History  Procedure Laterality Date  . Tonsillectomy      Age 89  . Partial colectomy  07/09/02    For Polyps  . Pacemaker placement  01/18/05  . 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 Cataract extraction  6/12    left eye Dr Katy Fitch  . Pacemaker replacement  06/06/10    Dr Leonia Reeves   HPI:  80 y.o. male presenting with fall which he states he was on the ground for the last 4 days, unable to get up. Per MD note pt moved out of his daughters house, walked around naked constantly and refused to put his clothes on and is now living on his own.  PMH:  cardial septal defect, OSA, DM, GERD, memory loss, prostate cancer, macular degneration.  CT  Stable atrophy with supratentorial small vessel disease. There is no intracranial mass, hemorrhage, or acute appearing infarct.  CXR Lungs are hypoaerated with crowding of the bronchovascular markings. Osseous sclerotic metastatic disease reidentified.  Note in chart that pt. was coughing with meds and when eating.    Assessment / Plan / Recommendation Clinical Impression  Pt. demonstrated indications of pharyngeal dysphagia after thin liquids with immediate cough.  Laryngeal elevation appears decreased and suspect delayed swallow initiation.  Recommend continue NPO with MBS tomorrow.  If he has vital pills, could take crushed in applesauce.    Aspiration Risk  Moderate    Diet Recommendation NPO except meds   Medication Administration: Crushed with puree    Other  Recommendations Recommended Consults: MBS Oral Care Recommendations: Oral care BID   Follow Up Recommendations   (TBD)    Frequency and Duration min 2x/week  2 weeks   Pertinent Vitals/Pain WDL         Swallow Study         Oral/Motor/Sensory Function Overall Oral Motor/Sensory Function: Appears within functional limits for tasks assessed   Ice Chips Ice chips: Not tested   Thin Liquid Thin Liquid: Impaired Pharyngeal  Phase Impairments: Suspected delayed Swallow;Decreased hyoid-laryngeal movement;Cough -  Immediate    Nectar Thick Nectar Thick Liquid: Not tested   Honey Thick Honey Thick Liquid: Not tested   Puree Puree: Impaired Pharyngeal Phase Impairments: Suspected delayed Swallow;Decreased hyoid-laryngeal movement   Solid   GO    Solid: Not tested       Houston Siren M.Ed [employer] [phone]  03/22/2014

## 2014-03-23 ENCOUNTER — Telehealth: Payer: Self-pay | Admitting: Family Medicine

## 2014-03-23 ENCOUNTER — Other Ambulatory Visit: Payer: Self-pay | Admitting: Hematology and Oncology

## 2014-03-23 ENCOUNTER — Inpatient Hospital Stay (HOSPITAL_COMMUNITY): Payer: Medicare Other

## 2014-03-23 ENCOUNTER — Telehealth: Payer: Self-pay | Admitting: Hematology and Oncology

## 2014-03-23 DIAGNOSIS — R42 Dizziness and giddiness: Secondary | ICD-10-CM

## 2014-03-23 LAB — COMPREHENSIVE METABOLIC PANEL
ALK PHOS: 156 U/L — AB (ref 39–117)
ALT: 18 U/L (ref 0–53)
AST: 44 U/L — AB (ref 0–37)
Albumin: 2.6 g/dL — ABNORMAL LOW (ref 3.5–5.2)
BUN: 10 mg/dL (ref 6–23)
CALCIUM: 8.3 mg/dL — AB (ref 8.4–10.5)
CO2: 22 meq/L (ref 19–32)
Chloride: 109 mEq/L (ref 96–112)
Creatinine, Ser: 0.69 mg/dL (ref 0.50–1.35)
GFR calc Af Amer: >90 mL/min (ref >90)
GFR calc non Af Amer: 80 mL/min — ABNORMAL LOW (ref >90)
GLUCOSE: 127 mg/dL — AB (ref 70–99)
POTASSIUM: 3.2 meq/L — AB (ref 3.7–5.3)
Sodium: 144 mEq/L (ref 137–147)
TOTAL PROTEIN: 6.1 g/dL (ref 6.0–8.3)
Total Bilirubin: 1 mg/dL (ref 0.3–1.2)

## 2014-03-23 LAB — CBC WITH DIFFERENTIAL/PLATELET
BASOS ABS: 0 10*3/uL (ref 0.0–0.1)
Basophils Relative: 0 % (ref 0–1)
EOS PCT: 1 % (ref 0–5)
Eosinophils Absolute: 0 10*3/uL (ref 0.0–0.7)
HCT: 24.9 % — ABNORMAL LOW (ref 39.0–52.0)
Hemoglobin: 7.9 g/dL — ABNORMAL LOW (ref 13.0–17.0)
Lymphocytes Relative: 9 % — ABNORMAL LOW (ref 12–46)
Lymphs Abs: 0.7 10*3/uL (ref 0.7–4.0)
MCH: 30.4 pg (ref 26.0–34.0)
MCHC: 31.7 g/dL (ref 30.0–36.0)
MCV: 95.8 fL (ref 78.0–100.0)
Monocytes Absolute: 0.4 10*3/uL (ref 0.1–1.0)
Monocytes Relative: 5 % (ref 3–12)
Neutro Abs: 6.1 10*3/uL (ref 1.7–7.7)
Neutrophils Relative %: 85 % — ABNORMAL HIGH (ref 43–77)
PLATELETS: 163 10*3/uL (ref 150–400)
RBC: 2.6 MIL/uL — ABNORMAL LOW (ref 4.22–5.81)
RDW: 14.5 % (ref 11.5–15.5)
WBC: 7.1 10*3/uL (ref 4.0–10.5)

## 2014-03-23 LAB — GLUCOSE, CAPILLARY
GLUCOSE-CAPILLARY: 143 mg/dL — AB (ref 70–99)
GLUCOSE-CAPILLARY: 135 mg/dL — AB (ref 70–99)
Glucose-Capillary: 119 mg/dL — ABNORMAL HIGH (ref 70–99)
Glucose-Capillary: 121 mg/dL — ABNORMAL HIGH (ref 70–99)

## 2014-03-23 LAB — CK: Total CK: 355 U/L — ABNORMAL HIGH (ref 7–232)

## 2014-03-23 MED ORDER — POTASSIUM CHLORIDE 10 MEQ/100ML IV SOLN: 10.0000 meq | INTRAVENOUS | Status: DC

## 2014-03-23 MED ORDER — RESOURCE THICKENUP CLEAR PO POWD
ORAL | Status: DC | PRN
  Filled 2014-03-23: qty 125

## 2014-03-23 MED ORDER — POTASSIUM CHLORIDE CRYS ER 20 MEQ PO TBCR
40.0000 meq | EXTENDED_RELEASE_TABLET | Freq: Once | ORAL | Status: AC
  Administered 2014-03-23: 40 meq via ORAL
  Filled 2014-03-23: qty 2

## 2014-03-23 NOTE — Telephone Encounter (Signed)
Will forward to PCP 

## 2014-03-23 NOTE — Procedures (Addendum)
Objective Swallowing Evaluation: Modified Barium Swallowing Study  Patient Details  Name: Peter [name] MRN: [id number] Date of Birth: 07-07-1919  Today's Date: 03/23/2014 Time: 1025-8527 SLP Time Calculation (min): 32 min  Past Medical History:  Past Medical History  Diagnosis Date  . Cardiac septal defect     closed self during childhood  . Blood transfusion     colon polypectomy, bleeding requiring transfusion  . Hx of colonic polyps 03/03/2005    adenomatous  . Obstructive sleep apnea     Does not use CPAP  . Anemia   . Diabetes mellitus   . Diverticulosis of colon   . GERD without esophagitis   . Hyperlipidemia   . Macular degeneration   . Memory loss of   . Osteoarthritis of spine   . Periumbilical hernia   . Psoriasis   . Chronic venous insufficiency   . AV block, complete   . Mobitz (type) II atrioventricular block   . Obesity, unspecified   . Left bundle branch hemiblock   . Pure hypercholesterolemia   . Cardiac pacemaker in situ 01/18/05    1st one inserted 01/18/05. 2nd one inserted 06/06/10.  Marland Kitchen LBBB (left bundle branch block)   . Lower extremity edema     Chronic   . Fluid retention in legs 01/15/2014  . Need for pneumococcal vaccination 01/15/2014  . Prostate cancer     Urologist watchfully waiting   Past Surgical History:  Past Surgical History  Procedure Laterality Date  . Tonsillectomy      Age 58  . Partial colectomy  07/09/02    For Polyps  . Pacemaker placement  01/18/05  . 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 Cataract extraction  6/12    left eye Dr Katy Fitch  . Pacemaker replacement  06/06/10    Dr Leonia Reeves   HPI:  80 y.o. male presenting with fall which he states he was on the ground for the last 4 days, unable to get up. Per MD note pt moved out of his daughters house, walked around naked constantly and refused to put his clothes on and is now living on his own.  PMH:  cardial septal defect, OSA, DM, GERD, memory loss, prostate  cancer, macular degneration.  CT Stable atrophy with supratentorial small vessel disease. There is no intracranial mass, hemorrhage, or acute appearing infarct.  CXR Lungs are hypoaerated with crowding of the bronchovascular markings. Osseous sclerotic metastatic disease reidentified.  Note in chart that pt. was coughing with meds and when eating.      Assessment / Plan / Recommendation Clinical Impression  Dysphagia Diagnosis: Mild oral phase dysphagia;Moderate pharyngeal phase dysphagia;Suspected primary esophageal dysphagia  Clinical impression: Pt presents with a mild oral and moderate pharyngeal dysphagia, which appears to be exacerbated by suspected esophageal component and odynophagia. Pt reported pain with swallowing, with questionable swelling in posterior oral cavity and oropharynx, which was painful to the touch. Pt exhibited a delayed swallow initiation with impaired sensation and timing resulting in silent penetration of thin liquids, requiring multiple cued coughs/swallows to clear. Pt had moderate residue along the posterior portions of the tongue, BOT, and valleculae with all consistencies tested. Residuals were reduced although not cleared with additional cued swallows. Pt appeared to have barium residue throughout his esophagus during esopahgeal scan, although MD was not present to confirm. MD was made aware of odynophagia and esophageal concerns, as well as their possible impact on pharyngeal phase of swallowing. Recommend Dys  2 textures to faciltiate oropharyngeal and esophageal clearance, with nectar thick liquids to maximize airway protection. Will continue to follow.    Treatment Recommendation  Therapy as outlined in treatment plan below    Diet Recommendation Dysphagia 2 (Fine chop);Nectar-thick liquid   Liquid Administration via: Cup;No straw Medication Administration: Crushed with puree Supervision: Patient able to self feed;Full supervision/cueing for compensatory  strategies Compensations: Slow rate;Small sips/bites;Multiple dry swallows after each bite/sip;Follow solids with liquid Postural Changes and/or Swallow Maneuvers: Seated upright 90 degrees;Upright 30-60 min after meal    Other  Recommendations Recommended Consults: Consider esophageal assessment;Other (Comment) (assess odynophagia/possible swelling in oropharynx) Oral Care Recommendations: Oral care BID Other Recommendations: Order thickener from pharmacy;Prohibited food (jello, ice cream, thin soups);Remove water pitcher;Have oral suction available   Follow Up Recommendations  Skilled Nursing facility    Frequency and Duration min 2x/week  2 weeks   Pertinent Vitals/Pain Low grade fevers    SLP Swallow Goals     General Date of Onset: 03/24/2014 HPI: 80 y.o. male presenting with fall which he states he was on the ground for the last 4 days, unable to get up. Per MD note pt moved out of his daughters house, walked around naked constantly and refused to put his clothes on and is now living on his own.  PMH:  cardial septal defect, OSA, DM, GERD, memory loss, prostate cancer, macular degneration.  CT Stable atrophy with supratentorial small vessel disease. There is no intracranial mass, hemorrhage, or acute appearing infarct.  CXR Lungs are hypoaerated with crowding of the bronchovascular markings. Osseous sclerotic metastatic disease reidentified.  Note in chart that pt. was coughing with meds and when eating.  Type of Study: Modified Barium Swallowing Study Reason for Referral: Objectively evaluate swallowing function Previous Swallow Assessment: BSE 5/18 recommending NPO until MBS can be completed Diet Prior to this Study: NPO Temperature Spikes Noted: Yes (low grade) Respiratory Status: Room air History of Recent Intubation: No Behavior/Cognition: Alert;Confused;Requires cueing;Decreased sustained attention;Cooperative Oral Cavity - Dentition: Missing dentition;Poor condition Oral  Motor / Sensory Function: Within functional limits Self-Feeding Abilities: Able to feed self Patient Positioning: Upright in chair Baseline Vocal Quality: Other (comment) (rough quality) Volitional Cough: Strong Volitional Swallow: Able to elicit (facial grimacing, pt reports pain) Anatomy: Other (Comment) (? swelling in posterior oral cavity/orophaarynx where pt reports pain, MD made aware) Pharyngeal Secretions: Not observed secondary MBS    Reason for Referral Objectively evaluate swallowing function   Oral Phase Oral Preparation/Oral Phase Oral Phase: Impaired Oral - Nectar Oral - Nectar Cup: Lingual/palatal residue;Reduced posterior propulsion Oral - Nectar Straw: Lingual/palatal residue;Reduced posterior propulsion Oral - Thin Oral - Thin Cup: Lingual/palatal residue;Reduced posterior propulsion Oral - Solids Oral - Puree: Lingual/palatal residue;Reduced posterior propulsion Oral - Mechanical Soft: Lingual/palatal residue;Reduced posterior propulsion   Pharyngeal Phase Pharyngeal Phase Pharyngeal Phase: Impaired Pharyngeal - Nectar Pharyngeal - Nectar Cup: Delayed swallow initiation;Premature spillage to pyriform sinuses;Reduced anterior laryngeal mobility;Reduced laryngeal elevation;Reduced tongue base retraction;Penetration/Aspiration during swallow;Pharyngeal residue - valleculae Penetration/Aspiration details (nectar cup): Material enters airway, remains ABOVE vocal cords then ejected out Pharyngeal - Nectar Straw: Delayed swallow initiation;Reduced anterior laryngeal mobility;Reduced tongue base retraction;Premature spillage to pyriform sinuses;Reduced laryngeal elevation;Penetration/Aspiration during swallow;Pharyngeal residue - valleculae Penetration/Aspiration details (nectar straw): Material enters airway, remains ABOVE vocal cords and not ejected out Pharyngeal - Thin Pharyngeal - Thin Cup: Delayed swallow initiation;Reduced anterior laryngeal mobility;Reduced tongue base  retraction;Reduced laryngeal elevation;Penetration/Aspiration during swallow;Pharyngeal residue - valleculae;Premature spillage to pyriform sinuses Penetration/Aspiration details (thin cup): Material  enters airway, remains ABOVE vocal cords and not ejected out Pharyngeal - Solids Pharyngeal - Puree: Delayed swallow initiation;Premature spillage to valleculae;Reduced anterior laryngeal mobility;Reduced laryngeal elevation;Reduced tongue base retraction;Pharyngeal residue - valleculae Pharyngeal - Mechanical Soft: Delayed swallow initiation;Premature spillage to valleculae;Reduced anterior laryngeal mobility;Reduced tongue base retraction;Pharyngeal residue - valleculae;Reduced laryngeal elevation Penetration/Aspiration details (mechanical soft): Material does not enter airway  Cervical Esophageal Phase    GO    Cervical Esophageal Phase Cervical Esophageal Phase: Impaired Cervical Esophageal Phase - Comment Cervical Esophageal Comment: pt appeared to have decreased esophageal clearance of bolus material, which appeared to fill esophagus (MD not present to confirm)          Germain Osgood, M.A. CCC-SLP [phone]  Germain Osgood 03/23/2014, 11:21 AM

## 2014-03-23 NOTE — Telephone Encounter (Signed)
s.qw. pt grandaughter and advised on appt d.t. change...Marland Kitchenok adn awaer

## 2014-03-23 NOTE — Telephone Encounter (Signed)
Pt with [facility]: Pt has family issues going on. He will not allow them to come to his house to provide home health care and physical therapty Just FYI

## 2014-03-23 NOTE — Progress Notes (Signed)
OT Cancellation Note  Patient Details Name: Peter [name] MRN: [id number] DOB: 02-08-1919   Cancelled Treatment:    Reason Eval/Treat Not Completed: Other (comment) Pt's current D/C plan is SNF. No apparent immediate acute care OT needs, therefore will defer OT to SNF. If OT eval is needed please call Acute Rehab Dept. at [phone] or text page OT at [phone].    Almon Register [phone] 03/23/2014, 12:03 PM

## 2014-03-23 NOTE — Progress Notes (Signed)
Patient scored a 24 on MMSE. He was not able to write a sentence or copy the design because he could not see without his glasses, although he was able to read command sentence. Zero points given for both of these items. I also completed a PHQ-9. Patient scored an 11, indicating moderate depression. He states that his problems made it somewhat difficult to do his work, take care of things at home or get along with other people.  During visit, patient showed me a bottle in which he has been collecting thick, blood tinged phlegm.  He coughed often during assessment    Milele Bynum MS4   Upper Level Addendum: I have discussed the above results with MS4 Milele Bynum. MMSE confirms intact cognition. PHQ-9 suggestive of depressive symptoms. Will have team discuss with pt whether he is interested in SSRI therapy tomorrow.  Regarding phlegm, CXR negative on admission. Continue to monitor respiratory status.  Chrisandra Netters, MD Family Medicine PGY-2

## 2014-03-23 NOTE — Progress Notes (Signed)
FMTS Attending Note Patient seen and examined by me, discussed with resident team and I agree with Dr Karamalegos' assessment and plan as documented.  Lindell Tussey, MD 

## 2014-03-23 NOTE — Progress Notes (Signed)
MD ordered shoulder xray. Transport attempted to pick up pt. Pt refused. Will continue to monitor. Manya Silvas, RN

## 2014-03-23 NOTE — Progress Notes (Signed)
Family Medicine Teaching Service Daily Progress Note Intern Pager: [phone]  Patient name: Peter [name] Medical record number: [id number] Date of birth: Apr 10, 1919 Age: 80 y.o. Gender: male  Primary Care Provider: Lupita [name], MD Consultants: none Code Status: full  Pt Overview and Major Events to Date:   Assessment and Plan: Peter [name] is a 80 y.o. male presenting with weakness and fall . PMH is significant for prostate Ca and skin Ca, DM, HTN, HLD, Anemia, and Sleep Apnea   # Fall, likely secondary to deconditioning, possible presyncope - On admission, CK elevated (572-->587), currently stable, did not meet dx for rhabdo in absence of UA findings or myalgias. Continue work-up for potential syncopal etiology, note pt with pacemaker. - Telemetry, vitals per unit - EKG on 5/18 with QTc 528, however this is likely due to paced rhythm, as QTc appears normal on EKG - TSH nml, mildly hypokalemic - Troponin negative x 1 and denies CP - Improved CK (587-->355) - 2D ECHO (ordered), to r/o CHF or valvular etiology - PT - Recommend SNF (equipment needs: Rolling walker with 5" wheels, 3in1) - OT (refused on 5/18 to rest), to see patient again on 5/19 - CSW - patient's sister Peter [name]) agreeable to short-term SNF rehab, sent out information to SNFs, status pending, further eval on 5/19 - Patient with capacity at this time. Will perform MMSE for baseline mental status assessment  # Right Shoulder Pain - Reported prior injury, suspect worsened d/t recent fall. Neurovascularly intact on exam, intact distal muscle strength, with limited shoulder ROM (forward flexion d/t pain). Additionally, per chart review recent similar complaint at Heme/Onc office (02/24/14) d/t fall, X-rays negative at that time. - Ordered R-shoulder Xray  # Mild Hypokalemia - s/p Kdur 40 mEq - K 3.7-->3.4-->3.2 - Continue to replete K today, if MBS can resume PO then will use K+ PO, otherwise plan  for K+ IV - Monitor daily BMET   # Cough / Aspiration risk - Suspect mild worsening since admission, with some difficult to understand speech. Concern for potential aspiration with thin liquids. - NPO (pending clearance), except meds (can be crushed in apple sauce) - c/s SLP - MBS (on 5/19), d/t evidence of pharyngeal dysphagia with thin liquids  # Hx of CAD and arrhythmia, pacemaker in place  - Continue home medications   # Hx of Prostate CA w/ mets to the bone  - Followed by Heme/Onc (Dr. Alvy [name]) - Continue current Zytiga 500mg  PO, plus Prednisone 5mg  PO BID - Contacted Dr. Alvy [name] for update today. Agrees with current treatment plan and pursuit of SNF placement given patient's age and deconditioning. Expressed that patient had emphasized wishes for "No aggressive treatment", has not had any recent scans, and primarily only following PSA for progress (significant improvement 1500-->300), no change to current plan with Zytiga + Prednisone. Recommend arrangements with SNF to continue Zytiga (may need family to pick up rx and drop off for use).  # Anemia of chronic disease, secondary to Prostate CA - hgb currently stable 8.1-->7.9, (most likely dilutional given three cell line drop) - Monitor daily CBC  - Heme/Onc (Dr. Alvy [name]) recommends to consider 1-2u PRBC transfusion prior to discharge to SNF, given persistent chronic anemia, may be contributing to generalized weakness, expect patient would have better recovery  # Hx of Hyperlipidemia  - Hold statin therapy  # DM II  - HgbA1c 5.9 - Sensitive SSI while here, hold metformin   # HTN Hx  - Not on  any medications for this at home, monitor closely   FEN/GI: NPO until SLP eval, NS @ 100 cc/hr  Prophylaxis: SQ heparin  Disposition: Admitted to [place] for rehydration and PT/OT/CSW assessment for potential placement. Continues to work with PT/OT, anticipate to be placed to SNF for short-term rehab, pending further  work-up.  Subjective: Resting in bed, hoarse voice difficulty communicating, c/o coughing up some thick mucus with difficulty clearing secretions, c/o of throat discomfort, otherwise denies any acute complaints. Additionally, complains of "poor hospitality in the hospital".  Objective: Temp:  [98.7 F (37.1 C)-99 F (37.2 C)] 99 F (37.2 C) (05/18 1745) Pulse Rate:  [93-112] 104 (05/18 2033) Resp:  [18-20] 20 (05/18 2033) BP: (102-137)/(50-66) 126/59 mmHg (05/18 2033) SpO2:  [90 %-96 %] 90 % (05/18 2033) Weight:  [198 lb 13.7 oz (90.2 kg)] 198 lb 13.7 oz (90.2 kg) (05/18 2033) Physical Exam: General: conversational, NAD HEENT: NCAT, EOMI, PERRL, very dry mucous membranes Cardiovascular: RRR, no murmurs appreciated  Respiratory: CTAB, limited due to position  Abdomen: NABS, Soft/NT/ND, + ventral hernia (more noticeable with movements), reducible  Extremities: R-shoulder generalized tenderness with limited ROM, +1 B/L Edema, +2 pulses B/L LE  Skin: No rashes, + scattered ecchymosis  Neuro: AAO x 2 (person, location, DOB, but not current date), some word finding difficulties, no focal deficits, grossly intact muscle strength b/l (except 4/5 weakness due to tenderness with R-shoulder)  Laboratory:  Recent Labs Lab 03/13/2014 1135 03/22/14 0615  WBC 6.1 4.9  HGB 10.2* 8.1*  HCT 30.0* 24.5*  PLT 182 154    Recent Labs Lab 03/18/2014 1135 03/22/14 0615  NA 139 141  K 3.7 3.4*  CL 101 107  CO2 26 21  BUN 13 11  CREATININE 0.76 0.71  CALCIUM 9.1 8.2*  PROT 6.9 5.7*  BILITOT 1.5* 0.8  ALKPHOS 191* 148*  ALT 17 15  AST 47* 46*  GLUCOSE 113* 106*       Recent Labs Lab 03/20/2014 1135  TROPONINI <0.30     Imaging/Diagnostic Tests: CT head - no acute process  X- ray, Thorax - Pacer leads present, no new fx  X-ray Chest - Osseous sclerotic markings but no acute process  X-ray Lumbar - Sclerotic process, no acute findings or fx  EKG - Pacer spike present, unchanged  from previous    Peter Putnam, DO 03/23/2014, 12:00 AM PGY-1, [place] Intern pager: [phone], text pages welcome

## 2014-03-24 LAB — GLUCOSE, CAPILLARY
GLUCOSE-CAPILLARY: 138 mg/dL — AB (ref 70–99)
Glucose-Capillary: 136 mg/dL — ABNORMAL HIGH (ref 70–99)
Glucose-Capillary: 133 mg/dL — ABNORMAL HIGH (ref 70–99)
Glucose-Capillary: 125 mg/dL — ABNORMAL HIGH (ref 70–99)

## 2014-03-24 LAB — TYPE AND SCREEN
ABO/RH(D): O POS
ANTIBODY SCREEN: NEGATIVE

## 2014-03-24 LAB — CBC
HCT: 26.5 % — ABNORMAL LOW (ref 39.0–52.0)
Hemoglobin: 8.8 g/dL — ABNORMAL LOW (ref 13.0–17.0)
MCH: 31.1 pg (ref 26.0–34.0)
MCHC: 33.2 g/dL (ref 30.0–36.0)
MCV: 93.6 fL (ref 78.0–100.0)
PLATELETS: 146 10*3/uL — AB (ref 150–400)
RBC: 2.83 MIL/uL — ABNORMAL LOW (ref 4.22–5.81)
RDW: 14.4 % (ref 11.5–15.5)
WBC: 6.6 10*3/uL (ref 4.0–10.5)

## 2014-03-24 LAB — ABO/RH: ABO/RH(D): O POS

## 2014-03-24 MED ORDER — GUAIFENESIN ER 600 MG PO TB12
600.0000 mg | ORAL_TABLET | Freq: Two times a day (BID) | ORAL | Status: DC
  Filled 2014-03-24: qty 1

## 2014-03-24 MED ORDER — PREDNISONE 5 MG PO TABS: 5.0000 mg | ORAL_TABLET | Freq: Two times a day (BID) | ORAL | Status: AC

## 2014-03-24 MED ORDER — FUROSEMIDE 20 MG PO TABS: 20.0000 mg | ORAL_TABLET | Freq: Every day | ORAL | Status: AC

## 2014-03-24 MED ORDER — BOOST HIGH PROTEIN PO LIQD: 1.0000 | ORAL | Status: AC

## 2014-03-24 MED ORDER — GUAIFENESIN ER 600 MG PO TB12: 600.0000 mg | ORAL_TABLET | Freq: Two times a day (BID) | ORAL | Status: DC

## 2014-03-24 MED ORDER — GUAIFENESIN ER 600 MG PO TB12: 600.0000 mg | ORAL_TABLET | Freq: Two times a day (BID) | ORAL | Status: AC

## 2014-03-24 MED ORDER — BOOST HIGH PROTEIN PO LIQD
1.0000 | Freq: Three times a day (TID) | ORAL | Status: DC
  Administered 2014-03-24 – 2014-03-25 (×2): 1 via ORAL
  Filled 2014-03-24 (×10): qty 1

## 2014-03-24 MED ORDER — METFORMIN HCL ER 500 MG PO TB24: 500.0000 mg | ORAL_TABLET | Freq: Every day | ORAL | Status: DC

## 2014-03-24 MED ORDER — CALCIUM CARBONATE-VITAMIN D 500-200 MG-UNIT PO TABS: 1.0000 | ORAL_TABLET | Freq: Every day | ORAL | Status: AC

## 2014-03-24 MED ORDER — ABIRATERONE ACETATE 250 MG PO TABS: 500.0000 mg | ORAL_TABLET | Freq: Every day | ORAL | Status: AC

## 2014-03-24 MED ORDER — TRAMADOL HCL 50 MG PO TABS: 50.0000 mg | ORAL_TABLET | Freq: Four times a day (QID) | ORAL | Status: AC | PRN

## 2014-03-24 NOTE — Progress Notes (Signed)
INITIAL NUTRITION ASSESSMENT  DOCUMENTATION CODES Per approved criteria  -Obesity Unspecified   INTERVENTION: Increase Boost Plus to TID. Diet texture and liquid consistency per SLP. RD to continue to follow nutrition care plan.  NUTRITION DIAGNOSIS: Inadequate oral intake related to AMS as evidenced by meal refusals and poor po intake.   Goal: Intake to meet >90% of estimated nutrition needs.  Monitor:  weight trends, lab trends, I/O's, PO intake, supplement tolerance  Reason for Assessment: MD Consult for Poor PO Intake and Assessment  80 y.o. male  Admitting Dx: Dehydration  ASSESSMENT: PMHx significant for squamous cell CA, osteoporosis, HLD. Admitted s/p fall down x 4 days. Work-up ongoing.  MBSS completed 5/19 - recommendations for Dysphagia 2 diet with Nectar-Thickened liquids.  Per nursing staff, pt with meal refusals. Did not eat breakfast this morning, however was given Boost Plus and consumed at 2 and half so far this morning. Per staff, meal refusals seem to be behavioral. Pt appears well-nourished. Cannot appropriately answer my questions.  CBG's: 135 - 138 Potassium low at 3.2  Height: Ht Readings from Last 1 Encounters:  03-23-2014 5\' 6"  (1.676 m)    Weight: Wt Readings from Last 1 Encounters:  03/23/14 203 lb 9.6 oz (92.352 kg)    Ideal Body Weight: 142 lb  % Ideal Body Weight: 143%  Wt Readings from Last 10 Encounters:  03/23/14 203 lb 9.6 oz (92.352 kg)  02/24/14 199 lb (90.266 kg)  01/30/14 195 lb (88.451 kg)  01/21/14 208 lb (94.348 kg)  01/15/14 209 lb 11.2 oz (95.119 kg)  11/25/13 195 lb 3.2 oz (88.542 kg)  11/04/13 205 lb 6.4 oz (93.169 kg)  10/14/13 202 lb 4.8 oz (91.763 kg)  09/14/13 200 lb 1.6 oz (90.765 kg)  08/06/13 199 lb 4.8 oz (90.402 kg)    Usual Body Weight: 200 lb  % Usual Body Weight: 101  BMI:  Body mass index is 32.88 kg/(m^2). Obese Class I  Estimated Nutritional Needs: Kcal: 1500 - 1750 Protein: 60 - 75  g Fluid: 1.8 - 2 liters  Skin: intact  Diet Order: Dysphagia 2; Nectar Thickened Liquids  EDUCATION NEEDS: -No education needs identified at this time   Intake/Output Summary (Last 24 hours) at 03/24/14 1021 Last data filed at 03/24/14 0900  Gross per 24 hour  Intake     60 ml  Output     50 ml  Net     10 ml    Last BM: 5/17  Labs:   Recent Labs Lab Mar 23, 2014 1135 03/22/14 0615 03/23/14 0612  NA 139 141 144  K 3.7 3.4* 3.2*  CL 101 107 109  CO2 26 21 22   BUN 13 11 10   CREATININE 0.76 0.71 0.69  CALCIUM 9.1 8.2* 8.3*  GLUCOSE 113* 106* 127*    CBG (last 3)   Recent Labs  03/23/14 1648 03/23/14 2141 03/24/14 0858  GLUCAP 143* 135* 138*    Scheduled Meds: . abiraterone Acetate  500 mg Oral Daily  . calcium-vitamin D  1 tablet Oral Q breakfast  . feeding supplement  1 Container Oral BH-q7a  . guaiFENesin  600 mg Oral BID  . heparin  5,000 Units Subcutaneous 3 times per day  . insulin aspart  0-9 Units Subcutaneous TID WC  . predniSONE  5 mg Oral BID WC  . sodium chloride  3 mL Intravenous Q12H    Continuous Infusions: . sodium chloride 125 mL/hr at 03/23/14 1755    Past Medical History  Diagnosis Date  . Cardiac septal defect     closed self during childhood  . Blood transfusion     colon polypectomy, bleeding requiring transfusion  . Hx of colonic polyps 03/03/2005    adenomatous  . Obstructive sleep apnea     Does not use CPAP  . Anemia   . Diabetes mellitus   . Diverticulosis of colon   . GERD without esophagitis   . Hyperlipidemia   . Macular degeneration   . Memory loss of   . Osteoarthritis of spine   . Periumbilical hernia   . Psoriasis   . Chronic venous insufficiency   . AV block, complete   . Mobitz (type) II atrioventricular block   . Obesity, unspecified   . Left bundle branch hemiblock   . Pure hypercholesterolemia   . Cardiac pacemaker in situ 01/18/05    1st one inserted 01/18/05. 2nd one inserted 06/06/10.  Marland Kitchen LBBB  (left bundle branch block)   . Lower extremity edema     Chronic   . Fluid retention in legs 01/15/2014  . Need for pneumococcal vaccination 01/15/2014  . Prostate cancer     Urologist watchfully waiting    Past Surgical History  Procedure Laterality Date  . Tonsillectomy      Age 41  . Partial colectomy  07/09/02    For Polyps  . Pacemaker placement  01/18/05  . 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 Cataract extraction  6/12    left eye Dr Katy Fitch  . Pacemaker replacement  06/06/10    Dr Leonia Reeves    Inda Coke MS, RD, LDN Inpatient Registered Dietitian Pager: [phone] After-hours pager: [phone]

## 2014-03-24 NOTE — Progress Notes (Signed)
FMTS Attending Note Patient's care discussed with resident team and I agree with Dr Parks Ranger' assessment and plan as documented.  Dalbert Mayotte, MD

## 2014-03-24 NOTE — Progress Notes (Signed)
Patient has been refusing all meals and medications today. Patient is confused and combative. Hand mitts have been applied and patient has been reoriented. Family has been notified. MD has been notified.

## 2014-03-24 NOTE — Progress Notes (Signed)
Pt is very combative

## 2014-03-24 NOTE — Progress Notes (Signed)
Speech Language Pathology Treatment: Dysphagia  Patient Details Name: Peter [name] MRN: [id number] DOB: 09-Sep-1919 Today's Date: 03/24/2014 Time: 0092-3300 SLP Time Calculation (min): 10 min  Assessment / Plan / Recommendation Clinical Impression  Pt was very distracted today, requiring Max cues for sustained attention to PO trials and for utilization of safe swallowing strategies, resulting in immediate cough x1 after cup sip of nectar. Pt consumed Dys 3 textures and nectar thick liquids, alternating consistencies to clear oral residuals from solid trials. Pt quickly became fatigued and fell asleep, and further PO intake was held. Will continue to follow.   HPI HPI: 80 y.o. male presenting with fall which he states he was on the ground for the last 4 days, unable to get up. Per MD note pt moved out of his daughters house, walked around naked constantly and refused to put his clothes on and is now living on his own.  PMH:  cardial septal defect, OSA, DM, GERD, memory loss, prostate cancer, macular degneration.  CT Stable atrophy with supratentorial small vessel disease. There is no intracranial mass, hemorrhage, or acute appearing infarct.  CXR Lungs are hypoaerated with crowding of the bronchovascular markings. Osseous sclerotic metastatic disease reidentified.  Note in chart that pt. was coughing with meds and when eating.    Pertinent Vitals N/A  SLP Plan  Continue with current plan of care    Recommendations Diet recommendations: Dysphagia 2 (fine chop);Nectar-thick liquid Liquids provided via: Cup;No straw Medication Administration: Crushed with puree Supervision: Patient able to self feed;Full supervision/cueing for compensatory strategies Compensations: Slow rate;Small sips/bites;Multiple dry swallows after each bite/sip;Follow solids with liquid Postural Changes and/or Swallow Maneuvers: Seated upright 90 degrees;Upright 30-60 min after meal              Oral Care  Recommendations: Oral care BID Follow up Recommendations: Skilled Nursing facility Plan: Continue with current plan of care    GO      Germain Osgood, M.A. CCC-SLP [phone]  Germain Osgood 03/24/2014, 4:28 PM

## 2014-03-24 NOTE — Progress Notes (Signed)
Family Medicine Teaching Service Daily Progress Note Intern Pager: [phone]  Patient name: Peter [name] Medical record number: [id number] Date of birth: [birth year] Age: 80 y.o. Gender: male  Primary Care Provider: Lupita [name], MD Consultants: none Code Status: full  Pt Overview and Major Events to Date:   Assessment and Plan: Peter [name] is a 80 y.o. male presenting with weakness and fall . PMH is significant for prostate Ca and skin Ca, DM, HTN, HLD, Anemia, and Sleep Apnea   # Fall, likely secondary to deconditioning, possible presyncope - On admission, CK elevated (572-->587), currently stable, did not meet dx for rhabdo in absence of UA findings or myalgias. Troponin (neg), EKG (paced rhythm), TSH (nml), mild hypokalemia. Continue work-up for potential syncopal etiology, note pt with pacemaker. - Telemetry, vitals per unit - Improved CK (587-->355) - 2D ECHO (ordered on 5/18), to r/o CHF or valvular etiology - PT - Recommend SNF (equipment needs: Rolling walker with 5" wheels, 3in1) - ordered - CSW - patient's sister Andra Matsuo) agreeable to short-term SNF rehab, sent out information to SNFs, status pending, further eval - Patient clinically with capacity to make medical decisions, additionally MMSE (24/30, on 03/23/14)  # Right Shoulder Pain - Reported prior injury (1 month ago) after fall, suspect worsened d/t recent fall. Neurovascularly intact on exam, intact distal muscle strength, with limited shoulder ROM (forward flexion d/t pain). Additionally, per chart review recent similar complaint at Heme/Onc office (02/24/14) d/t fall, X-rays negative at that time. - R-shoulder Xray - No acute frx or sublux. Degen changes to [facility] joint, with high-riding humeral head suspicious for rotator cuff insufficiency, additionally sclerotic foci concerning for metastatic disease  # Possible Depression - Patient screened positive on PHQ-9 (11 pts) mild depression - Discussed  with patient, declines SSRI treatment at this time. Appreciative of concern.  # Mild Hypokalemia - Improved - K 3.7-->3.4-->3.2 - s/p K+ PO 40 mEq  # Mild dysphagia, suspected esophageal dysphagia - Suspect worsening since admission, with some difficulty with speech. - c/s SLP - MBS (on 5/19) recommend Dysphagia 2 Diet, Nectar thick liquids, continue to follow. Concern for odynophagia and suspected primary esophageal dysphagia - Continued poor PO intake, with decreased appetite, nursing to assist with improved PO today and provide oral care - If persistent poor PO, will replace IV for IVF - Consult Nutrition - re: dietary needs and poor PO - Start Guaifenesin 600mg  BID to help mobilize secretions  # Hx of CAD and arrhythmia, pacemaker in place  - Continue home medications   # Hx of Prostate CA, metastatic to bone - Followed by Heme/Onc (Dr. Alvy Bimler) - Unable to continue current Zytiga 500mg  PO (due to not stocked in pharmacy), recommend bring from home to SNF - Continue Prednisone 5mg  PO BID - Shoulder X-ray with suspicious bone mets, consistent with known disease - Contacted Dr. Alvy Bimler for update today. Agrees with current treatment plan and pursuit of SNF placement given patient's age and deconditioning. Expressed that patient had emphasized wishes for "No aggressive treatment", has not had any recent scans, and primarily only following PSA for progress (significant improvement 1500-->300), no change to current plan with Zytiga + Prednisone. Recommend arrangements with SNF to continue Zytiga (may need family to pick up rx and drop off for use).  # Anemia of chronic disease, secondary to Prostate CA - hgb currently stable 8.1-->7.9, (most likely dilutional given three cell line drop) - Re-check CBC, Type & Screen, discuss potential transfusion with patient -  Heme/Onc (Dr. Alvy Bimler) recommends to consider 1-2u PRBC transfusion prior to discharge to SNF, given persistent chronic anemia, may  be contributing to generalized weakness, expect patient would have better recovery  # Hx of Hyperlipidemia  - Hold statin therapy  # DM II  - HgbA1c 5.9 - Sensitive SSI while here, hold metformin   # HTN Hx  - Not on any medications for this at home, monitor closely   FEN/GI: Dysphagia 2 Diet / Nectar thick liquids, NS @ 125 cc/hr (lost IV access) Prophylaxis: SQ heparin  Disposition: Admitted to [facility] [place] for rehydration and PT/OT/CSW assessment for potential placement. Continues to work with PT/OT, recommend SNF, anticipate to be placed to SNF for short-term rehab, pending bed placement.  Subjective: Resting in bed at nurses station. Persistent hoarse voice with dry mouth, c/o feeling "drowsy" today. R-shoulder still painful on attempted movement, otherwise no complaints. Appreciates concern and discussion of potential depression, but declines treatment and states "does not think that it is necessary". Poor PO intake (decreased appetite), per nursing pt removed IV. Otherwise denies acute complaints.  Objective: Temp:  [98.3 F (36.8 C)-98.6 F (37 C)] 98.6 F (37 C) (05/19 2013) Pulse Rate:  [94-104] 104 (05/19 2013) Resp:  [20-22] 22 (05/19 2013) BP: (141-153)/(68-71) 153/71 mmHg (05/19 2013) SpO2:  [92 %-93 %] 93 % (05/19 2013) Weight:  [203 lb 9.6 oz (92.352 kg)] 203 lb 9.6 oz (92.352 kg) (05/19 2013) Physical Exam: General: conversational with hoarse voice, NAD HEENT: NCAT, EOMI, PERRL, very dry mucous membranes (noted thick mucus adhesions within mouth) Cardiovascular: RRR, no murmurs appreciated  Respiratory: CTAB, limited due to position  Abdomen: NABS, Soft/NT/ND, + ventral hernia (more noticeable with movements), reducible  Extremities: R-shoulder minimal tenderness to palpation, with significantly limited ROM (flexion, abduction) due to pain, +1 B/L Edema, +2 pulses B/L LE  Skin: No rashes, + scattered ecchymosis  Neuro: AAO x 2 (person, location, DOB, but not current  date), some word finding difficulties, no focal deficits, grossly intact muscle strength b/l (except 4/5 weakness due to tenderness with R-shoulder)  Laboratory:  Recent Labs Lab 03/06/2014 1135 03/22/14 0615 03/23/14 0612  WBC 6.1 4.9 7.1  HGB 10.2* 8.1* 7.9*  HCT 30.0* 24.5* 24.9*  PLT 182 154 163    Recent Labs Lab 04/01/2014 1135 03/22/14 0615 03/23/14 0612  NA 139 141 144  K 3.7 3.4* 3.2*  CL 101 107 109  CO2 26 21 22   BUN 13 11 10   CREATININE 0.76 0.71 0.69  CALCIUM 9.1 8.2* 8.3*  PROT 6.9 5.7* 6.1  BILITOT 1.5* 0.8 1.0  ALKPHOS 191* 148* 156*  ALT 17 15 18   AST 47* 46* 44*  GLUCOSE 113* 106* 127*       Recent Labs Lab 04/02/2014 1135  TROPONINI <0.30     Imaging/Diagnostic Tests: CT head - no acute process  X- ray, Thorax - Pacer leads present, no new fx  X-ray Chest - Osseous sclerotic markings but no acute process  X-ray Lumbar - Sclerotic process, no acute findings or fx  EKG - Pacer spike present, unchanged from previous  5/19 R-shoulder Xray IMPRESSION:  No acute fracture or subluxation. Degenerative changes AC joint.  High-riding humeral head suspicious for rotator cuff insufficiency.  Sclerotic foci in humerus and scapula suspicious for metastatic  disease.  ECHO (ordered)   Nobie Putnam, DO 03/24/2014, 12:19 AM PGY-1, [place] Intern pager: [phone], text pages welcome

## 2014-03-25 ENCOUNTER — Other Ambulatory Visit: Payer: BC Managed Care – PPO

## 2014-03-25 ENCOUNTER — Inpatient Hospital Stay (HOSPITAL_COMMUNITY): Payer: Medicare Other

## 2014-03-25 ENCOUNTER — Ambulatory Visit: Payer: BC Managed Care – PPO | Admitting: Hematology and Oncology

## 2014-03-25 ENCOUNTER — Ambulatory Visit: Payer: BC Managed Care – PPO

## 2014-03-25 LAB — CBC WITH DIFFERENTIAL/PLATELET
Basophils Absolute: 0 10*3/uL (ref 0.0–0.1)
Basophils Relative: 0 % (ref 0–1)
EOS ABS: 0.1 10*3/uL (ref 0.0–0.7)
Eosinophils Relative: 1 % (ref 0–5)
HCT: 28 % — ABNORMAL LOW (ref 39.0–52.0)
HEMOGLOBIN: 8.9 g/dL — AB (ref 13.0–17.0)
Lymphocytes Relative: 11 % — ABNORMAL LOW (ref 12–46)
Lymphs Abs: 0.7 10*3/uL (ref 0.7–4.0)
MCH: 31 pg (ref 26.0–34.0)
MCHC: 31.8 g/dL (ref 30.0–36.0)
MCV: 97.6 fL (ref 78.0–100.0)
MONOS PCT: 9 % (ref 3–12)
Monocytes Absolute: 0.6 10*3/uL (ref 0.1–1.0)
NEUTROS ABS: 5.3 10*3/uL (ref 1.7–7.7)
Neutrophils Relative %: 80 % — ABNORMAL HIGH (ref 43–77)
Platelets: 190 10*3/uL (ref 150–400)
RBC: 2.87 MIL/uL — ABNORMAL LOW (ref 4.22–5.81)
RDW: 15 % (ref 11.5–15.5)
WBC: 6.7 10*3/uL (ref 4.0–10.5)

## 2014-03-25 LAB — GLUCOSE, CAPILLARY
Glucose-Capillary: 122 mg/dL — ABNORMAL HIGH (ref 70–99)
Glucose-Capillary: 118 mg/dL — ABNORMAL HIGH (ref 70–99)
Glucose-Capillary: 126 mg/dL — ABNORMAL HIGH (ref 70–99)

## 2014-03-25 LAB — BASIC METABOLIC PANEL
BUN: 12 mg/dL (ref 6–23)
BUN: 13 mg/dL (ref 6–23)
CHLORIDE: 112 meq/L (ref 96–112)
CHLORIDE: 113 meq/L — AB (ref 96–112)
CO2: 22 mEq/L (ref 19–32)
CO2: 23 meq/L (ref 19–32)
CREATININE: 0.67 mg/dL (ref 0.50–1.35)
Calcium: 9.2 mg/dL (ref 8.4–10.5)
Calcium: 9 mg/dL (ref 8.4–10.5)
Creatinine, Ser: 0.65 mg/dL (ref 0.50–1.35)
GFR calc non Af Amer: 82 mL/min — ABNORMAL LOW (ref >90)
GFR calc non Af Amer: 81 mL/min — ABNORMAL LOW (ref >90)
Glucose, Bld: 128 mg/dL — ABNORMAL HIGH (ref 70–99)
Glucose, Bld: 141 mg/dL — ABNORMAL HIGH (ref 70–99)
POTASSIUM: 3.4 meq/L — AB (ref 3.7–5.3)
POTASSIUM: 3.7 meq/L (ref 3.7–5.3)
Sodium: 149 mEq/L — ABNORMAL HIGH (ref 137–147)
Sodium: 148 mEq/L — ABNORMAL HIGH (ref 137–147)

## 2014-03-25 MED ORDER — DEXTROSE 5 % IV SOLN
2.0000 g | INTRAVENOUS | Status: DC
  Administered 2014-03-25 – 2014-03-28 (×4): 2 g via INTRAVENOUS
  Filled 2014-03-25 (×7): qty 2

## 2014-03-25 MED ORDER — POTASSIUM CHLORIDE 20 MEQ/15ML (10%) PO LIQD
40.0000 meq | Freq: Once | ORAL | Status: AC
  Administered 2014-03-25: 40 meq via ORAL
  Filled 2014-03-25: qty 30

## 2014-03-25 MED ORDER — SODIUM CHLORIDE 0.9 % IV SOLN
1250.0000 mg | INTRAVENOUS | Status: DC
  Administered 2014-03-25 – 2014-03-27 (×3): 1250 mg via INTRAVENOUS
  Filled 2014-03-25 (×4): qty 1250

## 2014-03-25 NOTE — Progress Notes (Signed)
PT Cancellation Note  Patient Details Name: Peter [name] MRN: [id number] DOB: Sep 10, 1919   Cancelled Treatment:    Reason Eval/Treat Not Completed: Other (comment) (pt sleeping soundly, did not arouse to verbal/tactile stimuli). Will follow.    Lucile Crater 03/25/2014, 2:16 PM [phone]

## 2014-03-25 NOTE — Progress Notes (Signed)
ANTIBIOTIC CONSULT NOTE - INITIAL  Pharmacy Consult for vancomycin and cefepime Indication: HAP  No Known Allergies  Patient Measurements: Height: 5\' 6"  (167.6 cm) Weight: 204 lb 8 oz (92.761 kg) IBW/kg (Calculated) : 63.8  Vital Signs: Temp: 98.7 F (37.1 C) (05/21 0811) Temp src: Oral (05/21 0811) BP: 147/82 mmHg (05/21 0811) Pulse Rate: 96 (05/21 0811) Intake/Output from previous day: 05/20 0701 - 05/21 0700 In: 1375 [I.V.:1375] Out: -  Intake/Output from this shift: Total I/O In: 220 [P.O.:220] Out: -   Labs:  Recent Labs  03/23/14 0612 03/24/14 1300 03/25/14 0500 03/25/14 1100  WBC 7.1 6.6 6.7  --   HGB 7.9* 8.8* 8.9*  --   PLT 163 146* 190  --   CREATININE 0.69  --   --  0.65   Estimated Creatinine Clearance: 62.8 ml/min (by C-G formula based on Cr of 0.65). No results found for this basename: Letta Median, VANCORANDOM, GENTTROUGH, GENTPEAK, GENTRANDOM, TOBRATROUGH, TOBRAPEAK, TOBRARND, AMIKACINPEAK, AMIKACINTROU, AMIKACIN,  in the last 72 hours   Microbiology: Recent Results (from the past 720 hour(s))  URINE CULTURE     Status: None   Collection Time    03/06/14  9:35 PM      Result Value Ref Range Status   Specimen Description URINE, CLEAN CATCH   Final   Special Requests NONE   Final   Culture  Setup Time     Final   Value: 03/07/2014 01:44     Performed at [facility]     Final   Value: NO GROWTH     Performed at [facility]   Culture     Final   Value: NO GROWTH     Performed at [facility]   Report Status 03/08/2014 FINAL   Final    Medical History: Past Medical History  Diagnosis Date  . Cardiac septal defect     closed self during childhood  . Blood transfusion     colon polypectomy, bleeding requiring transfusion  . Hx of colonic polyps 03/03/2005    adenomatous  . Obstructive sleep apnea     Does not use CPAP  . Anemia   . Diabetes mellitus   . Diverticulosis of colon   .  GERD without esophagitis   . Hyperlipidemia   . Macular degeneration   . Memory loss of   . Osteoarthritis of spine   . Periumbilical hernia   . Psoriasis   . Chronic venous insufficiency   . AV block, complete   . Mobitz (type) II atrioventricular block   . Obesity, unspecified   . Left bundle branch hemiblock   . Pure hypercholesterolemia   . Cardiac pacemaker in situ 01/18/05    1st one inserted 01/18/05. 2nd one inserted 06/06/10.  Marland Kitchen LBBB (left bundle branch block)   . Lower extremity edema     Chronic   . Fluid retention in legs 01/15/2014  . Need for pneumococcal vaccination 01/15/2014  . Prostate cancer     Urologist watchfully waiting    Assessment: 4 YOM admitted 5/17 who today was noted to have increased work of breathing. CXR showes new left basilar infiltrate. WBC normal and patient afebrile. SCr 0.65 but based on patient's age/weight, would estimate CrCl to be ~28mL/min, not the pre-calculated 67mL/min. To send blood and sputum cultures.  Goal of Therapy:  Vancomycin trough level 15-20 mcg/ml  Plan:  1. Vancomycin 1250mg  IV q24h 2. Cefepime 2g IV q24h  3. Follow c/s, de-escalation, LOT, renal function and trough at [facility]. Kelbi Renstrom, PharmD, BCPS Clinical Pharmacist Pager: [phone] 03/25/2014 2:55 PM

## 2014-03-25 NOTE — Progress Notes (Signed)
While transporting a patient across his room. [place] bed exit alarm goes off. Seen patient on the side of bed trying to get up unassisted. By the time I got to the patient's room he was about to slide off the bed.  His feet touching the floor knees bent and his back barely touching the bed. Assisted patient to sit on the floor. Patient is confused to place time and situation. Patient assisted back in the bed. Patient was able to stand with 2 assist. Able to move all extremities. Old bruising noted to left side of buttock and left lower back. Denies pain or discomfort. Md on call made aware of assisted fall around 2130.

## 2014-03-25 NOTE — Progress Notes (Signed)
Family Medicine Teaching Service Daily Progress Note Intern Pager: [phone]  Patient name: Peter [name] Medical record number: [id number] Date of birth: 25-Jan-1919 Age: 80 y.o. Gender: male  Primary Care Provider: Lupita [name], MD Consultants: none Code Status: full  Pt Overview and Major Events to Date:   Assessment and Plan: Peter [name] is a 80 y.o. male presenting with weakness and fall . PMH is significant for prostate Ca and skin Ca, DM, HTN, HLD, Anemia, and Sleep Apnea   # Fall and found down (>48 hours), suspected secondary to generalized weakness and deconditioning in setting of metastatic disease - On admission, CK elevated since improved (580-->350), did not meet dx for rhabdo in absence of UA findings or myalgias. Troponin (neg), EKG (paced rhythm), TSH (nml), mild hypokalemia. - Telemetry, vitals per unit - 2D ECHO (scheduled to be performed 5/21), to r/o CHF or valvular etiology - PT - Recommend SNF - CSW - patient's sister Peter [name]) agreeable to short-term SNF rehab, sent out information to SNFs, status pending, further eval - Patient clinically with capacity to make medical decisions, additionally MMSE (24/30, on 03/23/14) - Also, screened PHQ-9 (score 11), denied depression and declined med therapy  # Hypoxia - Overnight reported hypoxia to 86% on room air, placed on supplemental O2 (4L via [place]) to maintain >90% - Unclear etiology, continue work-up. Concern for potential aspiration. Clinically afebrile, nml WBC, non-focal lung exam - Repeat portable CXR - ECHO - Consider ABG if worsens  # Intermittent Delirium - Patient with increased acting out and combative behavior, with intermittent improved orientation. Evident with reported to be "at home" and increased agitation with increased nursing interventions - Today improved demeanor with nursing, continue re-orientation - Consider Haldol PRN if excessive delirium and potential harm  # Right  shoulder pain, suspected rotator cuff strain and degenerative AC joint  - Reported prior injury (1 month ago) after fall, suspect worsened d/t recent fall. - R-shoulder Xray - No acute frx or sublux. Degen changes to [facility] joint, concern rotator cuff insufficiency - Currently stable, remains neurovascularly intact on exam, intact distal muscle strength, with limited shoulder ROM  # Mild Hypokalemia - Improved - K 3.7-->3.4-->3.2 - s/p K+ PO 40 mEq - BMET in AM  # Mild dysphagia, with poor PO intake - Suspect worsening since admission, with some difficulty with speech. - c/s SLP - MBS (on 5/19) recommend Dysphagia 2 Diet, Nectar thick liquids, continue to follow - Some improvement with feedings per nursing today, with assistance ate apple sauce and half Boost Plus, then stated he was full. Will continue to work with patient. Currently not refusing PO today - Consult Nutrition - poor PO intake, inadequate nutritional needs with suspected behavioral component, tolerating dietary supplements, increase Boost Plus to TID  # Hx of CAD and arrhythmia, pacemaker in place  - Continue home medications   # Hx of Prostate CA, metastatic to bone - Followed by Heme/Onc (Dr. Alvy [name]) - updated during hospitalization, continue current treatment - Unable to continue current Zytiga 500mg  PO (due to not stocked in pharmacy), recommend bring from home to SNF - Continue Prednisone 5mg  PO BID - Shoulder X-ray with suspicious bone mets, consistent with known disease  # Anemia of chronic disease, secondary to Prostate CA - Hgb initially with suspected dilutional drop from 10.2 to 8.1 - stable Hgb with improved 7.9 to 8.8-->8.9 - completed Type & Screen, considered transfusion per Heme/Onc recommendations, plan to hold at this time  # Hx  of Hyperlipidemia  - Hold statin therapy  # DM II  - HgbA1c 5.9 - Sensitive SSI while here, hold metformin   # HTN Hx  - Not on any medications for this at home, monitor  closely   FEN/GI: Dysphagia 2 Diet / Nectar thick liquids, NS @ 125 cc/hr (lost IV access) Prophylaxis: SQ heparin  Disposition: Admitted to [facility] for rehydration and PT/OT/CSW assessment for potential placement. Continues to work with PT/OT, recommend SNF, anticipate to hear from [facility] today (5/21) regarding SNF placement expect to have placement, however now with new concern of hypoxia overnight requiring supplemental oxygen, patient to have further work-up until determined medically stable for SNF.  Subjective: Sleeping, awakens on verbal stimulation, persistent hoarse voice with dry mouth. Denies any new complaints, pain, SOB, nausea, or dizziness. Unable to explain poor appetite and decreased PO intake. Per nursing issues with being combative overnight. Admits he is ready to go to rehab facility.  Objective: Temp:  [97.8 F (36.6 C)-99.1 F (37.3 C)] 98.3 F (36.8 C) (05/20 2127) Pulse Rate:  [90-98] 98 (05/20 2127) Resp:  [20-23] 20 (05/20 2127) BP: (134-159)/(70-84) 144/70 mmHg (05/20 2127) SpO2:  [86 %-98 %] 92 % (05/20 2154) Weight:  [204 lb 8 oz (92.761 kg)] 204 lb 8 oz (92.761 kg) (05/20 2127) Physical Exam: General: cooperative, hoarse voice, NAD HEENT: very dry mucous membranes (noted thick mucus adhesions within mouth) Cardiovascular: RRR, no murmurs appreciated  Respiratory: CTAB, limited due to position  Abdomen: soft, NTND, +active BS  Extremities: R-shoulder minimal tenderness to palpation, with significantly limited ROM (flexion, abduction) due to pain, +1 B/L Edema, +2 pulses B/L LE  Skin: No rashes, + scattered ecchymosis  Neuro: AAO x 2 (person, location, DOB, but not current date), some word finding difficulties, no focal deficits, grossly intact muscle strength b/l (except 4/5 weakness due to tenderness with R-shoulder)  Laboratory:  Recent Labs Lab 03/22/14 0615 03/23/14 0612 03/24/14 1300  WBC 4.9 7.1 6.6  HGB 8.1* 7.9* 8.8*  HCT 24.5* 24.9* 26.5*  PLT  154 163 146*    Recent Labs Lab Mar 30, 2014 1135 03/22/14 0615 03/23/14 0612  NA 139 141 144  K 3.7 3.4* 3.2*  CL 101 107 109  CO2 26 21 22   BUN 13 11 10   CREATININE 0.76 0.71 0.69  CALCIUM 9.1 8.2* 8.3*  PROT 6.9 5.7* 6.1  BILITOT 1.5* 0.8 1.0  ALKPHOS 191* 148* 156*  ALT 17 15 18   AST 47* 46* 44*  GLUCOSE 113* 106* 127*       Recent Labs Lab 2014-03-30 1135  TROPONINI <0.30     Imaging/Diagnostic Tests: CT head - no acute process  X- ray, Thorax - Pacer leads present, no new fx  X-ray Chest - Osseous sclerotic markings but no acute process  X-ray Lumbar - Sclerotic process, no acute findings or fx  EKG - Pacer spike present, unchanged from previous  5/19 R-shoulder Xray IMPRESSION:  No acute fracture or subluxation. Degenerative changes AC joint.  High-riding humeral head suspicious for rotator cuff insufficiency.  Sclerotic foci in humerus and scapula suspicious for metastatic  disease.  ECHO (to be performed 5/21)   Nobie Putnam, DO 03/25/2014, 1:21 AM PGY-1, [place] Intern pager: [phone], text pages welcome

## 2014-03-25 NOTE — Progress Notes (Signed)
Unsuccessful in family bringing in [place]. Patient is not able to take whole medications and Zytiga cannot be crushed.  Spoke with Dr. Skeet Simmer yesterday and we agreed to discontinue the order rather than causing the expense of ordering the medication when it cannot be given at this time.  When patient is able to take whole medications, Zytiga will need to be restarted.  [place] Nick D. Myeasha Ballowe, PharmD, BCPS Clinical Pharmacist 03/25/2014 9:53 AM

## 2014-03-25 NOTE — Progress Notes (Signed)
  Echocardiogram 2D Echocardiogram has been performed.  [place] 03/25/2014, 10:17 AM

## 2014-03-25 NOTE — Progress Notes (Signed)
Daughter made aware of assisted fall. Appreciative of staff.   [name],Peter Daughter [phone]  [phone]

## 2014-03-25 NOTE — Progress Notes (Signed)
FMTS Attending Note Patient seen and examined by me today, discussed with resident team and I agree with Dr Parks Ranger' note for today.  Patient appears to have mild increase in work of breathing and is noted to have some desaturations on pulse oximetry.  CXR performed today with concern for new left basilar infiltrate.  To initiate treatment for HAP.  Patient appears to have an element of delirium today, not oriented to place and has exhibited agitation over the past 2 days.  Dalbert Mayotte, MD

## 2014-03-26 ENCOUNTER — Inpatient Hospital Stay (HOSPITAL_COMMUNITY): Payer: Medicare Other

## 2014-03-26 ENCOUNTER — Encounter (HOSPITAL_COMMUNITY): Payer: Self-pay | Admitting: Anesthesiology

## 2014-03-26 ENCOUNTER — Encounter (HOSPITAL_COMMUNITY): Payer: Self-pay | Admitting: *Deleted

## 2014-03-26 DIAGNOSIS — M7989 Other specified soft tissue disorders: Secondary | ICD-10-CM

## 2014-03-26 DIAGNOSIS — I469 Cardiac arrest, cause unspecified: Secondary | ICD-10-CM

## 2014-03-26 DIAGNOSIS — I517 Cardiomegaly: Secondary | ICD-10-CM

## 2014-03-26 LAB — COMPREHENSIVE METABOLIC PANEL
ALK PHOS: 146 U/L — AB (ref 39–117)
ALT: 24 U/L (ref 0–53)
AST: 55 U/L — ABNORMAL HIGH (ref 0–37)
Albumin: 2.4 g/dL — ABNORMAL LOW (ref 3.5–5.2)
BUN: 17 mg/dL (ref 6–23)
CHLORIDE: 114 meq/L — AB (ref 96–112)
CO2: 20 meq/L (ref 19–32)
Calcium: 8.4 mg/dL (ref 8.4–10.5)
Creatinine, Ser: 1.02 mg/dL (ref 0.50–1.35)
GFR calc Af Amer: 71 mL/min — ABNORMAL LOW (ref >90)
GFR, EST NON AFRICAN AMERICAN: 62 mL/min — AB (ref >90)
GLUCOSE: 143 mg/dL — AB (ref 70–99)
Potassium: 4.3 mEq/L (ref 3.7–5.3)
SODIUM: 148 meq/L — AB (ref 137–147)
TOTAL PROTEIN: 5.8 g/dL — AB (ref 6.0–8.3)
Total Bilirubin: 0.6 mg/dL (ref 0.3–1.2)

## 2014-03-26 LAB — GLUCOSE, CAPILLARY
GLUCOSE-CAPILLARY: 148 mg/dL — AB (ref 70–99)
GLUCOSE-CAPILLARY: 157 mg/dL — AB (ref 70–99)
Glucose-Capillary: 147 mg/dL — ABNORMAL HIGH (ref 70–99)
Glucose-Capillary: 150 mg/dL — ABNORMAL HIGH (ref 70–99)
Glucose-Capillary: 143 mg/dL — ABNORMAL HIGH (ref 70–99)
Glucose-Capillary: 139 mg/dL — ABNORMAL HIGH (ref 70–99)

## 2014-03-26 LAB — CBC WITH DIFFERENTIAL/PLATELET
BASOS PCT: 1 % (ref 0–1)
Basophils Absolute: 0.1 10*3/uL (ref 0.0–0.1)
EOS ABS: 0.1 10*3/uL (ref 0.0–0.7)
Eosinophils Relative: 1 % (ref 0–5)
HEMATOCRIT: 29.3 % — AB (ref 39.0–52.0)
HEMOGLOBIN: 9.1 g/dL — AB (ref 13.0–17.0)
Lymphocytes Relative: 15 % (ref 12–46)
Lymphs Abs: 1.3 10*3/uL (ref 0.7–4.0)
MCH: 31.1 pg (ref 26.0–34.0)
MCHC: 31.1 g/dL (ref 30.0–36.0)
MCV: 100 fL (ref 78.0–100.0)
MONO ABS: 0.8 10*3/uL (ref 0.1–1.0)
MONOS PCT: 9 % (ref 3–12)
Neutro Abs: 6.5 10*3/uL (ref 1.7–7.7)
Neutrophils Relative %: 75 % (ref 43–77)
Platelets: 265 10*3/uL (ref 150–400)
RBC: 2.93 MIL/uL — ABNORMAL LOW (ref 4.22–5.81)
RDW: 15.3 % (ref 11.5–15.5)
WBC: 8.7 10*3/uL (ref 4.0–10.5)

## 2014-03-26 LAB — URINE MICROSCOPIC-ADD ON

## 2014-03-26 LAB — URINALYSIS, ROUTINE W REFLEX MICROSCOPIC
Bilirubin Urine: NEGATIVE
GLUCOSE, UA: NEGATIVE mg/dL
Hgb urine dipstick: NEGATIVE
KETONES UR: 15 mg/dL — AB
Leukocytes, UA: NEGATIVE
Nitrite: NEGATIVE
Protein, ur: 30 mg/dL — AB
Specific Gravity, Urine: 1.03 (ref 1.005–1.030)
UROBILINOGEN UA: 0.2 mg/dL (ref 0.0–1.0)
pH: 6 (ref 5.0–8.0)

## 2014-03-26 LAB — HEPARIN LEVEL (UNFRACTIONATED): HEPARIN UNFRACTIONATED: 0.42 [IU]/mL (ref 0.30–0.70)

## 2014-03-26 LAB — POCT I-STAT 3, ART BLOOD GAS (G3+)
Acid-base deficit: 5 mmol/L — ABNORMAL HIGH (ref 0.0–2.0)
BICARBONATE: 21.3 meq/L (ref 20.0–24.0)
O2 SAT: 100 %
PO2 ART: 183 mmHg — AB (ref 80.0–100.0)
Patient temperature: 97.5
TCO2: 23 mmol/L (ref 0–100)
pCO2 arterial: 40.9 mmHg (ref 35.0–45.0)
pH, Arterial: 7.322 — ABNORMAL LOW (ref 7.350–7.450)

## 2014-03-26 LAB — MRSA PCR SCREENING: MRSA BY PCR: NEGATIVE

## 2014-03-26 LAB — TROPONIN I
TROPONIN I: 2.43 ng/mL — AB (ref <0.30)
Troponin I: 3.95 ng/mL (ref <0.30)

## 2014-03-26 LAB — MAGNESIUM: Magnesium: 1.8 mg/dL (ref 1.5–2.5)

## 2014-03-26 LAB — STREP PNEUMONIAE URINARY ANTIGEN: STREP PNEUMO URINARY ANTIGEN: NEGATIVE

## 2014-03-26 LAB — LACTIC ACID, PLASMA: Lactic Acid, Venous: 4.6 mmol/L — ABNORMAL HIGH (ref 0.5–2.2)

## 2014-03-26 LAB — HIV ANTIBODY (ROUTINE TESTING W REFLEX): HIV 1&2 Ab, 4th Generation: NONREACTIVE

## 2014-03-26 LAB — D-DIMER, QUANTITATIVE: D-Dimer, Quant: 17.85 ug/mL-FEU — ABNORMAL HIGH (ref 0.00–0.48)

## 2014-03-26 MED ORDER — FENTANYL CITRATE 0.05 MG/ML IJ SOLN
50.0000 ug | INTRAMUSCULAR | Status: DC | PRN
  Administered 2014-03-26 – 2014-03-29 (×6): 50 ug via INTRAVENOUS
  Filled 2014-03-26 (×6): qty 2

## 2014-03-26 MED ORDER — HYDROCORTISONE NA SUCCINATE PF 100 MG IJ SOLR
50.0000 mg | Freq: Four times a day (QID) | INTRAMUSCULAR | Status: DC
  Administered 2014-03-26 (×3): 50 mg via INTRAVENOUS
  Administered 2014-03-27: 14:00:00 via INTRAVENOUS
  Administered 2014-03-27 – 2014-03-28 (×5): 50 mg via INTRAVENOUS
  Filled 2014-03-26 (×13): qty 1

## 2014-03-26 MED ORDER — INSULIN ASPART 100 UNIT/ML ~~LOC~~ SOLN
0.0000 [IU] | SUBCUTANEOUS | Status: DC
  Administered 2014-03-26: 1 [IU] via SUBCUTANEOUS
  Administered 2014-03-26: 2 [IU] via SUBCUTANEOUS
  Administered 2014-03-27 (×5): 1 [IU] via SUBCUTANEOUS
  Administered 2014-03-27 – 2014-03-28 (×4): 2 [IU] via SUBCUTANEOUS
  Administered 2014-03-28: 08:00:00 via SUBCUTANEOUS
  Administered 2014-03-28 (×2): 2 [IU] via SUBCUTANEOUS
  Administered 2014-03-29 – 2014-03-30 (×7): 3 [IU] via SUBCUTANEOUS
  Administered 2014-03-30: 2 [IU] via SUBCUTANEOUS
  Administered 2014-03-30 (×3): 3 [IU] via SUBCUTANEOUS
  Administered 2014-03-30 – 2014-03-31 (×2): 2 [IU] via SUBCUTANEOUS
  Administered 2014-03-31 (×2): 3 [IU] via SUBCUTANEOUS
  Administered 2014-03-31: 2 [IU] via SUBCUTANEOUS
  Administered 2014-03-31 (×2): 3 [IU] via SUBCUTANEOUS
  Administered 2014-04-01 – 2014-04-02 (×7): 2 [IU] via SUBCUTANEOUS
  Administered 2014-04-02: 3 [IU] via SUBCUTANEOUS
  Administered 2014-04-02 – 2014-04-04 (×11): 2 [IU] via SUBCUTANEOUS
  Administered 2014-04-04: 1 [IU] via SUBCUTANEOUS
  Administered 2014-04-04 (×3): 2 [IU] via SUBCUTANEOUS
  Administered 2014-04-04: 1 [IU] via SUBCUTANEOUS
  Administered 2014-04-05 (×4): 2 [IU] via SUBCUTANEOUS
  Administered 2014-04-05: 1 [IU] via SUBCUTANEOUS
  Administered 2014-04-05: 3 [IU] via SUBCUTANEOUS
  Administered 2014-04-06 – 2014-04-07 (×7): 1 [IU] via SUBCUTANEOUS
  Administered 2014-04-07: 2 [IU] via SUBCUTANEOUS
  Administered 2014-04-07 (×2): 1 [IU] via SUBCUTANEOUS
  Administered 2014-04-07: 2 [IU] via SUBCUTANEOUS
  Administered 2014-04-07 (×2): 1 [IU] via SUBCUTANEOUS
  Administered 2014-04-08: 2 [IU] via SUBCUTANEOUS
  Administered 2014-04-08: 1 [IU] via SUBCUTANEOUS

## 2014-03-26 MED ORDER — HEPARIN (PORCINE) IN NACL 100-0.45 UNIT/ML-% IJ SOLN
1300.0000 [IU]/h | INTRAMUSCULAR | Status: DC
  Administered 2014-03-26: 1300 [IU]/h via INTRAVENOUS
  Filled 2014-03-26 (×3): qty 250

## 2014-03-26 MED ORDER — PANTOPRAZOLE SODIUM 40 MG IV SOLR
40.0000 mg | INTRAVENOUS | Status: DC
  Administered 2014-03-26: 40 mg via INTRAVENOUS
  Filled 2014-03-26 (×2): qty 40

## 2014-03-26 MED ORDER — PHENYLEPHRINE HCL 10 MG/ML IJ SOLN
30.0000 ug/min | INTRAVENOUS | Status: DC
  Administered 2014-03-26: 80 ug/min via INTRAVENOUS
  Filled 2014-03-26 (×2): qty 2

## 2014-03-26 MED ORDER — HEPARIN BOLUS VIA INFUSION
4000.0000 [IU] | Freq: Once | INTRAVENOUS | Status: AC
  Administered 2014-03-26: 4000 [IU] via INTRAVENOUS
  Filled 2014-03-26: qty 4000

## 2014-03-26 MED ORDER — IOHEXOL 350 MG/ML SOLN
100.0000 mL | Freq: Once | INTRAVENOUS | Status: AC | PRN
  Administered 2014-03-26: 100 mL via INTRAVENOUS

## 2014-03-26 MED ORDER — NOREPINEPHRINE BITARTRATE 1 MG/ML IV SOLN
2.0000 ug/min | INTRAVENOUS | Status: DC
  Administered 2014-03-26: 10 ug/min via INTRAVENOUS
  Filled 2014-03-26 (×2): qty 8

## 2014-03-26 MED ORDER — CHLORHEXIDINE GLUCONATE 0.12 % MT SOLN
15.0000 mL | Freq: Two times a day (BID) | OROMUCOSAL | Status: DC
  Administered 2014-03-26 – 2014-04-11 (×33): 15 mL via OROMUCOSAL
  Filled 2014-03-26 (×33): qty 15

## 2014-03-26 MED ORDER — SODIUM CHLORIDE 0.9 % IV BOLUS (SEPSIS)
1000.0000 mL | Freq: Once | INTRAVENOUS | Status: AC
  Administered 2014-03-26: 1000 mL via INTRAVENOUS

## 2014-03-26 MED ORDER — MIDAZOLAM HCL 2 MG/2ML IJ SOLN: 1.0000 mg | INTRAMUSCULAR | Status: DC | PRN

## 2014-03-26 MED ORDER — HEPARIN SODIUM (PORCINE) 5000 UNIT/ML IJ SOLN
5000.0000 [IU] | Freq: Three times a day (TID) | INTRAMUSCULAR | Status: DC
Start: 2014-03-26 — End: 2014-03-30
  Administered 2014-03-26 – 2014-03-30 (×11): 5000 [IU] via SUBCUTANEOUS
  Filled 2014-03-26 (×14): qty 1

## 2014-03-26 MED ORDER — BIOTENE DRY MOUTH MT LIQD
15.0000 mL | Freq: Four times a day (QID) | OROMUCOSAL | Status: DC
  Administered 2014-03-26 – 2014-04-11 (×66): 15 mL via OROMUCOSAL

## 2014-03-26 MED FILL — Medication: Qty: 1 | Status: AC

## 2014-03-26 NOTE — Progress Notes (Signed)
NUTRITION FOLLOW UP  Intervention:    If unable to extubate within the next 24 hours and supportive care to continue, recommend start TF with 64M PEPuP Protocol: initiate TF via OGT with Vital High Protein at 25 ml/h on day 1; on day 2, increase to goal rate of 60 ml/h (1440 ml per day) to provide 1440 kcals (23 kcals/kg ideal weight), 126 gm protein, 1204 ml free water daily.  New Nutrition Dx:  Inadequate oral intake related to inability to eat as evidenced by NPO status. Ongoing.  New Goal:   Enteral nutrition to provide 60-70% of estimated calorie needs (22-25 kcals/kg ideal body weight) and 100% of estimated protein needs, based on ASPEN guidelines for permissive underfeeding in critically ill obese individuals  Monitor:   TF tolerance/adequacy, weight trend, labs, vent status.  Assessment:   PMHx significant for squamous cell CA, osteoporosis, HLD. Admitted s/p fall down x 4 days. Work-up ongoing.  Patient previously on dysphagia 2 diet with nectar thick liquids with very poor intake of meals. Coded this AM and transferred to the MICU.   Patient is currently intubated on ventilator support MV: 7.3 L/min Temp (24hrs), Avg:98.5 F (36.9 C), Min:97.5 F (36.4 C), Max:100 F (37.8 C)   Height: Ht Readings from Last 1 Encounters:  03/26/14 5\' 5"  (1.651 m)    Weight Status:   Wt Readings from Last 1 Encounters:  03/26/14 213 lb 3 oz (96.7 kg)  03/23/14  203 lb 9.6 oz (92.352 kg)  04/08/14 195 lb 5.2 oz (88.6 kg)  BMI=32.5 (using admission weight)  Re-estimated needs:  Kcal: 1740 Protein: 124 gm Fluid: 1.7-2 L2  Skin: no wounds  Diet Order:  NPO   Intake/Output Summary (Last 24 hours) at 03/26/14 1251 Last data filed at 03/26/14 1200  Gross per 24 hour  Intake 5110.11 ml  Output      0 ml  Net 5110.11 ml    Last BM: 5/20   Labs:   Recent Labs Lab 03/25/14 1100 03/25/14 1958 03/26/14 0719  NA 149* 148* 148*  K 3.4* 3.7 4.3  CL 112 113* 114*  CO2  22 23 20   BUN 12 13 17   CREATININE 0.65 0.67 1.02  CALCIUM 9.2 9.0 8.4  MG  --   --  1.8  GLUCOSE 128* 141* 143*    CBG (last 3)   Recent Labs  03/26/14 0712 03/26/14 0810 03/26/14 1200  GLUCAP 150* 143* 139*    Scheduled Meds: . antiseptic oral rinse  15 mL Mouth Rinse QID  . calcium-vitamin D  1 tablet Oral Q breakfast  . ceFEPime (MAXIPIME) IV  2 g Intravenous Q24H  . chlorhexidine  15 mL Mouth Rinse BID  . hydrocortisone sod succinate (SOLU-CORTEF) inj  50 mg Intravenous Q6H  . insulin aspart  0-9 Units Subcutaneous 6 times per day  . pantoprazole (PROTONIX) IV  40 mg Intravenous Q24H  . sodium chloride  3 mL Intravenous Q12H  . vancomycin  1,250 mg Intravenous Q24H    Continuous Infusions: . sodium chloride 125 mL/hr at 03/26/14 0124  . heparin 1,300 Units/hr (03/26/14 0952)  . norepinephrine (LEVOPHED) Adult infusion 20 mcg/min (03/26/14 1133)  . phenylephrine (NEO-SYNEPHRINE) Adult infusion Stopped (03/26/14 1047)    Molli Barrows, RD, LDN, [facility] Pager [phone] After Hours Pager [phone]

## 2014-03-26 NOTE — Clinical Social Work Psychosocial (Signed)
Clinical Social Work Department BRIEF PSYCHOSOCIAL ASSESSMENT 03/26/2014  Patient:  Peter [name], Peter [name]     Account Number:  [id number]     Admit date:  04/20/2014  Clinical Social Worker:  Frederico Hamman  Date/Time:  03/26/2014 10:55 AM  Referred by:  Physician  Date Referred:  2014/04/20 Referred for  SNF Placement   Other Referral:   Interview type:  Family Other interview type:   CSW talked with patient's daughter, Peter [name]. She lives at [facility] [phone]; (h) [phone].    PSYCHOSOCIAL DATA Living Status:  ALONE Admitted from facility:   Level of care:   Primary support name:  Peter [name] Primary support relationship to patient:  CHILD, ADULT Degree of support available:   Patient also has a granddaughter Peter [name] -[phone] (h) and [phone] (w).    CURRENT CONCERNS Current Concerns  Post-Acute Placement   Other Concerns:    SOCIAL WORK ASSESSMENT / PLAN On 03/22/14 CSW talked with daughter by phone [phone] regarding discharge planning (patient asleep when CSW visited room). Daughter informed that short-term rehab has been recommended and that patient will probably need her help in choosing a facility. Daughter commented that she has been to rehab before at [facility] and the therapy was good there. Ms. Grudzien feels St rehab for patient is a good idea. CSW explained process and informed daughter that she will be recontacted with facility responses. Daughter also informed that CSW will also f/u with patient regarding rehab.   Assessment/plan status:  Psychosocial Support/Ongoing Assessment of Needs Other assessment/ plan:   Information/referral to community resources:    PATIENT'S/FAMILY'S RESPONSE TO PLAN OF CARE: Daughter - Peter [name] was open to talking with CSW and is  in agreement with short-term rehab for her father.

## 2014-03-26 NOTE — Progress Notes (Signed)
Family Medicine Teaching Service Daily Progress Note Intern Pager: [phone]  Patient name: Peter [name] Medical record number: [id number] Date of birth: 1919/07/15 Age: 80 y.o. Gender: male  Primary Care Provider: Lupita [name], MD Consultants: PCCM (primary) Code Status: Full (per patient's daughter, to be further discussed)  Pt Overview and Major Events to Date:   5/17 - Admitted general weakness, PT/OT, anticipate placement 5/18 - Poor PO intake, SLP eval, dysphagia 2 diet, thickened liquid 5/20 - O2 desat o/n with new O2 req 4L [place], episodes of delirium 5/21 - Repeat Portable CXR with new Left basilar infiltrate, considered HAP, started Vanc/Cefepime 5/22 - Code Blue - 1761 PEA, successfully resuscitated with CPR and Epi, intubated and transferred to ICU  Assessment and Plan: Peter [name] is a 80 y.o. male presenting with weakness and fall, with poor functional status and decreased PO intake, found to have hypoxia with new dx HAP (new O2 req 4L). Subsequently Coded PEA arrest, required intubation and transfer to ICU. PMH is significant for metastatic prostate Ca and skin Ca, DM, HTN, HLD, Anemia, and Sleep Apnea  Interval Hospital Course: Patient presented on admission after found down >48 hours from fall, with generalized weakness and unable to get up. Initial work-up with elevated CK 572 (no criteria for Rhabdo), without UA findings of myoglobin or significant myalgias, Troponin (neg), LA (1.57), EKG (paced rhythm), ECHO (EF 45-50%), TSH (nml), mild HypoK, otherwise labs stable. Head CT without acute findings, noted stable atrophy small vessel disease, and paranasal sinus disease bilateral, additional Xray survey without acute fracture or findings, notable for sclerotic lesions c/w metastatic prostate CA. Admitted to telemetry for monitoring, IV rehydration. Consulted PT/OT/CSW recommended SNF placement, patient and sister both agreeable, patient deemed to have capacity for  medical decisions, confirmed MMSE (24/30 on 03/23/14). Consulted nutrition and SLP for swallow eval, continued dysphagia 2 diet / thickened liquids, continued poor PO intake, high risk aspiration. Overall, suspected that generalized weakness due to deconditioning, likely primary etiology is metastatic prostate cancer to bone. Anticipated discharge to SNF for short-term rehab, however significant events with change in hospital course and disposition. 5/20 - O2 desat o/n with new O2 req 4L [place], episodes of delirium 5/21 - Repeat Portable CXR with new Left basilar infiltrate, considered HAP, started Vanc/Cefepime 5/22 - Code Blue - 6073 PEA, successfully resuscitated with CPR and Epi, intubated and transferred to ICU  Continued management by ICU-PCCM (primary team), greatly appreciate the excellent care of our patient.  Brief summary of ICU course, A&P: - Acute Respiratory Failure, secondary to cardiac arrest (s/p PEA Code), required intubation - Work-up for potential VTE - Pressor support, volume replacement - Continue antibiotics for HAP (dx 5/21) - At risk for anoxic encephalopathy, work-up Head CT  Disposition: Admitted to ICU 5/22 s/p Code Blue. Continue ICU management at this time.  Subjective: Reported Code Blue this AM, intubated, transferred to ICU. Patient currently on mechanical ventilation and not responsive.  Objective: Temp:  [97.5 F (36.4 C)-98.6 F (37 C)] 97.5 F (36.4 C) (05/22 0831) Pulse Rate:  [74-103] 94 (05/22 1030) Resp:  [14-36] 21 (05/22 1030) BP: (53-140)/(14-73) 114/50 mmHg (05/22 1015) SpO2:  [92 %-100 %] 100 % (05/22 1030) FiO2 (%):  [80 %-100 %] 80 % (05/22 1030) Weight:  [208 lb 15.9 oz (94.8 kg)-213 lb 3 oz (96.7 kg)] 213 lb 3 oz (96.7 kg) (05/22 0800) Physical Exam: General: minimally responsive, on ventilator without sedation  Laboratory:  Recent Labs Lab 03/24/14 1300  03/25/14 0500 03/26/14 0550  WBC 6.6 6.7 8.7  HGB 8.8* 8.9* 9.1*  HCT 26.5*  28.0* 29.3*  PLT 146* 190 265    Recent Labs Lab 03/22/14 0615 03/23/14 0612 03/25/14 1100 03/25/14 1958 03/26/14 0719  NA 141 144 149* 148* 148*  K 3.4* 3.2* 3.4* 3.7 4.3  CL 107 109 112 113* 114*  CO2 21 22 22 23 20   BUN 11 10 12 13 17   CREATININE 0.71 0.69 0.65 0.67 1.02  CALCIUM 8.2* 8.3* 9.2 9.0 8.4  PROT 5.7* 6.1  --   --  5.8*  BILITOT 0.8 1.0  --   --  0.6  ALKPHOS 148* 156*  --   --  146*  ALT 15 18  --   --  24  AST 46* 44*  --   --  55*  GLUCOSE 106* 127* 128* 141* 143*       Recent Labs Lab 04/05/14 1135 03/26/14 0719  TROPONINI <0.30 2.43*   Peter Putnam, DO 03/26/2014, 11:57 AM PGY-1, [place] Intern pager: [phone], text pages welcome

## 2014-03-26 NOTE — Progress Notes (Signed)
ANTICOAGULATION CONSULT NOTE - Initial Consult  Pharmacy Consult for heparin Indication: rule out PE  No Known Allergies  Patient Measurements: Height: 5\' 5"  (165.1 cm) Weight: 213 lb 3 oz (96.7 kg) IBW/kg (Calculated) : 61.5 Heparin Dosing Weight: 82.7 kg  Vital Signs: Temp: 97.5 F (36.4 C) (05/22 0831) Temp src: Oral (05/22 0831) BP: 89/72 mmHg (05/22 0900) Pulse Rate: 100 (05/22 0900)  Labs:  Recent Labs  03/24/14 1300 03/25/14 0500 03/25/14 1100 03/25/14 1958 03/26/14 0550  HGB 8.8* 8.9*  --   --  9.1*  HCT 26.5* 28.0*  --   --  29.3*  PLT 146* 190  --   --  265  CREATININE  --   --  0.65 0.67  --     Estimated Creatinine Clearance: 63 ml/min (by C-G formula based on Cr of 0.67).   Medical History: Past Medical History  Diagnosis Date  . Cardiac septal defect     closed self during childhood  . Blood transfusion     colon polypectomy, bleeding requiring transfusion  . Hx of colonic polyps 03/03/2005    adenomatous  . Obstructive sleep apnea     Does not use CPAP  . Anemia   . Diabetes mellitus   . Diverticulosis of colon   . GERD without esophagitis   . Hyperlipidemia   . Macular degeneration   . Memory loss of   . Osteoarthritis of spine   . Periumbilical hernia   . Psoriasis   . Chronic venous insufficiency   . AV block, complete   . Mobitz (type) II atrioventricular block   . Obesity, unspecified   . Left bundle branch hemiblock   . Pure hypercholesterolemia   . Cardiac pacemaker in situ 01/18/05    1st one inserted 01/18/05. 2nd one inserted 06/06/10.  Marland Kitchen LBBB (left bundle branch block)   . Lower extremity edema     Chronic   . Fluid retention in legs 01/15/2014  . Need for pneumococcal vaccination 01/15/2014  . Prostate cancer     Urologist watchfully waiting    Medications:  Heparin gtt  Assessment: 80 year old male with history of metastatic prostate cancer, being treated for HCAP/pleural effusion, was transferred to ICU this  morning after developing PEA.  Starting heparin for rule-out PE. CBC stable, no bleeding.  Goal of Therapy:  Heparin level 0.3-0.7 units/ml Monitor platelets by anticoagulation protocol: Yes   Plan:  Heparin bolus 4000 units x1 Heparin gtt 1300 units/hr HL ~1800 Daily HL, CBC F/u chest CT, V/Q for rule out PE Monitor for bleeding   Hughes Better, PharmD, BCPS Clinical Pharmacist Pager: [phone] 03/26/2014 9:21 AM

## 2014-03-26 NOTE — Progress Notes (Signed)
  Echocardiogram 2D Echocardiogram has been performed.  Peter [name] 03/26/2014, 12:27 PM

## 2014-03-26 NOTE — Progress Notes (Signed)
SLP Cancellation Note  Patient Details Name: Peter [name] MRN: [id number] DOB: [birth year]   Cancelled treatment:       Reason Eval/Treat Not Completed: Medical issues which prohibited therapy. Pt now on vent, will sign off for now. Please reorder when ready for PO intake again.   [name] 03/26/2014, 7:41 AM

## 2014-03-26 NOTE — Clinical Social Work Placement (Addendum)
Clinical Social Work Department CLINICAL SOCIAL WORK PLACEMENT NOTE 03/26/2014  Patient:  GREIG, ALTERGOTT  Account Number:  [id number] Admit date:  2014-04-08  Clinical Social Worker:  Winefred Hillesheim Givens, LCSW  Date/time:  03/26/2014 11:20 AM  Clinical Social Work is seeking post-discharge placement for this patient at the following level of care:   SKILLED NURSING   (*CSW will update this form in Epic as items are completed)     Patient/family provided with [place] Department of Clinical Social Work's list of facilities offering this level of care within the geographic area requested by the patient (or if unable, by the patient's family).  03/22/2014  Patient/family informed of their freedom to choose among providers that offer the needed level of care, that participate in Medicare, Medicaid or managed care program needed by the patient, have an available bed and are willing to accept the patient.    Patient/family informed of [facility]' ownership interest in [facility], as well as of the fact that they are under no obligation to receive care at this facility.  PASARR submitted to EDS on 01/31/14 PASARR number received from EDS on 01/31/14 - [phone] [id number]  FL2 transmitted to all facilities in geographic area requested by pt/family on  03/22/2014 FL2 transmitted to all facilities within larger geographic area on   Patient informed that his/her managed care company has contracts with or will negotiate with  certain facilities, including the following:     Patient/family informed of bed offers received:  03/24/2014 Patient chooses bed at [facility] Physician recommends and patient chooses bed at    Patient to be transferred to [facility] on   Patient to be transferred to facility by   The following physician request were entered in Epic:  Additional Comments: 03/26/14:  CSW contacted [place], admissions staff person with [facility]  to inform them that family interested in facility. She was also advised that patient not yet medically stable for d/c and has been transferred to an intensive care unit.

## 2014-03-26 NOTE — Procedures (Signed)
Arterial Catheter Insertion Procedure Note Peter [name] [id number] [birth year]  Procedure: Insertion of Arterial Catheter  Indications: Blood pressure monitoring  Procedure Details Consent: Unable to obtain consent because of emergent medical necessity. Time Out: Verified patient identification, verified procedure, site/side was marked, verified correct patient position, special equipment/implants available, medications/allergies/relevent history reviewed, required imaging and test results available.  Performed  Maximum sterile technique was used including antiseptics, cap, gloves, gown, hand hygiene, mask and sheet. Skin prep: Chlorhexidine; local anesthetic administered 20 gauge catheter was inserted into left radial artery using the Seldinger technique.  Evaluation Blood flow good; BP tracing good. Complications: No apparent complications.    Raelyn Number [facility] 03/26/2014

## 2014-03-26 NOTE — Progress Notes (Signed)
ANTICOAGULATION CONSULT NOTE - Initial Consult  Pharmacy Consult for heparin Indication: rule out PE  No Known Allergies  Patient Measurements: Height: 5\' 5"  (165.1 cm) Weight: 213 lb 3 oz (96.7 kg) IBW/kg (Calculated) : 61.5 Heparin Dosing Weight: 82.7 kg  Vital Signs: Temp: 99.3 F (37.4 C) (05/22 1634) Temp src: Oral (05/22 1634) BP: 94/47 mmHg (05/22 1700) Pulse Rate: 70 (05/22 1700)  Labs:  Recent Labs  03/24/14 1300 03/25/14 0500 03/25/14 1100 03/25/14 1958 03/26/14 0550 03/26/14 0719 03/26/14 1600  HGB 8.8* 8.9*  --   --  9.1*  --   --   HCT 26.5* 28.0*  --   --  29.3*  --   --   PLT 146* 190  --   --  265  --   --   HEPARINUNFRC  --   --   --   --   --   --  0.42  CREATININE  --   --  0.65 0.67  --  1.02  --   TROPONINI  --   --   --   --   --  2.43*  --     Estimated Creatinine Clearance: 49.4 ml/min (by C-G formula based on Cr of 1.02).  Medications:  Heparin gtt  Assessment: 80 year old male with history of metastatic prostate cancer, being treated for HCAP/pleural effusion, was transferred to ICU this morning after developing PEA.  Starting heparin for rule-out PE. First heparin level is therapeutic at 0.42  Goal of Therapy:  Heparin level 0.3-0.7 units/ml Monitor platelets by anticoagulation protocol: Yes   Plan:  Continue Heparin gtt 1300 units/hr Daily HL, CBC F/u chest CT, V/Q for rule out PE Monitor for bleeding  Heide Guile, PharmD, BCPS Clinical Pharmacist Pager [phone]    03/26/2014 6:03 PM

## 2014-03-26 NOTE — Progress Notes (Signed)
Chaplain received referral from patient's RN. Met with patient's daughter, Fraser Din, who expressed "shock and concern" that she just spoke with her father yesterday and "now we're here." Present when physician spoke with daughter. Chaplain provided emotional support through empathic listening and pastoral presence. She appreciated support. Please page on call chaplain over the weekend for additional emotional support.   Ethelene Browns [phone]

## 2014-03-26 NOTE — Progress Notes (Signed)
CRITICAL VALUE ALERT  Critical value received:  Triponin 2.43  Date of notification:  03/26/2014  Time of notification:  6720  Critical value read back:yes  Nurse who received alert:  Mavis Nyako/Carliss Porcaro  MD notified (1st page):  Dr Elsworth Soho  Time of first page:  1015  MD notified (2nd page):  Time of second page:  Responding MD:  Dr. Elsworth Soho  Time MD responded:  1030

## 2014-03-26 NOTE — Procedures (Signed)
Central Venous Catheter Insertion Procedure Note Peter [name] [id number] Mar 25, 1919  Procedure: Insertion of Central Venous Catheter Indications: Assessment of intravascular volume, Drug and/or fluid administration and Frequent blood sampling  Procedure Details Consent: Risks of procedure as well as the alternatives and risks of each were explained to the (patient/caregiver).  Consent for procedure obtained. Time Out: Verified patient identification, verified procedure, site/side was marked, verified correct patient position, special equipment/implants available, medications/allergies/relevent history reviewed, required imaging and test results available.  Performed  Maximum sterile technique was used including antiseptics, cap, gloves, gown, hand hygiene, mask and sheet. Skin prep: Chlorhexidine; local anesthetic administered A antimicrobial bonded/coated triple lumen catheter was placed in the left subclavian vein using the Seldinger technique.  Evaluation Blood flow good Complications: No apparent complications Patient did tolerate procedure well. Chest X-ray ordered to verify placement.  CXR: pending.  [place] used for site verification  Rigoberto Noel 03/26/2014, 9:02 AM

## 2014-03-26 NOTE — Progress Notes (Signed)
[facility] Progress Note Patient Name: Peter [name] DOB: September 11, 1919 MRN: [id number]  Date of Service  03/26/2014   HPI/Events of Note   CT chest negative for PE, and doppler legs negative for DVT.   eICU Interventions  Will d/c heparin gtt, and change to SQ heparin.   Intervention Category Major Interventions: Respiratory failure - evaluation and management  Ferron Ishmael 03/26/2014, 10:49 PM

## 2014-03-26 NOTE — Progress Notes (Signed)
VASCULAR LAB PRELIMINARY  PRELIMINARY  PRELIMINARY  PRELIMINARY  Bilateral lower extremity venous duplex  completed.    Preliminary report:  Bilateral:  No evidence of DVT, superficial thrombosis, or Baker's Cyst.    Nani Ravens, RVT 03/26/2014, 10:37 AM

## 2014-03-26 NOTE — Progress Notes (Signed)
Physical Therapy Discharge Patient Details Name: Peter [name] MRN: [id number] DOB: 04/25/1919 Today's Date: 03/26/2014 Time:  -     Patient discharged from PT services secondary to medical decline - will need to re-order PT to resume therapy services.  Pt. Coded this am and has been transferred to 2M08.  Please reorder PT if/when this becomes appropriate.  Thank you!  Please see latest therapy progress note for current level of functioning and progress toward goals.      [place] [phone] Beeper [phone]  03/26/2014, 8:50 AM

## 2014-03-26 NOTE — Progress Notes (Signed)
Received pt s/p code from floor RT via bagging on 100% fio2.  Placed pt on vent 17ml/kg, rate 14 until MD can eval pt and give vent orders.  BBSH = clear coarse.

## 2014-03-26 NOTE — Consult Note (Signed)
PULMONARY / CRITICAL CARE MEDICINE   Name: Peter [name] MRN: [id number] DOB: 06/25/1919    ADMISSION DATE:  03/22/2014 CONSULTATION DATE:  03/26/2014  REFERRING MD :  Gevena Cotton PRIMARY SERVICE: IMTS  CHIEF COMPLAINT:  PEA arrest  BRIEF PATIENT DESCRIPTION: 80 y.o with metastatic prostate cancer adm 5/17 with left HCAP/effusion, was on 4L [place] , transferred to ICU after PEA arrest on 5/22, 8 min downtime, epi x 2 PMH - metastatic prostate CA, CAD with ventricular pacer, HTN, HLD, chronic anemia    SIGNIFICANT EVENTS / STUDIES:  5/22 PEA arrest  LINES / TUBES: ETT 5/22  CULTURES: resp 5/22 >>  ANTIBIOTICS: 5/21 cefepime >> 5/21 vanc >>  HISTORY OF PRESENT ILLNESS:  Presented with fall & found down, stated lost balance, Hb 8 on adm, LLL infx noted, wallow evaln recomm dys 2 diet, PSA was 1300 down to 300, dc to SNF was planned, developed hypoxia overnight  PAST MEDICAL HISTORY :  Past Medical History  Diagnosis Date  . Cardiac septal defect     closed self during childhood  . Blood transfusion     colon polypectomy, bleeding requiring transfusion  . Hx of colonic polyps 03/03/2005    adenomatous  . Obstructive sleep apnea     Does not use CPAP  . Anemia   . Diabetes mellitus   . Diverticulosis of colon   . GERD without esophagitis   . Hyperlipidemia   . Macular degeneration   . Memory loss of   . Osteoarthritis of spine   . Periumbilical hernia   . Psoriasis   . Chronic venous insufficiency   . AV block, complete   . Mobitz (type) II atrioventricular block   . Obesity, unspecified   . Left bundle branch hemiblock   . Pure hypercholesterolemia   . Cardiac pacemaker in situ 01/18/05    1st one inserted 01/18/05. 2nd one inserted 06/06/10.  Marland Kitchen LBBB (left bundle branch block)   . Lower extremity edema     Chronic   . Fluid retention in legs 01/15/2014  . Need for pneumococcal vaccination 01/15/2014  . Prostate cancer     Urologist watchfully waiting   Past  Surgical History  Procedure Laterality Date  . Tonsillectomy      Age 12  . Partial colectomy  07/09/02    For Polyps  . Pacemaker placement  01/18/05  . 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 Cataract extraction  6/12    left eye Dr Katy Fitch  . Pacemaker replacement  06/06/10    Dr Leonia Reeves   Prior to Admission medications   Medication Sig Start Date End Date Taking? Authorizing Provider  Coenzyme Q10 (CO Q 10) 100 MG CAPS Take 200 mg by mouth daily.    Yes Historical Provider, MD  Multiple Vitamin (MULTIVITAMIN) tablet Take 1 tablet by mouth daily.   Yes Historical Provider, MD  Vitamin D, Cholecalciferol, 1000 UNITS TABS Take 1,000 Units by mouth daily. 01/19/14  Yes Historical Provider, MD  abiraterone Acetate (ZYTIGA) 250 MG tablet Take 2 tablets (500 mg total) by mouth daily. Take on an empty stomach 1 hour before or 2 hours after a meal 03/24/14   Nobie Putnam, DO  calcium-vitamin D (OSCAL WITH D) 500-200 MG-UNIT per tablet Take 1 tablet by mouth daily with breakfast. 03/24/14   Nobie Putnam, DO  feeding supplement (BOOST HIGH PROTEIN) LIQD Take 1 Container by mouth every morning. 03/24/14   Nobie Putnam, DO  furosemide (LASIX) 20 MG tablet Take 1 tablet (20 mg total) by mouth daily. 03/24/14   Nobie Putnam, DO  guaiFENesin (MUCINEX) 600 MG 12 hr tablet Take 1 tablet (600 mg total) by mouth 2 (two) times daily. 03/24/14   Nobie Putnam, DO  predniSONE (DELTASONE) 5 MG tablet Take 1 tablet (5 mg total) by mouth 2 (two) times daily with a meal. 03/24/14   Nobie Putnam, DO  traMADol (ULTRAM) 50 MG tablet Take 1 tablet (50 mg total) by mouth every 6 (six) hours as needed for moderate pain. 03/24/14   Nobie Putnam, DO   No Known Allergies  FAMILY HISTORY:  Family History  Problem Relation Age of Onset  . Dementia Mother     Died at 37  . CAD Mother   . Cancer Father   . Heart murmur Father   . Cancer Paternal Grandmother    . Kidney failure Maternal Uncle   . Alzheimer's disease Daughter   . COPD Daughter   . Cancer Brother    SOCIAL HISTORY:  reports that he has quit smoking. His smoking use included Pipe. He has quit using smokeless tobacco. He reports that he drinks alcohol. He reports that he does not use illicit drugs.  REVIEW OF SYSTEMS:  Unable to obtain  SUBJECTIVE:   VITAL SIGNS: Temp:  [98.2 F (36.8 C)-98.6 F (37 C)] 98.6 F (37 C) (05/22 3220) Pulse Rate:  [84-103] 98 (05/22 0730) Resp:  [14-20] 14 (05/22 0730) BP: (93-140)/(44-73) 93/44 mmHg (05/22 0730) SpO2:  [92 %-100 %] 100 % (05/22 0730) FiO2 (%):  [100 %] 100 % (05/22 0715) Weight:  [208 lb 15.9 oz (94.8 kg)-213 lb 3 oz (96.7 kg)] 213 lb 3 oz (96.7 kg) (05/22 0800) HEMODYNAMICS:   VENTILATOR SETTINGS: Vent Mode:  [-] PRVC FiO2 (%):  [100 %] 100 % Set Rate:  [14 bmp] 14 bmp Vt Set:  [510 mL] 510 mL PEEP:  [5 cmH20] 5 cmH20 Plateau Pressure:  [21 cmH20] 21 cmH20 INTAKE / OUTPUT: Intake/Output     05/21 0701 - 05/22 0700 05/22 0701 - 05/23 0700   P.O. 340    I.V. (mL/kg) 3125 (33)    IV Piggyback 300    Total Intake(mL/kg) 3765 (39.7)    Net +3765          Urine Occurrence 5 x    Stool Occurrence       PHYSICAL EXAMINATION: Gen. Chronically ill, dishevelled ENT - no lesions, no post nasal drip, oral ETT, large beard Neck: No JVD, no thyromegaly, no carotid bruits Lungs: no use of accessory muscles, no dullness to percussion, decreased left without rales or rhonchi  Cardiovascular: Rhythm regular, heart sounds  normal, no murmurs, no peripheral edema Abdomen: soft and non-tender, no hepatosplenomegaly, BS normal. Musculoskeletal: No deformities, no cyanosis or clubbing Neuro:  Unresponsive, pupils 1mm nRTL, dolls eye + Skin:  Warm, no lesions/ rash   LABS:  CBC  Recent Labs Lab 03/24/14 1300 03/25/14 0500 03/26/14 0550  WBC 6.6 6.7 8.7  HGB 8.8* 8.9* 9.1*  HCT 26.5* 28.0* 29.3*  PLT 146* 190 265    Coag's No results found for this basename: APTT, INR,  in the last 168 hours BMET  Recent Labs Lab 03/23/14 0612 03/25/14 1100 03/25/14 1958  NA 144 149* 148*  K 3.2* 3.4* 3.7  CL 109 112 113*  CO2 22 22 23   BUN 10 12 13   CREATININE 0.69 0.65 0.67  GLUCOSE 127* 128* 141*  Electrolytes  Recent Labs Lab 03/23/14 0612 03/25/14 1100 03/25/14 1958  CALCIUM 8.3* 9.2 9.0   Sepsis Markers  Recent Labs Lab 03/16/2014 1200  LATICACIDVEN 1.57   ABG No results found for this basename: PHART, PCO2ART, PO2ART,  in the last 168 hours Liver Enzymes  Recent Labs Lab 03/29/2014 1135 03/22/14 0615 03/23/14 0612  AST 47* 46* 44*  ALT 17 15 18   ALKPHOS 191* 148* 156*  BILITOT 1.5* 0.8 1.0  ALBUMIN 3.0* 2.4* 2.6*   Cardiac Enzymes  Recent Labs Lab 04/02/2014 1135  TROPONINI <0.30   Glucose  Recent Labs Lab 03/24/14 2133 03/25/14 0723 03/25/14 1142 03/25/14 1717 03/26/14 0651 03/26/14 0712  GLUCAP 125* 122* 118* 126* 147* 150*    Imaging Dg Chest Port 1 View  03/25/2014   CLINICAL DATA:  New oxygen requirement.  Prostate cancer  EXAM: PORTABLE CHEST - 1 VIEW  COMPARISON:  03/05/2014  FINDINGS: Interval development of left lower lobe consolidation. This is suspicious for pneumonia versus atelectasis. Mild right lower lobe atelectasis. Pulmonary vascular congestion due to low lung volumes. Sclerotic metastatic disease is present in the skeleton.  IMPRESSION: Hypoventilation. Increase in left lower lobe atelectasis versus pneumonia   Electronically Signed   By: Franchot Gallo M.D.   On: 03/25/2014 13:42     CXR: ETT in position, LLL effusion/ ASD  ASSESSMENT / PLAN:  PULMONARY A:Acute resp failure post arrest Hypoxia priro to arrest, r/o PE P:   Check ABG Start IV heparin empiric - chk duplex BLEs & CT angio when able  CARDIOVASCULAR A: PEA arrest -?etio MI vs PE vs CVA AICD Shock P:  Echo  Ekg, cycle trops Neo gtt now, change to levo gtt after placing  CVL  RENAL A:  At risk AKI P:   Hydrate prior & after contrast  GASTROINTESTINAL A:  Dysphagia based on swallow eval 5/20 P:   dys 2 diet  HEMATOLOGIC A:  Anemia, chronic disease P:  Goal Hb 7 & above  INFECTIOUS A:  HCAP P:   Ct vanc/cefepime resp cx  ENDOCRINE A:  No issues, TSH nml   P:   SSI  NEUROLOGIC A:  At risk anoxic encephalopathy  P:   RASS goal: 0 fent prn Head CT   TODAY'S SUMMARY: Unclear cause of PEA arrest -?new MI vs PE Discussed with daughter Mardene Celeste & gave guarded prognosis - she does want to continue efforts in the short term until she is able to visit  I have personally obtained a history, examined the patient, evaluated laboratory and imaging results, formulated the assessment and plan and placed orders. CRITICAL CARE: The patient is critically ill with multiple organ systems failure and requires high complexity decision making for assessment and support, frequent evaluation and titration of therapies, application of advanced monitoring technologies and extensive interpretation of multiple databases. Critical Care Time devoted to patient care services described in this note is 60 minutes.   Rigoberto Noel  Pulmonary and [place] Pager: [phone]  03/26/2014, 8:17 AM

## 2014-03-26 NOTE — Progress Notes (Signed)
Around 5638 Patient noted by another RN breathless and pulseless. Skin warm and dry. Code initiated. Patient was able to get resuscitated back. Patient was transferred to 43M with the code team. Spoke with the daughter and updated on the patient condition. MD spoke with the daughter as well.

## 2014-03-26 NOTE — Progress Notes (Signed)
Family Practice Teaching Service Late Entry Interval Progress Note  Notified by unit 6E that code blue had been called, and pt actively being resuscitated. Went to bedside. Pt apparently suffered PEA arrest. Was successfully resuscitated after 8 mins CPR and epi IV x2. Pt intubated and being transferred to ICU on CCM service.  Called and spoke with daughter Peter [name] to update her on these events. Explained guarded prognosis. She confirmed that she wants everything done to help her father. She will come to the hospital today.  Appreciate excellent care of code blue team, and CCM's assistance in caring for Peter [name]. FPTS will continue to follow along socially while he is in the ICU.  Chrisandra Netters, MD Family Medicine PGY-2 Service Pager [phone]

## 2014-03-26 NOTE — Progress Notes (Signed)
  Patient Name: Peter [name]   MRN: [id number]   Date of Birth/ Sex: October 20, 1919 , male      Admission Date: 03/29/2014  Attending Provider: Alveda Reasons, MD  Primary Diagnosis: Dehydration   Indication: Pt was in his usual state of health until this AM, when he was noted to be in PEA. Code blue was subsequently called. At the time of arrival on scene, ACLS protocol was underway.   Technical Description:  - CPR performance duration:  8  minutes  - Was defibrillation or cardioversion used? No   - Was external pacer placed? No  - Was patient intubated pre/post CPR? Yes   Medications Administered: Y = Yes; Blank = No Amiodarone    Atropine    Calcium    Epinephrine  2  Lidocaine    Magnesium    Norepinephrine    Phenylephrine    Sodium bicarbonate    Vasopressin     Post CPR evaluation:  - Final Status - Was patient successfully resuscitated ? Yes - What is current rhythm? NSR - What is current hemodynamic status? NSR, 163/61  Miscellaneous Information:  - Labs sent, including: none  - Primary team notified?  Yes  - Family Notified? Yes  - Additional notes/ transfer status: Transfer to 63M     Corky Sox, MD  03/26/2014, 6:53 AM

## 2014-03-26 NOTE — Progress Notes (Signed)
FMTS Attending Note Patient's case discussed with resident team and I agree with Dr Parks Ranger' note for today.  Dalbert Mayotte, MD

## 2014-03-26 NOTE — Clinical Social Work Note (Signed)
Patieint transferred from [place] to 43M. Receiving CSW contacted and handoff provided.  Karmel Patricelli Givens, MSW, LCSW [phone]

## 2014-03-27 ENCOUNTER — Inpatient Hospital Stay (HOSPITAL_COMMUNITY): Payer: Medicare Other

## 2014-03-27 DIAGNOSIS — J9600 Acute respiratory failure, unspecified whether with hypoxia or hypercapnia: Secondary | ICD-10-CM

## 2014-03-27 DIAGNOSIS — N179 Acute kidney failure, unspecified: Secondary | ICD-10-CM

## 2014-03-27 LAB — CBC WITH DIFFERENTIAL/PLATELET
Basophils Absolute: 0 10*3/uL (ref 0.0–0.1)
Basophils Relative: 0 % (ref 0–1)
EOS PCT: 0 % (ref 0–5)
Eosinophils Absolute: 0 10*3/uL (ref 0.0–0.7)
HEMATOCRIT: 22.3 % — AB (ref 39.0–52.0)
HEMOGLOBIN: 7.3 g/dL — AB (ref 13.0–17.0)
LYMPHS ABS: 0.6 10*3/uL — AB (ref 0.7–4.0)
LYMPHS PCT: 11 % — AB (ref 12–46)
MCH: 31.6 pg (ref 26.0–34.0)
MCHC: 32.7 g/dL (ref 30.0–36.0)
MCV: 96.5 fL (ref 78.0–100.0)
Monocytes Absolute: 0.3 10*3/uL (ref 0.1–1.0)
Monocytes Relative: 6 % (ref 3–12)
NEUTROS PCT: 83 % — AB (ref 43–77)
Neutro Abs: 4.1 10*3/uL (ref 1.7–7.7)
PLATELETS: 132 10*3/uL — AB (ref 150–400)
RBC: 2.31 MIL/uL — ABNORMAL LOW (ref 4.22–5.81)
RDW: 14.9 % (ref 11.5–15.5)
WBC: 5 10*3/uL (ref 4.0–10.5)

## 2014-03-27 LAB — LEGIONELLA ANTIGEN, URINE: LEGIONELLA ANTIGEN, URINE: NEGATIVE

## 2014-03-27 LAB — URINE CULTURE
CULTURE: NO GROWTH
Colony Count: NO GROWTH
SPECIAL REQUESTS: NORMAL

## 2014-03-27 LAB — MAGNESIUM
MAGNESIUM: 1.8 mg/dL (ref 1.5–2.5)
Magnesium: 1.8 mg/dL (ref 1.5–2.5)

## 2014-03-27 LAB — GLUCOSE, CAPILLARY
GLUCOSE-CAPILLARY: 128 mg/dL — AB (ref 70–99)
GLUCOSE-CAPILLARY: 135 mg/dL — AB (ref 70–99)
GLUCOSE-CAPILLARY: 162 mg/dL — AB (ref 70–99)
Glucose-Capillary: 127 mg/dL — ABNORMAL HIGH (ref 70–99)
Glucose-Capillary: 126 mg/dL — ABNORMAL HIGH (ref 70–99)
Glucose-Capillary: 123 mg/dL — ABNORMAL HIGH (ref 70–99)

## 2014-03-27 LAB — PHOSPHORUS
PHOSPHORUS: 2.7 mg/dL (ref 2.3–4.6)
Phosphorus: 2.7 mg/dL (ref 2.3–4.6)

## 2014-03-27 LAB — BASIC METABOLIC PANEL
BUN: 22 mg/dL (ref 6–23)
CO2: 21 meq/L (ref 19–32)
Calcium: 8.5 mg/dL (ref 8.4–10.5)
Chloride: 117 mEq/L — ABNORMAL HIGH (ref 96–112)
Creatinine, Ser: 1.54 mg/dL — ABNORMAL HIGH (ref 0.50–1.35)
GFR calc Af Amer: 43 mL/min — ABNORMAL LOW (ref >90)
GFR calc non Af Amer: 37 mL/min — ABNORMAL LOW (ref >90)
GLUCOSE: 148 mg/dL — AB (ref 70–99)
POTASSIUM: 3.8 meq/L (ref 3.7–5.3)
SODIUM: 149 meq/L — AB (ref 137–147)

## 2014-03-27 LAB — TROPONIN I
Troponin I: 2.98 ng/mL (ref <0.30)
Troponin I: 2.84 ng/mL (ref <0.30)
Troponin I: 1.97 ng/mL (ref <0.30)

## 2014-03-27 LAB — HEPARIN LEVEL (UNFRACTIONATED): Heparin Unfractionated: <0.1 IU/mL — ABNORMAL LOW (ref 0.30–0.70)

## 2014-03-27 MED ORDER — VITAL HIGH PROTEIN PO LIQD
1000.0000 mL | ORAL | Status: DC
  Filled 2014-03-27 (×2): qty 1000

## 2014-03-27 MED ORDER — PANTOPRAZOLE SODIUM 40 MG PO PACK
40.0000 mg | PACK | Freq: Every day | ORAL | Status: DC
  Administered 2014-03-27 – 2014-04-11 (×17): 40 mg
  Filled 2014-03-27 (×16): qty 20

## 2014-03-27 MED ORDER — SODIUM CHLORIDE 0.9 % IV SOLN: INTRAVENOUS | Status: AC

## 2014-03-27 MED ORDER — VITAL HIGH PROTEIN PO LIQD
1000.0000 mL | ORAL | Status: DC
  Administered 2014-03-27 – 2014-04-06 (×13): 1000 mL
  Administered 2014-04-06: 14:00:00
  Administered 2014-04-07 – 2014-04-11 (×7): 1000 mL
  Administered 2014-04-11: 15:00:00
  Filled 2014-03-27 (×20): qty 1000

## 2014-03-27 NOTE — Progress Notes (Signed)
NUTRITION CONSULT/FOLLOW UP  Intervention:    Utilize 86M PEPuP Protocol: initiate TF via OGT with Vital High Protein at 25 ml/h on day 1; on day 2, increase to goal rate of 60 ml/h (1440 ml per day) to provide 1440 kcals (23 kcals/kg ideal weight), 126 gm protein, 1204 ml free water daily RD to follow for nutrition care plan  New Nutrition Dx:  Inadequate oral intake related to inability to eat as evidenced by NPO status, ongoing  New Goal:   Enteral nutrition to provide 60-70% of estimated calorie needs (22-25 kcals/kg ideal body weight) and 100% of estimated protein needs, based on ASPEN guidelines for permissive underfeeding in critically ill obese individuals, currently unmet  Monitor:   TF tolerance/adequacy, weight trend, labs, vent status.  Assessment:   PMHx significant for squamous cell CA, osteoporosis, HLD. Admitted s/p fall down x 4 days. Work-up ongoing.  Patient previously on dysphagia 2 diet with nectar thick liquids with very poor intake of meals. Coded this AM and transferred to the MICU.   Patient is currently intubated on ventilator support -- OGT in place MV: 9.4 L/min Temp (24hrs), Avg:99.1 F (37.3 C), Min:98.6 F (37 C), Max:100 F (37.8 C)   RD consulted for TF initiation & management.  Height: Ht Readings from Last 1 Encounters:  03/26/14 5\' 5"  (1.651 m)    Weight Status:   Wt Readings from Last 1 Encounters:  03/26/14 213 lb 3 oz (96.7 kg)  03/23/14  203 lb 9.6 oz (92.352 kg)  Mar 29, 2014 195 lb 5.2 oz (88.6 kg)  BMI=32.5 (using admission weight)  Re-estimated needs:  Kcal: 1882 Protein: 124 gm Fluid: 1.7-2 L2  Skin: no wounds  Diet Order: NPO   Intake/Output Summary (Last 24 hours) at 03/27/14 1047 Last data filed at 03/27/14 0600  Gross per 24 hour  Intake 2777.05 ml  Output    200 ml  Net 2577.05 ml    Last BM: 5/20  Labs:   Recent Labs Lab 03/25/14 1958 03/26/14 0719 03/27/14 0305  NA 148* 148* 149*  K 3.7 4.3 3.8   CL 113* 114* 117*  CO2 23 20 21   BUN 13 17 22   CREATININE 0.67 1.02 1.54*  CALCIUM 9.0 8.4 8.5  MG  --  1.8  --   GLUCOSE 141* 143* 148*    CBG (last 3)   Recent Labs  03/27/14 0021 03/27/14 0403 03/27/14 0717  GLUCAP 162* 135* 127*    Scheduled Meds: . antiseptic oral rinse  15 mL Mouth Rinse QID  . calcium-vitamin D  1 tablet Oral Q breakfast  . ceFEPime (MAXIPIME) IV  2 g Intravenous Q24H  . chlorhexidine  15 mL Mouth Rinse BID  . feeding supplement (VITAL HIGH PROTEIN)  1,000 mL Per Tube Q24H  . heparin subcutaneous  5,000 Units Subcutaneous 3 times per day  . hydrocortisone sod succinate (SOLU-CORTEF) inj  50 mg Intravenous Q6H  . insulin aspart  0-9 Units Subcutaneous 6 times per day  . pantoprazole sodium  40 mg Per Tube Q1200  . sodium chloride  3 mL Intravenous Q12H  . vancomycin  1,250 mg Intravenous Q24H    Continuous Infusions: . sodium chloride    . norepinephrine (LEVOPHED) Adult infusion 2 mcg/min (03/27/14 0600)  . phenylephrine (NEO-SYNEPHRINE) Adult infusion Stopped (03/26/14 1047)    Arthur Holms, RD, LDN Pager #: [phone] After-Hours Pager #: [phone]

## 2014-03-27 NOTE — Progress Notes (Signed)
Came to bedside d/t vent alarming, noted pt w/ increased WOB, increased RR.  Attempted to increase PS, but pt continued to w/ increased WOB and increased RR.  Placed pt back on full vent rest mode.

## 2014-03-27 NOTE — Progress Notes (Addendum)
PULMONARY / CRITICAL CARE MEDICINE   Name: Peter [name] MRN: [id number] DOB: 06-Nov-1918    ADMISSION DATE:  Apr 11, 2014 CONSULTATION DATE:  03/27/2014  REFERRING MD :  Gevena Cotton PRIMARY SERVICE: IMTS  CHIEF COMPLAINT:  PEA arrest  BRIEF PATIENT DESCRIPTION: 80 y.o with metastatic prostate cancer adm 5/17 with left HCAP/effusion, was on 4L [place] , transferred to ICU after PEA arrest on 5/22, 8 min downtime, epi x 2 PMH - metastatic prostate CA, CAD with ventricular pacer, HTN, HLD, chronic anemia    SIGNIFICANT EVENTS / STUDIES:  5/22 PEA arrest 5/22 duplex neg,           CT angio neg PE- mod bL effusions,         head CT neg  LINES / TUBES: ETT 5/22 >> Lt [place] 5/22 >>  CULTURES: resp 5/22 >>  ANTIBIOTICS: 5/21 cefepime >> 5/21 vanc >>  SUBJECTIVE: afebrile Remains unresponsive Poor UO  VITAL SIGNS: Temp:  [98.6 F (37 C)-100 F (37.8 C)] 98.9 F (37.2 C) (05/23 0800) Pulse Rate:  [70-97] 79 (05/23 0836) Resp:  [13-22] 22 (05/23 0836) BP: (89-173)/(35-71) 104/47 mmHg (05/23 0836) SpO2:  [100 %] 100 % (05/23 0836) Arterial Line BP: (95-170)/(36-67) 168/67 mmHg (05/23 0600) FiO2 (%):  [40 %-80 %] 40 % (05/23 0914) HEMODYNAMICS: CVP:  [8 mmHg-16 mmHg] 8 mmHg VENTILATOR SETTINGS: Vent Mode:  [-] PRVC FiO2 (%):  [40 %-80 %] 40 % Set Rate:  [14 bmp] 14 bmp Vt Set:  [510 mL] 510 mL PEEP:  [5 cmH20] 5 cmH20 Pressure Support:  [5 cmH20] 5 cmH20 Plateau Pressure:  [17 cmH20-22 cmH20] 17 cmH20 INTAKE / OUTPUT: Intake/Output     05/22 0701 - 05/23 0700 05/23 0701 - 05/24 0700   P.O.     I.V. (mL/kg) 3039.1 (31.4)    IV Piggyback 750    Total Intake(mL/kg) 3789.1 (39.2)    Emesis/NG output 200    Total Output 200     Net +3589.1            PHYSICAL EXAMINATION: Gen. Chronically ill, dishevelled ENT - no lesions, no post nasal drip, oral ETT, large beard Neck: No JVD, no thyromegaly, no carotid bruits Lungs: no use of accessory muscles, no dullness to  percussion, decreased left without rales or rhonchi  Cardiovascular: Rhythm regular, heart sounds  normal, no murmurs, 2+ peripheral edema Abdomen: soft and non-tender, no hepatosplenomegaly, BS normal. Musculoskeletal: No deformities, no cyanosis or clubbing Neuro:  Unresponsive, pupils 49mm nRTL, dolls eye + Skin:  Warm, no lesions/ rash   LABS:  CBC  Recent Labs Lab 03/25/14 0500 03/26/14 0550 03/27/14 0305  WBC 6.7 8.7 5.0  HGB 8.9* 9.1* 7.3*  HCT 28.0* 29.3* 22.3*  PLT 190 265 132*   Coag's No results found for this basename: APTT, INR,  in the last 168 hours BMET  Recent Labs Lab 03/25/14 1958 03/26/14 0719 03/27/14 0305  NA 148* 148* 149*  K 3.7 4.3 3.8  CL 113* 114* 117*  CO2 23 20 21   BUN 13 17 22   CREATININE 0.67 1.02 1.54*  GLUCOSE 141* 143* 148*   Electrolytes  Recent Labs Lab 03/25/14 1958 03/26/14 0719 03/27/14 0305  CALCIUM 9.0 8.4 8.5  MG  --  1.8  --    Sepsis Markers  Recent Labs Lab 11-Apr-2014 1200 03/26/14 0830  LATICACIDVEN 1.57 4.6*   ABG  Recent Labs Lab 03/26/14 1021  PHART 7.322*  PCO2ART 40.9  PO2ART 183.0*  Liver Enzymes  Recent Labs Lab 03/22/14 0615 03/23/14 0612 03/26/14 0719  AST 46* 44* 55*  ALT 15 18 24   ALKPHOS 148* 156* 146*  BILITOT 0.8 1.0 0.6  ALBUMIN 2.4* 2.6* 2.4*   Cardiac Enzymes  Recent Labs Lab 03/26/14 1712 03/26/14 2313 03/27/14 0251  TROPONINI 3.95* 2.98* 2.84*   Glucose  Recent Labs Lab 03/26/14 1200 03/26/14 1551 03/26/14 2000 03/27/14 0021 03/27/14 0403 03/27/14 0717  GLUCAP 139* 148* 157* 162* 135* 127*    Imaging Ct Head Wo Contrast  03/26/2014   CLINICAL DATA:  Cardiac arrest last night.  Hypoxia.  EXAM: CT HEAD WITHOUT CONTRAST  TECHNIQUE: Contiguous axial images were obtained from the base of the skull through the vertex without intravenous contrast.  COMPARISON:  Head CT 03/25/14 and 03/24/2013.  FINDINGS: There is stable generalized atrophy with patchy  periventricular and subcortical white matter disease. No acute intracranial hemorrhage, mass lesion, brain edema or extra-axial fluid collection is seen. There is no hydrocephalus or evidence of acute stroke.  Mucosal thickening is present within the maxillary sinuses bilaterally. There are no air-fluid levels. Periodontal disease is noted involving the central right maxillary incisor. No acute osseous findings are evident.  IMPRESSION: Stable head CT demonstrating atrophy and chronic small vessel ischemic changes. No evidence of acute stroke.   Electronically Signed   By: Camie Patience M.D.   On: 03/26/2014 16:55   Ct Angio Chest Pe W/cm &/or Wo Cm  03/26/2014   CLINICAL DATA:  Cardiac arrest.  Hypoxia.  Query pulmonary embolus.  EXAM: CT ANGIOGRAPHY CHEST WITH CONTRAST  TECHNIQUE: Multidetector CT imaging of the chest was performed using the standard protocol during bolus administration of intravenous contrast. Multiplanar CT image reconstructions and MIPs were obtained to evaluate the vascular anatomy.  CONTRAST:  133mL OMNIPAQUE IOHEXOL 350 MG/ML SOLN  COMPARISON:  DG CHEST 1V PORT dated 03/26/2014  FINDINGS: No filling defect is identified in the pulmonary arterial tree to suggest pulmonary embolus. Although not optimally opacified, no acute aortic abnormalities are identified. There is some mild stranding adjacent to the subclavian vein near a site of catheter access, likely incidental.  Large bilateral pleural effusions with passive atelectasis. Effusions could be exudative or transudative. Prominent cardiomegaly. Calcified aortic valve.  Nodular contour liver compatible with hepatic cirrhosis.  There is diffuse sclerotic metastatic metastatic disease of the bony structures of the thorax. No vertebral collapse or subluxation. I do not observe an acute sternal fracture. There are acute fractures of the right anterior fourth and fifth ribs. 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 Nondisplaced  fracture of the left fourth rib anteriorly and of the left fifth rib anteriorly.  There is passive atelectasis associated with the bilateral pleural effusions. Approximately 50% of the lungs is aerated.  Review of the MIP images confirms the above findings.  IMPRESSION: 1. No embolus or acute aortic abnormality. 2. Acute fractures of the fourth and fifth ribs anteriorly, bilaterally. 3. Diffuse osseous metastatic disease compatible with metastatic prostate cancer. 4. Large bilateral pleural effusions with passive atelectasis. 5. Prominent cardiomegaly.  Calcified aortic valve. 6. Hepatic cirrhosis.   Electronically Signed   By: Sherryl Barters M.D.   On: 03/26/2014 16:47   Dg Chest Port 1 View  03/26/2014   CLINICAL DATA:  Status post central line L and orogastric tube placement  EXAM: PORTABLE CHEST - 1 VIEW  COMPARISON:  DG CHEST 1V PORT dated 03/26/2014  FINDINGS: The PICC line placed via the right  upper extremity has its tip in the region of the proximal SVC. The orogastric tube tip in proximal port projects off the film. The endotracheal tube tip lies 2.8 cm above the crotch of the carinal. The permanent pacemaker is unchanged in appearance. External pacing pads are present.  The cardiac silhouette is top-normal in size. The left hemidiaphragm is partially obscured. The interstitial markings are minimally prominent though stable. No significant pleural effusion is demonstrated. There are degenerative changes of the shoulders.  IMPRESSION: No significant interval change in the appearance of the chest today. The support tubes and lines are in appropriate position. No post procedure complication demonstrated.   Electronically Signed   By: David  Martinique   On: 03/26/2014 09:47   Dg Chest Port 1 View  03/26/2014   CLINICAL DATA:  PEA arrest.  Intubation.  EXAM: PORTABLE CHEST - 1 VIEW  COMPARISON:  03/25/2014  FINDINGS: An endotracheal tube has been placed with tip approximately 2-3 cm above the carina. The  cardiac silhouette remains enlarged. Left-sided dual lead pacemaker remains in place. Lung volumes are mildly improved following intubation. Pulmonary vascular congestion persists. Left lung is partially obscured by an external pad. There is likely mildly improved aeration of the left lower lobe. Patchy opacity persists in the right lung base. No definite pleural effusion or pneumothorax is identified.  IMPRESSION: 1. Interval endotracheal tube placement as above. 2. Mildly improved lung volumes following intubation with persistent pulmonary vascular congestion. 3. Persistent bibasilar lung opacities, mildly improved in the left base, which may reflect atelectasis.   Electronically Signed   By: Logan Bores   On: 03/26/2014 08:37   Dg Chest Port 1 View  03/25/2014   CLINICAL DATA:  New oxygen requirement.  Prostate cancer  EXAM: PORTABLE CHEST - 1 VIEW  COMPARISON:  03/22/2014  FINDINGS: Interval development of left lower lobe consolidation. This is suspicious for pneumonia versus atelectasis. Mild right lower lobe atelectasis. Pulmonary vascular congestion due to low lung volumes. Sclerotic metastatic disease is present in the skeleton.  IMPRESSION: Hypoventilation. Increase in left lower lobe atelectasis versus pneumonia   Electronically Signed   By: Franchot Gallo M.D.   On: 03/25/2014 13:42   Dg Abd Portable 1v  03/26/2014   CLINICAL DATA:  Enteric tube placement.  EXAM: PORTABLE ABDOMEN - 1 VIEW  COMPARISON:  None.  FINDINGS: Enteric tube noted with tip projected over stomach. Oral contrast of the colon. Diffuse sclerotic lesions are noted throughout the lumbar spine and pelvis consistent blastic metastatic disease .  IMPRESSION: 1.  Enteric tube noted with tip projected over the stomach.  2.  Diffuse blastic metastatic disease   Electronically Signed   By: Marcello Moores  Register   On: 03/26/2014 09:50     CXR: ETT in position, BL effusions vs ASD  ASSESSMENT / PLAN:  PULMONARY A:Acute resp failure  post arrest Hypoxia prior to arrest, CT angio neg PE P:   dc IV heparin   CARDIOVASCULAR A: PEA arrest -?etio ACS, peak tp 3.9 AICD Shock Echo - nml LVEF, no WMA< RV mild dilated P:   levo gtt -aim for SBP 100 or above  RENAL A:  AKI P:   Hydrate per CVP  GASTROINTESTINAL A:  Dysphagia based on swallow eval 5/20 P:   Start TFs  HEMATOLOGIC A:  Anemia, chronic disease P:  Goal Hb 7 & above  INFECTIOUS A:  HCAP P:   Ct vanc/cefepime resp cx  ENDOCRINE A:  No issues, TSH nml  P:   SSI Stress dose steroids , since he was on prednisone  NEUROLOGIC A:  At risk anoxic encephalopathy , Head CT  neg P:   RASS goal: 0 fent prn   TODAY'S SUMMARY: Unclear cause of PEA arrest -?new MI Discussed with daughter Mardene Celeste & gave guarded prognosis - she does want to continue efforts in the short term Neuro prognosticate in 24h    I have personally obtained a history, examined the patient, evaluated laboratory and imaging results, formulated the assessment and plan and placed orders. CRITICAL CARE: The patient is critically ill with multiple organ systems failure and requires high complexity decision making for assessment and support, frequent evaluation and titration of therapies, application of advanced monitoring technologies and extensive interpretation of multiple databases. Critical Care Time devoted to patient care services described in this note is 40 minutes.   Rigoberto Noel  Pulmonary and [place] Pager: [phone]  03/27/2014, 10:01 AM

## 2014-03-28 ENCOUNTER — Inpatient Hospital Stay (HOSPITAL_COMMUNITY): Payer: Medicare Other

## 2014-03-28 LAB — GLUCOSE, CAPILLARY
GLUCOSE-CAPILLARY: 178 mg/dL — AB (ref 70–99)
GLUCOSE-CAPILLARY: 183 mg/dL — AB (ref 70–99)
GLUCOSE-CAPILLARY: 206 mg/dL — AB (ref 70–99)
Glucose-Capillary: 152 mg/dL — ABNORMAL HIGH (ref 70–99)
Glucose-Capillary: 153 mg/dL — ABNORMAL HIGH (ref 70–99)
Glucose-Capillary: 165 mg/dL — ABNORMAL HIGH (ref 70–99)
Glucose-Capillary: 195 mg/dL — ABNORMAL HIGH (ref 70–99)

## 2014-03-28 LAB — BASIC METABOLIC PANEL
BUN: 35 mg/dL — AB (ref 6–23)
CALCIUM: 8.5 mg/dL (ref 8.4–10.5)
CO2: 20 mEq/L (ref 19–32)
CREATININE: 1.91 mg/dL — AB (ref 0.50–1.35)
Chloride: 115 mEq/L — ABNORMAL HIGH (ref 96–112)
GFR calc Af Amer: 33 mL/min — ABNORMAL LOW (ref >90)
GFR, EST NON AFRICAN AMERICAN: 29 mL/min — AB (ref >90)
GLUCOSE: 161 mg/dL — AB (ref 70–99)
Potassium: 3.3 mEq/L — ABNORMAL LOW (ref 3.7–5.3)
Sodium: 146 mEq/L (ref 137–147)

## 2014-03-28 LAB — MAGNESIUM
Magnesium: 1.8 mg/dL (ref 1.5–2.5)
Magnesium: 1.8 mg/dL (ref 1.5–2.5)

## 2014-03-28 LAB — CBC
HCT: 20.7 % — ABNORMAL LOW (ref 39.0–52.0)
Hemoglobin: 7 g/dL — ABNORMAL LOW (ref 13.0–17.0)
MCH: 32.1 pg (ref 26.0–34.0)
MCHC: 33.8 g/dL (ref 30.0–36.0)
MCV: 95 fL (ref 78.0–100.0)
PLATELETS: 105 10*3/uL — AB (ref 150–400)
RBC: 2.18 MIL/uL — ABNORMAL LOW (ref 4.22–5.81)
RDW: 15 % (ref 11.5–15.5)
WBC: 4.1 10*3/uL (ref 4.0–10.5)

## 2014-03-28 LAB — PHOSPHORUS
Phosphorus: 3.3 mg/dL (ref 2.3–4.6)
Phosphorus: 2.8 mg/dL (ref 2.3–4.6)

## 2014-03-28 MED ORDER — FUROSEMIDE 10 MG/ML IJ SOLN
80.0000 mg | Freq: Once | INTRAMUSCULAR | Status: AC
  Administered 2014-03-28: 80 mg via INTRAVENOUS
  Filled 2014-03-28 (×2): qty 8

## 2014-03-28 MED ORDER — HYDROCORTISONE NA SUCCINATE PF 100 MG IJ SOLR
50.0000 mg | Freq: Three times a day (TID) | INTRAMUSCULAR | Status: DC
  Administered 2014-03-28 – 2014-03-30 (×6): 50 mg via INTRAVENOUS
  Filled 2014-03-28 (×9): qty 1

## 2014-03-28 NOTE — Progress Notes (Signed)
Pt w/ increased WOB, increased RR.  Increased secretions noted. Suctioned for mod amt secretions.  Attempted to increase PS, pt did not tol. Placed pt back on full vent support, RN aware.

## 2014-03-28 NOTE — Progress Notes (Addendum)
PULMONARY / CRITICAL CARE MEDICINE   Name: Peter [name] MRN: [id number] DOB: November 28, 1918    ADMISSION DATE:  03/27/2014 CONSULTATION DATE:  03/28/2014  REFERRING MD :  Gevena Cotton PRIMARY SERVICE: IMTS  CHIEF COMPLAINT:  PEA arrest  BRIEF PATIENT DESCRIPTION: 80 y.o with metastatic prostate cancer adm 5/17 with left HCAP/effusion, was on 4L [place] , transferred to ICU after PEA arrest on 5/22, 8 min downtime, epi x 2 PMH - metastatic prostate CA, CAD with ventricular pacer, HTN, HLD, chronic anemia    SIGNIFICANT EVENTS / STUDIES:  5/22 PEA arrest 5/22 duplex neg,           CT angio neg PE- mod bL effusions,         head CT neg  LINES / TUBES: ETT 5/22 >> Lt [place] 5/22 >>  CULTURES: resp 5/22 >>  ANTIBIOTICS: 5/21 cefepime >> 5/21 vanc >>  SUBJECTIVE:  Remains unresponsive Poor UO afebrile  VITAL SIGNS: Temp:  [98.5 F (36.9 C)-99.2 F (37.3 C)] 98.6 F (37 C) (05/24 0800) Pulse Rate:  [41-98] 92 (05/24 0821) Resp:  [14-26] 26 (05/24 0821) BP: (97-141)/(30-64) 117/61 mmHg (05/24 0821) SpO2:  [100 %] 100 % (05/24 0821) Arterial Line BP: (105-169)/(30-66) 169/66 mmHg (05/24 0600) FiO2 (%):  [40 %] 40 % (05/24 0821) Weight:  [101.6 kg (223 lb 15.8 oz)] 101.6 kg (223 lb 15.8 oz) (05/24 0500) HEMODYNAMICS:   VENTILATOR SETTINGS: Vent Mode:  [-] PSV;CPAP FiO2 (%):  [40 %] 40 % Set Rate:  [14 bmp] 14 bmp Vt Set:  [510 mL] 510 mL PEEP:  [5 cmH20] 5 cmH20 Pressure Support:  [5 cmH20-10 cmH20] 10 cmH20 Plateau Pressure:  [17 cmH20-19 cmH20] 17 cmH20 INTAKE / OUTPUT: Intake/Output     05/23 0701 - 05/24 0700 05/24 0701 - 05/25 0700   I.V. (mL/kg) 1001.8 (9.9)    NG/GT 310    IV Piggyback 332    Total Intake(mL/kg) 1643.8 (16.2)    Urine (mL/kg/hr) 150 (0.1)    Emesis/NG output 135 (0.1)    Total Output 285     Net +1358.8            PHYSICAL EXAMINATION: Gen. Chronically ill, dishevelled ENT - no lesions, no post nasal drip, oral ETT, large beard Neck:  No JVD, no thyromegaly, no carotid bruits Lungs: no use of accessory muscles, no dullness to percussion, decreased left without rales or rhonchi  Cardiovascular: Rhythm regular, heart sounds  normal, no murmurs, 2+ peripheral edema Abdomen: soft and non-tender, no hepatosplenomegaly, BS normal. Musculoskeletal: No deformities, no cyanosis or clubbing Neuro:  Unresponsive, pupils 86mm nRTL, dolls eye + Skin:  Warm, no lesions/ rash   LABS:  CBC  Recent Labs Lab 03/26/14 0550 03/27/14 0305 03/28/14 0400  WBC 8.7 5.0 4.1  HGB 9.1* 7.3* 7.0*  HCT 29.3* 22.3* 20.7*  PLT 265 132* 105*   Coag's No results found for this basename: APTT, INR,  in the last 168 hours BMET  Recent Labs Lab 03/26/14 0719 03/27/14 0305 03/28/14 0400  NA 148* 149* 146  K 4.3 3.8 3.3*  CL 114* 117* 115*  CO2 20 21 20   BUN 17 22 35*  CREATININE 1.02 1.54* 1.91*  GLUCOSE 143* 148* 161*   Electrolytes  Recent Labs Lab 03/26/14 0719 03/27/14 0305 03/27/14 1054 03/27/14 2250 03/28/14 0400  CALCIUM 8.4 8.5  --   --  8.5  MG 1.8  --  1.8 1.8  --   PHOS  --   --  2.7 2.7  --    Sepsis Markers  Recent Labs Lab 03/23/2014 1200 03/26/14 0830  LATICACIDVEN 1.57 4.6*   ABG  Recent Labs Lab 03/26/14 1021  PHART 7.322*  PCO2ART 40.9  PO2ART 183.0*   Liver Enzymes  Recent Labs Lab 03/22/14 0615 03/23/14 0612 03/26/14 0719  AST 46* 44* 55*  ALT 15 18 24   ALKPHOS 148* 156* 146*  BILITOT 0.8 1.0 0.6  ALBUMIN 2.4* 2.6* 2.4*   Cardiac Enzymes  Recent Labs Lab 03/26/14 2313 03/27/14 0251 03/27/14 0851  TROPONINI 2.98* 2.84* 1.97*   Glucose  Recent Labs Lab 03/27/14 1536 03/27/14 1957 03/28/14 0016 03/28/14 0416 03/28/14 0717 03/28/14 1116  GLUCAP 123* 128* 152* 153* 165* 178*    Imaging Ct Head Wo Contrast  03/26/2014   CLINICAL DATA:  Cardiac arrest last night.  Hypoxia.  EXAM: CT HEAD WITHOUT CONTRAST  TECHNIQUE: Contiguous axial images were obtained from the base  of the skull through the vertex without intravenous contrast.  COMPARISON:  Head CT 03/07/2014 and 03/24/2013.  FINDINGS: There is stable generalized atrophy with patchy periventricular and subcortical white matter disease. No acute intracranial hemorrhage, mass lesion, brain edema or extra-axial fluid collection is seen. There is no hydrocephalus or evidence of acute stroke.  Mucosal thickening is present within the maxillary sinuses bilaterally. There are no air-fluid levels. Periodontal disease is noted involving the central right maxillary incisor. No acute osseous findings are evident.  IMPRESSION: Stable head CT demonstrating atrophy and chronic small vessel ischemic changes. No evidence of acute stroke.   Electronically Signed   By: Camie Patience M.D.   On: 03/26/2014 16:55   Ct Angio Chest Pe W/cm &/or Wo Cm  03/26/2014   CLINICAL DATA:  Cardiac arrest.  Hypoxia.  Query pulmonary embolus.  EXAM: CT ANGIOGRAPHY CHEST WITH CONTRAST  TECHNIQUE: Multidetector CT imaging of the chest was performed using the standard protocol during bolus administration of intravenous contrast. Multiplanar CT image reconstructions and MIPs were obtained to evaluate the vascular anatomy.  CONTRAST:  112mL OMNIPAQUE IOHEXOL 350 MG/ML SOLN  COMPARISON:  DG CHEST 1V PORT dated 03/26/2014  FINDINGS: No filling defect is identified in the pulmonary arterial tree to suggest pulmonary embolus. Although not optimally opacified, no acute aortic abnormalities are identified. There is some mild stranding adjacent to the subclavian vein near a site of catheter access, likely incidental.  Large bilateral pleural effusions with passive atelectasis. Effusions could be exudative or transudative. Prominent cardiomegaly. Calcified aortic valve.  Nodular contour liver compatible with hepatic cirrhosis.  There is diffuse sclerotic metastatic metastatic disease of the bony structures of the thorax. No vertebral collapse or subluxation. I do not  observe an acute sternal fracture. There are acute fractures of the right anterior fourth and fifth ribs. 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 Nondisplaced fracture of the left fourth rib anteriorly and of the left fifth rib anteriorly.  There is passive atelectasis associated with the bilateral pleural effusions. Approximately 50% of the lungs is aerated.  Review of the MIP images confirms the above findings.  IMPRESSION: 1. No embolus or acute aortic abnormality. 2. Acute fractures of the fourth and fifth ribs anteriorly, bilaterally. 3. Diffuse osseous metastatic disease compatible with metastatic prostate cancer. 4. Large bilateral pleural effusions with passive atelectasis. 5. Prominent cardiomegaly.  Calcified aortic valve. 6. Hepatic cirrhosis.   Electronically Signed   By: Sherryl Barters M.D.   On: 03/26/2014 16:47   Dg Chest [facility]  03/27/2014   CLINICAL DATA:  Evaluate endotracheal tube  EXAM: PORTABLE CHEST - 1 VIEW  COMPARISON:  Chest x-ray and CT scan of the chest performed yesterday  FINDINGS: The carinal cannot be confidently identified. The tip of the endotracheal tube is in unchanged position regarding adjacent anatomic structures compared to yesterday and is likely 1-2 cm above the carina. The tip of the nasogastric tube lies off the field of view presumably within the stomach. A left subclavian approach central venous catheter remains unchanged. The tip is in the distal left innominate vein at its junction with the SVC. Stable left subclavian approach cardiac rhythm maintenance device. External defibrillator pads project over the chest.  Lower inspiratory volumes with increasing bibasilar atelectasis. Slightly increased pulmonary vascular congestion. No overt edema. No acute osseous abnormality.  IMPRESSION: 1. Essentially unchanged position of the endotracheal tube. The carina is difficult to identify precisely. The endotracheal tube is likely 1-2 cm above the carina.  2. Other support apparatus in unchanged position. 3. Lower inspiratory volumes with increased bibasilar atelectasis. 4. Slightly increased pulmonary vascular congestion now bordering on mild edema.   Electronically Signed   By: Jacqulynn Cadet M.D.   On: 03/27/2014 10:22     CXR: ETT in position, BL effusions vs ASD  ASSESSMENT / PLAN:  PULMONARY A:Acute resp failure post arrest Hypoxia prior to arrest, CT angio/ duplex  neg PE P:    SBTs  CARDIOVASCULAR A: PEA arrest -?etio ACS, peak tp 3.9 AICD Shock Echo - nml LVEF, no WMA< RV mild dilated P:  Off  levo gtt -aim for SBP 100 or above  RENAL A:  AKI P:   Hydrate per CVP Lasix 80 x 1  GASTROINTESTINAL A:  Dysphagia based on swallow eval 5/20 P:   ctTFs  HEMATOLOGIC A:  Anemia, chronic disease P:  Goal Hb 7 & above -may need transfusion soon  INFECTIOUS A:  HCAP P:   Ct cefepime, dc vanc  resp cx  ENDOCRINE A:  No issues, TSH nml   P:   SSI Stress dose steroids , since he was on prednisone  NEUROLOGIC A: Concern for anoxic encephalopathy , Head CT  neg P:   RASS goal: 0 fent prn Neuro input for prognostication if no improvement in 24h    TODAY'S SUMMARY: Unclear cause of PEA arrest -?new MI Discussed with daughter Mardene Celeste & gave guarded prognosis - she does want to continue efforts in the short term Palliative care input for goals of care given poor prognosis even if his neuro status improves   I have personally obtained a history, examined the patient, evaluated laboratory and imaging results, formulated the assessment and plan and placed orders. CRITICAL CARE: The patient is critically ill with multiple organ systems failure and requires high complexity decision making for assessment and support, frequent evaluation and titration of therapies, application of advanced monitoring technologies and extensive interpretation of multiple databases. Critical Care Time devoted to patient care services  described in this note is 35 minutes.   Rigoberto Noel  Pulmonary and [place] Pager: [phone]  03/28/2014, 11:58 AM

## 2014-03-28 NOTE — Progress Notes (Signed)
Increased PS to 10 d/t pt w/ increased WOB and agitation.

## 2014-03-28 NOTE — Progress Notes (Signed)
ANTIBIOTIC CONSULT NOTE - INITIAL  Pharmacy Consult for cefepime Indication: HAP  No Known Allergies  Patient Measurements: Height: 5\' 5"  (165.1 cm) Weight: 223 lb 15.8 oz (101.6 kg) IBW/kg (Calculated) : 61.5  Vital Signs: Temp: 98.3 F (36.8 C) (05/24 1200) Temp src: Oral (05/24 1200) BP: 117/61 mmHg (05/24 0821) Pulse Rate: 92 (05/24 0821) Intake/Output from previous day: 05/23 0701 - 05/24 0700 In: 1643.8 [I.V.:1001.8; NG/GT:310; IV Piggyback:332] Out: 285 [Urine:150; Emesis/NG output:135] Intake/Output from this shift:    Labs:  Recent Labs  03/26/14 0550 03/26/14 0719 03/27/14 0305 03/28/14 0400  WBC 8.7  --  5.0 4.1  HGB 9.1*  --  7.3* 7.0*  PLT 265  --  132* 105*  CREATININE  --  1.02 1.54* 1.91*   Medical History: Past Medical History  Diagnosis Date  . Cardiac septal defect     closed self during childhood  . Blood transfusion     colon polypectomy, bleeding requiring transfusion  . Hx of colonic polyps 03/03/2005    adenomatous  . Obstructive sleep apnea     Does not use CPAP  . Anemia   . Diabetes mellitus   . Diverticulosis of colon   . GERD without esophagitis   . Hyperlipidemia   . Macular degeneration   . Memory loss of   . Osteoarthritis of spine   . Periumbilical hernia   . Psoriasis   . Chronic venous insufficiency   . AV block, complete   . Mobitz (type) II atrioventricular block   . Obesity, unspecified   . Left bundle branch hemiblock   . Pure hypercholesterolemia   . Cardiac pacemaker in situ 01/18/05    1st one inserted 01/18/05. 2nd one inserted 06/06/10.  Marland Kitchen LBBB (left bundle branch block)   . Lower extremity edema     Chronic   . Fluid retention in legs 01/15/2014  . Need for pneumococcal vaccination 01/15/2014  . Prostate cancer     Urologist watchfully waiting    Assessment: 40 YOM admitted 5/17, CXR showed new left basilar infiltrate. WBC normal and patient afebrile. Urine and respiratory cultures remain with no  growth to date. Today is day #4 cefepime, vancomycin was discontinued today.  Pt showing signs of AKI with bump in Scr from wnl to > 1.5 > 1.9, would likely attribute to vancomycin and expect to improve.  Goal of Therapy:  Resolution of infection   Plan:  -Continue cefepime 2 g/24h -Watch for renal function improvement now off vancomycin -F/u goals of care -Follow duration of cefepime   Hughes Better, PharmD, BCPS Clinical Pharmacist Pager: [phone] 03/28/2014 12:17 PM

## 2014-03-29 ENCOUNTER — Inpatient Hospital Stay (HOSPITAL_COMMUNITY): Payer: Medicare Other

## 2014-03-29 LAB — CBC
HCT: 20.7 % — ABNORMAL LOW (ref 39.0–52.0)
Hemoglobin: 6.8 g/dL — CL (ref 13.0–17.0)
MCH: 31.3 pg (ref 26.0–34.0)
MCHC: 32.9 g/dL (ref 30.0–36.0)
MCV: 95.4 fL (ref 78.0–100.0)
PLATELETS: 89 10*3/uL — AB (ref 150–400)
RBC: 2.17 MIL/uL — ABNORMAL LOW (ref 4.22–5.81)
RDW: 15.1 % (ref 11.5–15.5)
WBC: 3.2 10*3/uL — AB (ref 4.0–10.5)

## 2014-03-29 LAB — BASIC METABOLIC PANEL
BUN: 51 mg/dL — ABNORMAL HIGH (ref 6–23)
CO2: 22 meq/L (ref 19–32)
Calcium: 8.3 mg/dL — ABNORMAL LOW (ref 8.4–10.5)
Chloride: 115 mEq/L — ABNORMAL HIGH (ref 96–112)
Creatinine, Ser: 2.15 mg/dL — ABNORMAL HIGH (ref 0.50–1.35)
GFR calc Af Amer: 29 mL/min — ABNORMAL LOW (ref >90)
GFR calc non Af Amer: 25 mL/min — ABNORMAL LOW (ref >90)
Glucose, Bld: 232 mg/dL — ABNORMAL HIGH (ref 70–99)
Potassium: 3.4 mEq/L — ABNORMAL LOW (ref 3.7–5.3)
Sodium: 146 mEq/L (ref 137–147)

## 2014-03-29 LAB — POCT I-STAT 3, ART BLOOD GAS (G3+)
ACID-BASE DEFICIT: 4 mmol/L — AB (ref 0.0–2.0)
Bicarbonate: 20.7 mEq/L (ref 20.0–24.0)
O2 SAT: 97 %
PO2 ART: 97 mmHg (ref 80.0–100.0)
TCO2: 22 mmol/L (ref 0–100)
pCO2 arterial: 36.7 mmHg (ref 35.0–45.0)
pH, Arterial: 7.358 (ref 7.350–7.450)

## 2014-03-29 LAB — GLUCOSE, CAPILLARY
GLUCOSE-CAPILLARY: 230 mg/dL — AB (ref 70–99)
GLUCOSE-CAPILLARY: 211 mg/dL — AB (ref 70–99)
Glucose-Capillary: 217 mg/dL — ABNORMAL HIGH (ref 70–99)
Glucose-Capillary: 218 mg/dL — ABNORMAL HIGH (ref 70–99)
Glucose-Capillary: 223 mg/dL — ABNORMAL HIGH (ref 70–99)
Glucose-Capillary: 236 mg/dL — ABNORMAL HIGH (ref 70–99)

## 2014-03-29 LAB — PREPARE RBC (CROSSMATCH)

## 2014-03-29 LAB — PHOSPHORUS
Phosphorus: 3.1 mg/dL (ref 2.3–4.6)
Phosphorus: 2.5 mg/dL (ref 2.3–4.6)

## 2014-03-29 LAB — MAGNESIUM
MAGNESIUM: 1.9 mg/dL (ref 1.5–2.5)
Magnesium: 1.9 mg/dL (ref 1.5–2.5)

## 2014-03-29 MED ORDER — CEFEPIME HCL 1 G IJ SOLR
1.0000 g | INTRAMUSCULAR | Status: AC
  Administered 2014-03-29 – 2014-03-31 (×3): 1 g via INTRAVENOUS
  Filled 2014-03-29 (×3): qty 1

## 2014-03-29 MED ORDER — POTASSIUM CHLORIDE 20 MEQ/15ML (10%) PO LIQD
40.0000 meq | Freq: Once | ORAL | Status: AC
  Administered 2014-03-29: 40 meq via ORAL
  Filled 2014-03-29: qty 30

## 2014-03-29 MED ORDER — FENTANYL CITRATE 0.05 MG/ML IJ SOLN
50.0000 ug | INTRAMUSCULAR | Status: DC | PRN
  Administered 2014-03-29 – 2014-04-02 (×23): 50 ug via INTRAVENOUS
  Filled 2014-03-29 (×23): qty 2

## 2014-03-29 NOTE — Progress Notes (Signed)
I have attempted to reach family but unsuccessful so far. Will reattempt throughout the day. Thank you for this consult and we will schedule a discussion time for goals of care as soon as possible.  Vinie Sill, NP Palliative Medicine Team Pager # [phone] (M-F 8a-5p) Team Phone # [phone] (Nights/Weekends)

## 2014-03-29 NOTE — Progress Notes (Signed)
eLink Physician-Brief Progress Note Patient Name: Peter [name] DOB: 1919-07-17 MRN: [id number]  Date of Service  03/29/2014   HPI/Events of Note   Recent Labs Lab 03/25/14 0500 03/26/14 0550 03/27/14 0305 03/28/14 0400 03/29/14 0300  HGB 8.9* 9.1* 7.3* 7.0* 6.8*    Recent Labs Lab 03/25/14 0500 03/26/14 0550 03/27/14 0305 03/28/14 0400 03/29/14 0300  PLT 190 265 132* 105* 89*   No results found for this basename: INR,  in the last 168 hours    eICU Interventions  No obvious bleed per eRN  Plan 1 unit prbc   Intervention Category Intermediate Interventions: Diagnostic test evaluation  Brand Males 03/29/2014, 4:37 AM

## 2014-03-29 NOTE — Progress Notes (Signed)
CRITICAL VALUE ALERT  Critical value received:  Hemoglogin   6.8  Date of notification:  03/29/2014  Time of notification:  04:10  Critical value read back:yes  Nurse who received alert:  Arlyss Gandy, RN  MD notified (1st page):  Dr. Chase Caller  Time of first page:  04:11  MD notified (2nd page):  Time of second page:  Responding MD:  Dr. Chase Caller  Time MD responded:  04:11

## 2014-03-29 NOTE — Progress Notes (Signed)
ANTIBIOTIC CONSULT NOTE - INITIAL  Pharmacy Consult for cefepime Indication: HAP  No Known Allergies  Patient Measurements: Height: 5\' 5"  (165.1 cm) Weight: 225 lb 5 oz (102.2 kg) IBW/kg (Calculated) : 61.5  Vital Signs: Temp: 98.4 F (36.9 C) (05/25 1015) Temp src: Oral (05/25 1015) BP: 118/48 mmHg (05/25 1207) Pulse Rate: 77 (05/25 1207) Intake/Output from previous day: 05/24 0701 - 05/25 0700 In: 1955 [I.V.:525; NG/GT:1380; IV Piggyback:50] Out: 2800 [LKJZP:9150] Intake/Output from this shift: Total I/O In: 864.5 [I.V.:12; Blood:462.5; NG/GT:390] Out: 155 [Urine:155]  Labs:  Recent Labs  03/27/14 0305 03/28/14 0400 03/29/14 0300  WBC 5.0 4.1 3.2*  HGB 7.3* 7.0* 6.8*  PLT 132* 105* 89*  CREATININE 1.54* 1.91* 2.15*   Assessment: 89 YOM admitted 5/17, CXR showed new left basilar infiltrate. WBC normal and patient afebrile. Urine and respiratory cultures remain with no growth to date. Today is day #5 cefepime, vancomycin was discontinued today.  Serum creatinine increasing.   Goal of Therapy:  Resolution of infection   Plan:  -Decrease cefepime to 1 g/24h -F/u goals of care -Follow duration of cefepime   Heide Guile, PharmD, BCPS Clinical Pharmacist Pager [phone]   03/29/2014 2:17 PM

## 2014-03-29 NOTE — Progress Notes (Signed)
PULMONARY / CRITICAL CARE MEDICINE   Name: Peter [name] MRN: [id number] DOB: 1919/05/01    ADMISSION DATE:  03/30/14 CONSULTATION DATE:  03/29/2014  REFERRING MD :  Gevena Cotton PRIMARY SERVICE: IMTS  CHIEF COMPLAINT:  PEA arrest  BRIEF PATIENT DESCRIPTION: 80 y.o with metastatic prostate cancer adm 5/17 with left HCAP/effusion, was on 4L [place] , transferred to ICU after PEA arrest on 5/22, 8 min downtime, epi x 2 PMH - metastatic prostate CA, CAD with ventricular pacer, HTN, HLD, chronic anemia    SIGNIFICANT EVENTS / STUDIES:  5/22 PEA arrest 5/22 duplex neg,           CT angio neg PE- mod bL effusions,         head CT neg  LINES / TUBES: ETT 5/22 >> Lt [place] 5/22 >>  CULTURES: resp 5/22 >> Urine 5/22 ng bld 5/22 >> ng  ANTIBIOTICS: 5/21 cefepime >> 5/21 vanc >> 5/25  SUBJECTIVE:  Remains unresponsive UO picked up with lasix afebrile  VITAL SIGNS: Temp:  [97.9 F (36.6 C)-98.7 F (37.1 C)] 97.9 F (36.6 C) (05/25 0800) Pulse Rate:  [42-112] 67 (05/25 0800) Resp:  [13-28] 14 (05/25 0800) BP: (87-162)/(33-61) 146/61 mmHg (05/25 0917) SpO2:  [99 %-100 %] 100 % (05/25 0800) Arterial Line BP: (53-196)/(37-81) 84/72 mmHg (05/25 0800) FiO2 (%):  [40 %] 40 % (05/25 0917) Weight:  [102.2 kg (225 lb 5 oz)] 102.2 kg (225 lb 5 oz) (05/25 0500) HEMODYNAMICS: CVP:  [9 mmHg-80 mmHg] 9 mmHg VENTILATOR SETTINGS: Vent Mode:  [-] CPAP;PSV FiO2 (%):  [40 %] 40 % Set Rate:  [14 bmp] 14 bmp Vt Set:  [510 mL] 510 mL PEEP:  [5 cmH20] 5 cmH20 Pressure Support:  [5 cmH20] 5 cmH20 Plateau Pressure:  [12 cmH20-19 cmH20] 12 cmH20 INTAKE / OUTPUT: Intake/Output     05/24 0701 - 05/25 0700 05/25 0701 - 05/26 0700   I.V. (mL/kg) 525 (5.1)    NG/GT 1380 90   IV Piggyback 50    Total Intake(mL/kg) 1955 (19.1) 90 (0.9)   Urine (mL/kg/hr) 1375 (0.6) 45 (0.1)   Emesis/NG output     Total Output 1375 45   Net +580 +45          PHYSICAL EXAMINATION: Gen. Chronically ill,  dishevelled ENT - no lesions, no post nasal drip, oral ETT, large beard Neck: No JVD, no thyromegaly, no carotid bruits Lungs: no use of accessory muscles, no dullness to percussion, decreased left without rales or rhonchi  Cardiovascular: Rhythm regular, heart sounds  normal, no murmurs, 2+ peripheral edema Abdomen: soft and non-tender, no hepatosplenomegaly, BS normal. Musculoskeletal: No deformities, no cyanosis or clubbing Neuro:  Unresponsive, pupils 66mm nRTL, non purposeful movements per RN Skin:  Warm, no lesions/ rash   LABS:  CBC  Recent Labs Lab 03/27/14 0305 03/28/14 0400 03/29/14 0300  WBC 5.0 4.1 3.2*  HGB 7.3* 7.0* 6.8*  HCT 22.3* 20.7* 20.7*  PLT 132* 105* 89*   Coag's No results found for this basename: APTT, INR,  in the last 168 hours BMET  Recent Labs Lab 03/27/14 0305 03/28/14 0400 03/29/14 0300  NA 149* 146 146  K 3.8 3.3* 3.4*  CL 117* 115* 115*  CO2 21 20 22   BUN 22 35* 51*  CREATININE 1.54* 1.91* 2.15*  GLUCOSE 148* 161* 232*   Electrolytes  Recent Labs Lab 03/27/14 0305  03/27/14 2250 03/28/14 0400 03/28/14 1054 03/28/14 2254 03/29/14 0300  CALCIUM 8.5  --   --  8.5  --   --  8.3*  MG  --   < > 1.8  --  1.8 1.8  --   PHOS  --   < > 2.7  --  3.3 2.8  --   < > = values in this interval not displayed. Sepsis Markers  Recent Labs Lab 03/26/14 0830  LATICACIDVEN 4.6*   ABG  Recent Labs Lab 03/26/14 1021  PHART 7.322*  PCO2ART 40.9  PO2ART 183.0*   Liver Enzymes  Recent Labs Lab 03/23/14 0612 03/26/14 0719  AST 44* 55*  ALT 18 24  ALKPHOS 156* 146*  BILITOT 1.0 0.6  ALBUMIN 2.6* 2.4*   Cardiac Enzymes  Recent Labs Lab 03/26/14 2313 03/27/14 0251 03/27/14 0851  TROPONINI 2.98* 2.84* 1.97*   Glucose  Recent Labs Lab 03/28/14 1116 03/28/14 1521 03/28/14 1931 03/28/14 2345 03/29/14 0355 03/29/14 0808  GLUCAP 178* 183* 195* 206* 217* 223*    Imaging Dg Chest Port 1 View  03/29/2014   CLINICAL  DATA:  Pleural effusions.  EXAM: PORTABLE CHEST - 1 VIEW  COMPARISON:  Mar 28, 2014.  FINDINGS: Stable cardiomegaly. Endotracheal tube is in grossly good position with distal tip 3.5 cm above the carinal. Left-sided pacemaker is unchanged in position. Nasogastric tube is seen entering the stomach. No pneumothorax is noted. Stable left basilar opacity is noted consistent with pleural effusion with probable underlying atelectasis or pneumonia. Right lung is essentially clear.  IMPRESSION: Stable left pleural effusion with probable associated atelectasis or pneumonia. Stable support apparatus.   Electronically Signed   By: Sabino Dick M.D.   On: 03/29/2014 07:44   Dg Chest Port 1 View  03/28/2014   CLINICAL DATA:  Bilateral effusions.  EXAM: PORTABLE CHEST - 1 VIEW  COMPARISON:  Radiographs 03/27/2014 and 03/26/2014.  CT 03/26/2014.  FINDINGS: 0802 hr. Endotracheal tube tip is stable within the midtrachea. Left subclavian pacemaker leads, central line and nasogastric tube are unchanged. Cardiomegaly, bilateral pleural effusions and bibasilar opacities are again noted. There appears to be increased left lower lobe airspace disease and edema compared with the most recent study. No evidence of pneumothorax.  IMPRESSION: Slight worsening of pulmonary edema and left basilar airspace disease. Persistent bilateral pleural effusions.   Electronically Signed   By: Camie Patience M.D.   On: 03/28/2014 12:36     CXR: ETT in position, BL effusions vs ASD  ASSESSMENT / PLAN:  PULMONARY A:Acute resp failure post arrest Hypoxia prior to arrest, CT angio/ duplex  neg PE P:   Tolerates SBTs but poor mental status  CARDIOVASCULAR A: PEA arrest -?etio ?ACS, peak tp 3.9 AICD Shock Echo - nml LVEF, no WMA< RV mild dilated P:  Off  levo gtt   RENAL A:  AKI Hypokalemia P:   Hydrate per CVP Diuresed with Lasix 80 x 1 Replete K  GASTROINTESTINAL A:  Dysphagia based on swallow eval 5/20 P:   Ct  TFs  HEMATOLOGIC A:  Anemia, chronic disease s/p 1U PRBC 5/25 P:  Goal Hb 7 & above   INFECTIOUS A:  HCAP P:   Ct cefepime, dc vanc  resp cx  ENDOCRINE A:  No issues, TSH nml   P:   SSI Stress dose steroids , since he was on prednisone  NEUROLOGIC A: Concern for anoxic encephalopathy , Head CT  neg P:   RASS goal: 0 fent prn Neuro input for prognostication if no improvement    TODAY'S SUMMARY: Unclear cause of PEA arrest -?new  MI Discussed with daughter Mardene Celeste & gave guarded prognosis - she does want to continue efforts in the short term Palliative care input for goals of care given poor prognosis even if his neuro status improves-although I suspect some degree of anoxic damage here   I have personally obtained a history, examined the patient, evaluated laboratory and imaging results, formulated the assessment and plan and placed orders. CRITICAL CARE: The patient is critically ill with multiple organ systems failure and requires high complexity decision making for assessment and support, frequent evaluation and titration of therapies, application of advanced monitoring technologies and extensive interpretation of multiple databases. Critical Care Time devoted to patient care services described in this note is 35 minutes.   Rigoberto Noel  Pulmonary and [place] Pager: [phone]  03/29/2014, 10:05 AM

## 2014-03-30 ENCOUNTER — Inpatient Hospital Stay (HOSPITAL_COMMUNITY): Payer: Medicare Other

## 2014-03-30 DIAGNOSIS — G9340 Encephalopathy, unspecified: Secondary | ICD-10-CM

## 2014-03-30 LAB — BASIC METABOLIC PANEL
BUN: 64 mg/dL — AB (ref 6–23)
CALCIUM: 8.7 mg/dL (ref 8.4–10.5)
CHLORIDE: 116 meq/L — AB (ref 96–112)
CO2: 22 meq/L (ref 19–32)
CREATININE: 2.18 mg/dL — AB (ref 0.50–1.35)
GFR calc Af Amer: 29 mL/min — ABNORMAL LOW (ref >90)
GFR calc non Af Amer: 25 mL/min — ABNORMAL LOW (ref >90)
Glucose, Bld: 236 mg/dL — ABNORMAL HIGH (ref 70–99)
Potassium: 3.7 mEq/L (ref 3.7–5.3)
Sodium: 149 mEq/L — ABNORMAL HIGH (ref 137–147)

## 2014-03-30 LAB — CBC
HCT: 25.1 % — ABNORMAL LOW (ref 39.0–52.0)
Hemoglobin: 8.5 g/dL — ABNORMAL LOW (ref 13.0–17.0)
MCH: 31.6 pg (ref 26.0–34.0)
MCHC: 33.9 g/dL (ref 30.0–36.0)
MCV: 93.3 fL (ref 78.0–100.0)
Platelets: 84 10*3/uL — ABNORMAL LOW (ref 150–400)
RBC: 2.69 MIL/uL — ABNORMAL LOW (ref 4.22–5.81)
RDW: 16.5 % — AB (ref 11.5–15.5)
WBC: 4.1 10*3/uL (ref 4.0–10.5)

## 2014-03-30 LAB — TYPE AND SCREEN
ABO/RH(D): O POS
ANTIBODY SCREEN: NEGATIVE
UNIT DIVISION: 0

## 2014-03-30 LAB — GLUCOSE, CAPILLARY
Glucose-Capillary: 213 mg/dL — ABNORMAL HIGH (ref 70–99)
Glucose-Capillary: 213 mg/dL — ABNORMAL HIGH (ref 70–99)
Glucose-Capillary: 177 mg/dL — ABNORMAL HIGH (ref 70–99)
Glucose-Capillary: 215 mg/dL — ABNORMAL HIGH (ref 70–99)
Glucose-Capillary: 192 mg/dL — ABNORMAL HIGH (ref 70–99)

## 2014-03-30 MED ORDER — FREE WATER
300.0000 mL | Freq: Four times a day (QID) | Status: DC
  Administered 2014-03-30 – 2014-04-01 (×8): 300 mL

## 2014-03-30 MED ORDER — FUROSEMIDE 10 MG/ML IJ SOLN
80.0000 mg | Freq: Once | INTRAMUSCULAR | Status: AC
  Administered 2014-03-30: 80 mg via INTRAVENOUS
  Filled 2014-03-30: qty 8

## 2014-03-30 MED ORDER — HYDROCORTISONE NA SUCCINATE PF 100 MG IJ SOLR
50.0000 mg | Freq: Two times a day (BID) | INTRAMUSCULAR | Status: DC
  Administered 2014-03-30 – 2014-03-31 (×2): 50 mg via INTRAVENOUS
  Filled 2014-03-30 (×4): qty 1

## 2014-03-30 NOTE — Progress Notes (Addendum)
PULMONARY / CRITICAL CARE MEDICINE   Name: Peter [name] MRN: [id number] DOB: [birth year]    ADMISSION DATE:  April 17, 2014 CONSULTATION DATE:  03/30/2014  REFERRING MD :  Gevena Cotton PRIMARY SERVICE: IMTS  CHIEF COMPLAINT:  PEA arrest  BRIEF PATIENT DESCRIPTION: 80 y.o with metastatic prostate cancer adm 5/17 with left HCAP/effusion, was on 4L [place] , transferred to ICU after PEA arrest on 5/22, 8 min downtime, epi x 2 PMH - metastatic prostate CA, CAD with ventricular pacer, HTN, HLD, chronic anemia    SIGNIFICANT EVENTS / STUDIES:  5/22 PEA arrest 5/22 duplex neg,           CT angio neg PE- mod bL effusions,         head CT neg  LINES / TUBES: ETT 5/22 >> Lt [name] 5/22 >>  CULTURES: resp 5/22 >>ng Urine 5/22 ng bld 5/22 >> ng  ANTIBIOTICS: 5/21 cefepime >> 5/21 vanc >> 5/25  SUBJECTIVE:  UO dropped off afebrile NOn specific agitaion  VITAL SIGNS: Temp:  [97.9 F (36.6 C)-98.6 F (37 C)] 98.6 F (37 C) (05/26 0827) Pulse Rate:  [67-97] 79 (05/26 1000) Resp:  [13-27] 15 (05/26 1000) BP: (95-150)/(39-81) 134/60 mmHg (05/26 1000) SpO2:  [97 %-100 %] 100 % (05/26 1000) Arterial Line BP: (109-176)/(41-77) 165/60 mmHg (05/25 1600) FiO2 (%):  [40 %] 40 % (05/26 0910) Weight:  [103.3 kg (227 lb 11.8 oz)] 103.3 kg (227 lb 11.8 oz) (05/26 0445) HEMODYNAMICS:   VENTILATOR SETTINGS: Vent Mode:  [-] PRVC FiO2 (%):  [40 %] 40 % Set Rate:  [14 bmp] 14 bmp Vt Set:  [510 mL] 510 mL PEEP:  [5 cmH20] 5 cmH20 Pressure Support:  [8 cmH20-10 cmH20] 10 cmH20 Plateau Pressure:  [17 cmH20-22 cmH20] 19 cmH20 INTAKE / OUTPUT: Intake/Output     05/25 0701 - 05/26 0700 05/26 0701 - 05/27 0700   I.V. (mL/kg) 12 (0.1) 6 (0.1)   Blood 462.5    NG/GT 1620 210   IV Piggyback 50    Total Intake(mL/kg) 2144.5 (20.8) 216 (2.1)   Urine (mL/kg/hr) 520 (0.2) 110 (0.3)   Total Output 520 110   Net +1624.5 +106          PHYSICAL EXAMINATION: Gen. Chronically ill, dishevelled ENT - no  lesions, no post nasal drip, oral ETT, large beard Neck: No JVD, no thyromegaly, no carotid bruits Lungs: no use of accessory muscles, no dullness to percussion, decreased left without rales or rhonchi  Cardiovascular: Rhythm regular, heart sounds  normal, no murmurs, 2+ peripheral edema Abdomen: soft and non-tender, no hepatosplenomegaly, BS normal. Musculoskeletal: No deformities, no cyanosis or clubbing Neuro:  Follows 1 step commands, pupils 38mm nRTL,mild agitation Skin:  Warm, no lesions/ rash   LABS:  CBC  Recent Labs Lab 03/28/14 0400 03/29/14 0300 03/30/14 0400  WBC 4.1 3.2* 4.1  HGB 7.0* 6.8* 8.5*  HCT 20.7* 20.7* 25.1*  PLT 105* 89* 84*   Coag's No results found for this basename: APTT, INR,  in the last 168 hours BMET  Recent Labs Lab 03/28/14 0400 03/29/14 0300 03/30/14 0400  NA 146 146 149*  K 3.3* 3.4* 3.7  CL 115* 115* 116*  CO2 20 22 22   BUN 35* 51* 64*  CREATININE 1.91* 2.15* 2.18*  GLUCOSE 161* 232* 236*   Electrolytes  Recent Labs Lab 03/28/14 0400  03/28/14 2254 03/29/14 0300 03/29/14 1054 03/29/14 2215 03/30/14 0400  CALCIUM 8.5  --   --  8.3*  --   --  8.7  MG  --   < > 1.8  --  1.9 1.9  --   PHOS  --   < > 2.8  --  3.1 2.5  --   < > = values in this interval not displayed. Sepsis Markers  Recent Labs Lab 03/26/14 0830  LATICACIDVEN 4.6*   ABG  Recent Labs Lab 03/26/14 1021 03/29/14 1601  PHART 7.322* 7.358  PCO2ART 40.9 36.7  PO2ART 183.0* 97.0   Liver Enzymes  Recent Labs Lab 03/26/14 0719  AST 55*  ALT 24  ALKPHOS 146*  BILITOT 0.6  ALBUMIN 2.4*   Cardiac Enzymes  Recent Labs Lab 03/26/14 2313 03/27/14 0251 03/27/14 0851  TROPONINI 2.98* 2.84* 1.97*   Glucose  Recent Labs Lab 03/29/14 1141 03/29/14 1533 03/29/14 1956 03/29/14 2354 03/30/14 0403 03/30/14 0801  GLUCAP 230* 211* 218* 236* 213* 213*    Imaging Dg Chest Port 1 View  03/30/2014   CLINICAL DATA:  Intubation.  EXAM: PORTABLE  CHEST - 1 VIEW  COMPARISON:  Mar 29, 2014.  FINDINGS: Stable cardiomediastinal silhouette. Endotracheal tube is in grossly good position with distal tip 3 cm above the carinal. Left-sided pacemaker is unchanged in position. No pneumothorax is noted stable left basilar opacity is noted concerning for edema, atelectasis or pneumonia with associated pleural effusion. Slightly increased right lower lobe opacity is noted concerning for worsening edema or pneumonia.  IMPRESSION: Stable left basilar opacity is noted concerning for pleural effusion with associated edema, pneumonia or atelectasis. Increased right lower lobe opacity is noted concerning for worsening pneumonia or edema.   Electronically Signed   By: Sabino Dick M.D.   On: 03/30/2014 08:02   Dg Chest Port 1 View  03/29/2014   CLINICAL DATA:  Pleural effusions.  EXAM: PORTABLE CHEST - 1 VIEW  COMPARISON:  Mar 28, 2014.  FINDINGS: Stable cardiomegaly. Endotracheal tube is in grossly good position with distal tip 3.5 cm above the carinal. Left-sided pacemaker is unchanged in position. Nasogastric tube is seen entering the stomach. No pneumothorax is noted. Stable left basilar opacity is noted consistent with pleural effusion with probable underlying atelectasis or pneumonia. Right lung is essentially clear.  IMPRESSION: Stable left pleural effusion with probable associated atelectasis or pneumonia. Stable support apparatus.   Electronically Signed   By: Sabino Dick M.D.   On: 03/29/2014 07:44     CXR: ETT in position, BL effusions vs ASD  ASSESSMENT / PLAN:  PULMONARY A:Acute resp failure post arrest Hypoxia prior to arrest, CT angio/ duplex  neg PE P:   Not Tolerating SBTs - poor mental status  CARDIOVASCULAR A: PEA arrest -?etio ?ACS, peak tp 3.9 AICD Shock Echo - nml LVEF, no WMA< RV mild dilated P:  Off  levo gtt   RENAL A:  AKI Hypokalemia Hypernatremia P:   Diurese with Lasix 80 x 1 Replete K Add free  water  GASTROINTESTINAL A:  Dysphagia based on swallow eval 5/20 P:   Ct TFs  HEMATOLOGIC A:  Anemia, chronic disease s/p 1U PRBC 5/25 Thrombocytopenia -plts 265 on adm P:  Goal Hb 7 & above  Hold heparin, use SCDs  INFECTIOUS A:  HCAP P:   Ct cefepime, dc vanc    ENDOCRINE A:  No issues, TSH nml   P:   SSI Stress dose steroids , since he was on prednisone  NEUROLOGIC A: Concern for anoxic encephalopathy , Head CT  Neg Follows commands 5/26 P:   RASS goal: 0 fent  prn    TODAY'S SUMMARY: Unclear cause of PEA arrest -?new MI Discussed with daughter Mardene Celeste & gave guarded prognosis - she does want to continue efforts in the short term Palliative care input for goals of care given poor prognosis even if his neuro status improves   I have personally obtained a history, examined the patient, evaluated laboratory and imaging results, formulated the assessment and plan and placed orders. CRITICAL CARE: The patient is critically ill with multiple organ systems failure and requires high complexity decision making for assessment and support, frequent evaluation and titration of therapies, application of advanced monitoring technologies and extensive interpretation of multiple databases. Critical Care Time devoted to patient care services described in this note is 35 minutes.   Gwenevere Abbot MD. FCCP. [place] [facility] Pager [phone] If no response call [phone]    03/30/2014, 10:57 AM

## 2014-03-30 NOTE — Progress Notes (Signed)
I have spoken with Peter [name] daughter, Mardene Celeste and we will meet tomorrow 5/27 1400 tomorrow afternoon. Thank you for this consult.   Vinie Sill, NP Palliative Medicine Team Pager # [phone] (M-F 8a-5p) Team Phone # [phone] (Nights/Weekends)

## 2014-03-31 ENCOUNTER — Inpatient Hospital Stay (HOSPITAL_COMMUNITY): Payer: Medicare Other

## 2014-03-31 DIAGNOSIS — J9600 Acute respiratory failure, unspecified whether with hypoxia or hypercapnia: Secondary | ICD-10-CM

## 2014-03-31 DIAGNOSIS — I469 Cardiac arrest, cause unspecified: Secondary | ICD-10-CM

## 2014-03-31 DIAGNOSIS — Z515 Encounter for palliative care: Secondary | ICD-10-CM

## 2014-03-31 LAB — GLUCOSE, CAPILLARY
GLUCOSE-CAPILLARY: 183 mg/dL — AB (ref 70–99)
GLUCOSE-CAPILLARY: 206 mg/dL — AB (ref 70–99)
GLUCOSE-CAPILLARY: 231 mg/dL — AB (ref 70–99)
Glucose-Capillary: 211 mg/dL — ABNORMAL HIGH (ref 70–99)
Glucose-Capillary: 204 mg/dL — ABNORMAL HIGH (ref 70–99)
Glucose-Capillary: 181 mg/dL — ABNORMAL HIGH (ref 70–99)

## 2014-03-31 LAB — CBC
HCT: 25.6 % — ABNORMAL LOW (ref 39.0–52.0)
Hemoglobin: 8.6 g/dL — ABNORMAL LOW (ref 13.0–17.0)
MCH: 31.5 pg (ref 26.0–34.0)
MCHC: 33.6 g/dL (ref 30.0–36.0)
MCV: 93.8 fL (ref 78.0–100.0)
PLATELETS: 92 10*3/uL — AB (ref 150–400)
RBC: 2.73 MIL/uL — AB (ref 4.22–5.81)
RDW: 16.3 % — ABNORMAL HIGH (ref 11.5–15.5)
WBC: 4.7 10*3/uL (ref 4.0–10.5)

## 2014-03-31 LAB — BASIC METABOLIC PANEL
BUN: 76 mg/dL — AB (ref 6–23)
CALCIUM: 8.7 mg/dL (ref 8.4–10.5)
CO2: 24 meq/L (ref 19–32)
Chloride: 112 mEq/L (ref 96–112)
Creatinine, Ser: 2.29 mg/dL — ABNORMAL HIGH (ref 0.50–1.35)
GFR calc Af Amer: 27 mL/min — ABNORMAL LOW (ref >90)
GFR, EST NON AFRICAN AMERICAN: 23 mL/min — AB (ref >90)
Glucose, Bld: 225 mg/dL — ABNORMAL HIGH (ref 70–99)
Potassium: 3.6 mEq/L — ABNORMAL LOW (ref 3.7–5.3)
SODIUM: 147 meq/L (ref 137–147)

## 2014-03-31 LAB — CULTURE, BLOOD (ROUTINE X 2)
Culture: NO GROWTH
Culture: NO GROWTH

## 2014-03-31 MED ORDER — SODIUM CHLORIDE 0.9 % IV SOLN
INTRAVENOUS | Status: DC
  Administered 2014-03-31 – 2014-04-03 (×3): via INTRAVENOUS
  Administered 2014-04-06: 10 mL/h via INTRAVENOUS

## 2014-03-31 NOTE — Progress Notes (Signed)
Family Medicine Teaching Service Daily Progress Note Intern Pager: [phone]  Patient name: Peter [name] Medical record number: [id number] Date of birth: Jan 09, 1919 Age: 80 y.o. Gender: male  Primary Care Provider: Lupita [name], MD Consultants: ICU Code Status: Full (determine GOC today)   Pt Overview and Major Events to Date:  5/22 PEA arrest, transferred to ICU 5/22   Assessment and Plan: 80 y.o with metastatic prostate cancer adm 5/17 with left HCAP/effusion, was on 4L [place] , transferred to ICU after PEA arrest on 5/22, 8 min downtime, epi x 2  #Acute Respiratory Failure s/p PEA arrest 2/2 ACS? - Intubated, primary management per CCM, appreciate recommendations and primary management of his care  - Continue to wean off drips - Continue to trend labs - Wean off intubation as tolerates - Palliative meeting today @ 2 PM, appreciate recommendations and help with his management   #Anoxic Brain Injury 2/2 PEA arrest - Unsure of mental status, will need to wean and assess function  - Palliative meeting today   #AKI - 2/2 decreased perfusion from arrest? - Continue to hydrate and check BMETs  #Hypernatremia - Free H20   #HCAP - Continue Cefepime, D/C Vanc - Continue Solu-Cortef   FEN/GI: Vital 24mL/hr, SCDs PPx: Protonix   Disposition: Palliative meeting today will determine future GOC and management   Subjective:  Pt intubated   Objective: Temp:  [97.9 F (36.6 C)-98.7 F (37.1 C)] 98.5 F (36.9 C) (05/27 0700) Pulse Rate:  [69-97] 70 (05/27 0700) Resp:  [13-26] 14 (05/27 0700) BP: (106-156)/(37-69) 108/52 mmHg (05/27 0700) SpO2:  [99 %-100 %] 100 % (05/27 0700) FiO2 (%):  [40 %] 40 % (05/27 0400) Weight:  [227 lb 11.8 oz (103.3 kg)] 227 lb 11.8 oz (103.3 kg) (05/27 0443) Physical Exam: Gen. Chronically ill ENT - oral ETT  Neck: No JVD Lungs: no use of accessory muscles, no dullness to percussion, decreased left without rales or rhonchi  Cardiovascular:  RRR, no murmurs appreciated Abdomen: soft and non-tender, no hepatosplenomegaly, BS normal.    Laboratory:  Recent Labs Lab 03/29/14 0300 03/30/14 0400 03/31/14 0415  WBC 3.2* 4.1 4.7  HGB 6.8* 8.5* 8.6*  HCT 20.7* 25.1* 25.6*  PLT 89* 84* 92*    Recent Labs Lab 03/26/14 0719  03/29/14 0300 03/30/14 0400 03/31/14 0415  NA 148*  < > 146 149* 147  K 4.3  < > 3.4* 3.7 3.6*  CL 114*  < > 115* 116* 112  CO2 20  < > 22 22 24   BUN 17  < > 51* 64* 76*  CREATININE 1.02  < > 2.15* 2.18* 2.29*  CALCIUM 8.4  < > 8.3* 8.7 8.7  PROT 5.8*  --   --   --   --   BILITOT 0.6  --   --   --   --   ALKPHOS 146*  --   --   --   --   ALT 24  --   --   --   --   AST 55*  --   --   --   --   GLUCOSE 143*  < > 232* 236* 225*  < > = values in this interval not displayed.   Imaging/Diagnostic Tests: 5/22 duplex neg,  CT angio neg PE- mod bL effusions,  head CT neg   Nolon Rod, DO 03/31/2014, 8:37 AM PGY-2 [place] Intern pager: [phone], text pages welcome

## 2014-03-31 NOTE — Consult Note (Signed)
Patient XB:[id number] H Creasman      DOB: 1919/09/23      AOZ:[id number]     Consult Note from the Palliative Medicine Team at [facility] Requested by: Dr. Elsworth Soho     PCP: Lupita [name], MD Reason for Consultation: [place]     Phone Number:[phone]  Assessment of patients Current state:  Peter [name] is a 80 yo male on mechanical ventilation after PEA arrest on 03/26/14 with unclear etiology. He has not been successful with vent weaning and has questionable neurological status (he does follow some commands intermittently according to nursing). PMH significant for metastatic prostate cancer, CAD, pacemaker, chronic anemia, hypertension.     I met today with daughter, Peter [name]. We discussed Peter [name]'s unfortunate situation and the possibility of a poor outcome. It is very difficult for Peter [name] to consider and process this information to consider a poor outcome and prognosis. She tells me that her father has always been very much full of life and independent. She told me that she stayed with him when he was first diagnosed with his prostate cancer to help him with his meals and take his medications properly. She says he was still able to drive independently. Peter [name] was unable to continue caring for him d/t her own health issues and he moved in with his granddaughter Peter [name] (Peter [name]'s sisters' daughter) and her husband. This arrangement was not working out for numerous reasons including his urinary incontinence which was fairly new the past ~ month. We discussed that he still has this metastatic prostate cancer (sees Dr. Alvy Bimler) and now we do not know his neurological state after PEA and downtime of ~8 minutes. We discussed code status and the importance to consider no CPR if he were to have another event and what this means. We also discussed the possibility of one way extubation and what this means and looks like. She is quite overwhelmed with all this information today so I asked her to  consider no CPR and let us know her decision and we will take each decision one at a time. I told her that we would not be doing her justice if we did not try and prepare her for a poor outcome. I also gave her a copy of [facility] Cho[facility]. I will continue to follow and support.     Goals of Care: 1.  Code Status: FULL   2. Scope of Treatment: Continue all medical treatment available and offered. No limitations on care at this point.    4. Disposition: To be determined on outcomes.    3. Symptom Management:   1. Pain: Fentanyl prn for pain/sedation while intubated.  2. Weakness: Continue medical management.   4. Psychosocial: Peter [name] tells me that their relationship has been strained but something they have been working on. She says there was always much competition for her mother's attention between her father and the girls. Peter [name] seems to rely on her father and may not have much more of a support system making this more difficult to process.     Patient Documents Completed or Given: Document Given Completed  Advanced Directives Pkt    MOST    DNR    Gone from [facility]    Hard Choices yes     Brief HPI: 80 yo male with metastatic prostate cancer and now PEA arrest on life sustaining measures. Poor prognosis.    ROS: Unable to elicit - AMS/intubated.     PMH:  Past Medical  History  Diagnosis Date  . Cardiac septal defect     closed self during childhood  . Blood transfusion     colon polypectomy, bleeding requiring transfusion  . Hx of colonic polyps 03/03/2005    adenomatous  . Obstructive sleep apnea     Does not use CPAP  . Anemia   . Diabetes mellitus   . Diverticulosis of colon   . GERD without esophagitis   . Hyperlipidemia   . Macular degeneration   . Memory loss of   . Osteoarthritis of spine   . Periumbilical hernia   . Psoriasis   . Chronic venous insufficiency   . AV block, complete   . Mobitz (type) II atrioventricular block   . Obesity,  unspecified   . Left bundle branch hemiblock   . Pure hypercholesterolemia   . Cardiac pacemaker in situ 01/18/05    1st one inserted 01/18/05. 2nd one inserted 06/06/10.  Marland Kitchen LBBB (left bundle branch block)   . Lower extremity edema     Chronic   . Fluid retention in legs 01/15/2014  . Need for pneumococcal vaccination 01/15/2014  . Prostate cancer     Urologist watchfully waiting     PSH: Past Surgical History  Procedure Laterality Date  . Tonsillectomy      Age 82  . Partial colectomy  07/09/02    For Polyps  . Pacemaker placement  01/18/05  . 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 Cataract extraction  6/12    left eye Dr Katy Fitch  . Pacemaker replacement  06/06/10    Dr Leonia Reeves   I have reviewed the [facility] and  If appropriate update it with new information. No Known Allergies Scheduled Meds: . antiseptic oral rinse  15 mL Mouth Rinse QID  . calcium-vitamin D  1 tablet Oral Q breakfast  . chlorhexidine  15 mL Mouth Rinse BID  . feeding supplement (VITAL HIGH PROTEIN)  1,000 mL Per Tube Q24H  . free water  300 mL Per Tube Q6H  . insulin aspart  0-9 Units Subcutaneous 6 times per day  . pantoprazole sodium  40 mg Per Tube Q1200  . sodium chloride  3 mL Intravenous Q12H   Continuous Infusions:  PRN Meds:.fentaNYL    BP 146/59  Pulse 91  Temp(Src) 98.2 F (36.8 C) (Oral)  Resp 18  Ht _0  (1.651 m)  Wt 103.3 kg (227 lb 11.8 oz)  BMI 37.90 kg/m2  SpO2 100%   PPS: 20%   Intake/Output Summary (Last 24 hours) at 03/31/14 1748 Last data filed at 03/31/14 1700  Gross per 24 hour  Intake   1790 ml  Output   1040 ml  Net    750 ml    Physical Exam:  General: Chronically ill appearing, NAD HEENT: [place]/AT, no JVD, poor dentition, ETT, OGT, moist mucous membranes Chest: Decreased bases, rhonchi, slightly labored on pressure support CVS: RRR, S1 S2, ventricular paced Abdomen: Soft, obese, NT, ND, +BS Ext: Extremities 2-3+ edema, no movement Neuro: Responds to  voice, does not track, not following commands (follows simple commands intermittently according to nursing)  Labs: CBC    Component Value Date/Time   WBC 4.7 03/31/2014 0415   WBC 4.5 02/24/2014 1443   RBC 2.73* 03/31/2014 0415   RBC 2.69* 02/24/2014 1443   HGB 8.6* 03/31/2014 0415   HGB 8.7* 02/24/2014 1443   HCT 25.6* 03/31/2014 0415   HCT 25.5* 02/24/2014 1443  PLT 92* 03/31/2014 0415   PLT 173 02/24/2014 1443   MCV 93.8 03/31/2014 0415   MCV 94.6 02/24/2014 1443   MCH 31.5 03/31/2014 0415   MCH 32.2 02/24/2014 1443   MCHC 33.6 03/31/2014 0415   MCHC 34.1 02/24/2014 1443   RDW 16.3* 03/31/2014 0415   RDW 14.6 02/24/2014 1443   LYMPHSABS 0.6* 03/27/2014 0305   LYMPHSABS 0.8* 02/24/2014 1443   MONOABS 0.3 03/27/2014 0305   MONOABS 0.3 02/24/2014 1443   EOSABS 0.0 03/27/2014 0305   EOSABS 0.2 02/24/2014 1443   BASOSABS 0.0 03/27/2014 0305   BASOSABS 0.0 02/24/2014 1443    BMET    Component Value Date/Time   NA 147 03/31/2014 0415   NA 139 02/24/2014 1443   K 3.6* 03/31/2014 0415   K 3.9 02/24/2014 1443   CL 112 03/31/2014 0415   CL 103 04/23/2013 1238   CO2 24 03/31/2014 0415   CO2 26 02/24/2014 1443   GLUCOSE 225* 03/31/2014 0415   GLUCOSE 186* 02/24/2014 1443   GLUCOSE 186* 04/23/2013 1238   BUN 76* 03/31/2014 0415   BUN 17.4 02/24/2014 1443   CREATININE 2.29* 03/31/2014 0415   CREATININE 1.0 02/24/2014 1443   CREATININE 0.99 02/12/2012 1546   CALCIUM 8.7 03/31/2014 0415   CALCIUM 9.2 02/24/2014 1443   GFRNONAA 23* 03/31/2014 0415   GFRAA 27* 03/31/2014 0415    CMP     Component Value Date/Time   NA 147 03/31/2014 0415   NA 139 02/24/2014 1443   K 3.6* 03/31/2014 0415   K 3.9 02/24/2014 1443   CL 112 03/31/2014 0415   CL 103 04/23/2013 1238   CO2 24 03/31/2014 0415   CO2 26 02/24/2014 1443   GLUCOSE 225* 03/31/2014 0415   GLUCOSE 186* 02/24/2014 1443   GLUCOSE 186* 04/23/2013 1238   BUN 76* 03/31/2014 0415   BUN 17.4 02/24/2014 1443   CREATININE 2.29* 03/31/2014 0415   CREATININE 1.0 02/24/2014 1443    CREATININE 0.99 02/12/2012 1546   CALCIUM 8.7 03/31/2014 0415   CALCIUM 9.2 02/24/2014 1443   PROT 5.8* 03/26/2014 0719   PROT 7.0 02/24/2014 1443   ALBUMIN 2.4* 03/26/2014 0719   ALBUMIN 3.1* 02/24/2014 1443   AST 55* 03/26/2014 0719   AST 26 02/24/2014 1443   ALT 24 03/26/2014 0719   ALT 10 02/24/2014 1443   ALKPHOS 146* 03/26/2014 0719   ALKPHOS 262* 02/24/2014 1443   BILITOT 0.6 03/26/2014 0719   BILITOT 0.71 02/24/2014 1443   GFRNONAA 23* 03/31/2014 0415   GFRAA 27* 03/31/2014 0415     Time In Time Out Total Time Spent with Patient Total Overall Time  1400 1540 39mn 1071m    Greater than 50%  of this time was spent counseling and coordinating care related to the above assessment and plan.  AlVinie SillNP Palliative Medicine Team Pager # [phone]-F 8a-5p) Team Phone # [phone]/Weekends)

## 2014-03-31 NOTE — Progress Notes (Signed)
PULMONARY / CRITICAL CARE MEDICINE   Name: Peter [name] MRN: [id number] DOB: December 26, 1918    ADMISSION DATE:  04-06-2014 CONSULTATION DATE:  03/31/2014  REFERRING MD :  Gevena Cotton PRIMARY SERVICE: IMTS  CHIEF COMPLAINT:  PEA arrest  BRIEF PATIENT DESCRIPTION: 80 y.o with metastatic prostate cancer adm 5/17 with left HCAP/effusion, was on 4L [place] , transferred to ICU after PEA arrest on 5/22, 8 min downtime, epi x 2 PMH - metastatic prostate CA, CAD with ventricular pacer, HTN, HLD, chronic anemia    SIGNIFICANT EVENTS / STUDIES:  5/22 PEA arrest 5/22 duplex neg,           CT angio neg PE- mod bL effusions,         head CT neg  LINES / TUBES: ETT 5/22 >> Lt [place] 5/22 >>  CULTURES: resp 5/22 >>ng Urine 5/22 ng bld 5/22 >> ng  ANTIBIOTICS: 5/21 cefepime >> 5/21 vanc >> 5/25  SUBJECTIVE:  UO improved with lasix then dropped off afebrile NOn specific agitation - guppy breathing this am that resolved after mucus plug suctioned out  VITAL SIGNS: Temp:  [97.9 F (36.6 C)-98.7 F (37.1 C)] 98.4 F (36.9 C) (05/27 1228) Pulse Rate:  [69-101] 76 (05/27 1500) Resp:  [11-25] 15 (05/27 1500) BP: (106-160)/(37-68) 130/49 mmHg (05/27 1500) SpO2:  [97 %-100 %] 100 % (05/27 1500) FiO2 (%):  [40 %] 40 % (05/27 1200) Weight:  [103.3 kg (227 lb 11.8 oz)] 103.3 kg (227 lb 11.8 oz) (05/27 0443) HEMODYNAMICS:   VENTILATOR SETTINGS: Vent Mode:  [-] CPAP;PSV FiO2 (%):  [40 %] 40 % Set Rate:  [14 bmp] 14 bmp Vt Set:  [510 mL] 510 mL PEEP:  [5 cmH20] 5 cmH20 Pressure Support:  [12 cmH20-15 cmH20] 12 cmH20 Plateau Pressure:  [17 cmH20-24 cmH20] 18 cmH20 INTAKE / OUTPUT: Intake/Output     05/26 0701 - 05/27 0700 05/27 0701 - 05/28 0700   I.V. (mL/kg) 6 (0.1)    Blood     NG/GT 1530 780   IV Piggyback 50    Total Intake(mL/kg) 1586 (15.4) 780 (7.6)   Urine (mL/kg/hr) 1810 (0.7) 250 (0.3)   Total Output 1810 250   Net -224 +530          PHYSICAL EXAMINATION: Gen.  Chronically ill, dishevelled ENT - no lesions, no post nasal drip, oral ETT, large beard Neck: No JVD, no thyromegaly, no carotid bruits Lungs: no use of accessory muscles, no dullness to percussion, decreased left without rales or rhonchi  Cardiovascular: Rhythm regular, heart sounds  normal, no murmurs, 2+ peripheral edema Abdomen: soft and non-tender, no hepatosplenomegaly, BS normal. Musculoskeletal: No deformities, no cyanosis or clubbing Neuro:  Follows 1 step commands, pupils 48mm nRTL,mild agitation Skin:  Warm, no lesions/ rash   LABS:  CBC  Recent Labs Lab 03/29/14 0300 03/30/14 0400 03/31/14 0415  WBC 3.2* 4.1 4.7  HGB 6.8* 8.5* 8.6*  HCT 20.7* 25.1* 25.6*  PLT 89* 84* 92*   Coag's No results found for this basename: APTT, INR,  in the last 168 hours BMET  Recent Labs Lab 03/29/14 0300 03/30/14 0400 03/31/14 0415  NA 146 149* 147  K 3.4* 3.7 3.6*  CL 115* 116* 112  CO2 22 22 24   BUN 51* 64* 76*  CREATININE 2.15* 2.18* 2.29*  GLUCOSE 232* 236* 225*   Electrolytes  Recent Labs Lab 03/28/14 2254 03/29/14 0300 03/29/14 1054 03/29/14 2215 03/30/14 0400 03/31/14 0415  CALCIUM  --  8.3*  --   --  8.7 8.7  MG 1.8  --  1.9 1.9  --   --   PHOS 2.8  --  3.1 2.5  --   --    Sepsis Markers  Recent Labs Lab 03/26/14 0830  LATICACIDVEN 4.6*   ABG  Recent Labs Lab 03/26/14 1021 03/29/14 1601  PHART 7.322* 7.358  PCO2ART 40.9 36.7  PO2ART 183.0* 97.0   Liver Enzymes  Recent Labs Lab 03/26/14 0719  AST 55*  ALT 24  ALKPHOS 146*  BILITOT 0.6  ALBUMIN 2.4*   Cardiac Enzymes  Recent Labs Lab 03/26/14 2313 03/27/14 0251 03/27/14 0851  TROPONINI 2.98* 2.84* 1.97*   Glucose  Recent Labs Lab 03/30/14 1623 03/30/14 2002 03/31/14 0011 03/31/14 0402 03/31/14 0759 03/31/14 1143  GLUCAP 215* 192* 183* 231* 211* 204*    Imaging Dg Chest Port 1 View  03/31/2014   CLINICAL DATA:  Intubation.  EXAM: PORTABLE CHEST - 1 VIEW   COMPARISON:  03/10/2014.  FINDINGS: Endotracheal tube, NG tube in stable position. Low lung volumes. Persistent left lower lobe infiltrate is present. Developing right lower lobe infiltrate cannot be excluded. Left pleural effusion cannot be excluded. No pneumothorax. Cardiac pacer lead tips in the right atrium and right ventricle. Normal pulmonary vascularity.  IMPRESSION: 1. Line and tube position stable. 2. Persistent left lower lobe infiltrate. Probable small left pleural effusion. 3. Developing right lower lobe infiltrate cannot be excluded.   Electronically Signed   By: Marcello Moores  Register   On: 03/31/2014 07:15   Dg Chest Port 1 View  03/30/2014   CLINICAL DATA:  Intubation.  EXAM: PORTABLE CHEST - 1 VIEW  COMPARISON:  Mar 29, 2014.  FINDINGS: Stable cardiomediastinal silhouette. Endotracheal tube is in grossly good position with distal tip 3 cm above the carinal. Left-sided pacemaker is unchanged in position. No pneumothorax is noted stable left basilar opacity is noted concerning for edema, atelectasis or pneumonia with associated pleural effusion. Slightly increased right lower lobe opacity is noted concerning for worsening edema or pneumonia.  IMPRESSION: Stable left basilar opacity is noted concerning for pleural effusion with associated edema, pneumonia or atelectasis. Increased right lower lobe opacity is noted concerning for worsening pneumonia or edema.   Electronically Signed   By: Sabino Dick M.D.   On: 03/30/2014 08:02     CXR: ETT in position, BL effusions vs ASD  ASSESSMENT / PLAN:  PULMONARY A:Acute resp failure post arrest Hypoxia prior to arrest, CT angio/ duplex  neg PE P:   Making limited progress withSBTs - poor mental status also precludes extubation Not a tstomy candidate  CARDIOVASCULAR A: PEA arrest -?etio ?ACS, peak tp 3.9 AICD Shock Echo - nml LVEF, no WMA< RV mild dilated P:  Off  levo gtt   RENAL A:  AKI Hypokalemia Hypernatremia P:   Hold   Lasix Replete K  ct free water  GASTROINTESTINAL A:  Dysphagia based on swallow eval 5/20 P:   Ct TFs  HEMATOLOGIC A:  Anemia, chronic disease s/p 1U PRBC 5/25 Thrombocytopenia -plts 265 on adm P:  Goal Hb 7 & above  Hold heparin, use SCDs, HIT pending  INFECTIOUS A:  HCAP P:   Ct cefepime, dc vanc    ENDOCRINE A:  No issues, TSH nml   P:   SSI Stress dose steroids , since he was on prednisone  NEUROLOGIC A: Concern for anoxic encephalopathy , Head CT  Neg Follows commands 5/26 P:   RASS goal: 0 fent prn  TODAY'S SUMMARY: Unclear cause of PEA arrest -?new MI Ongoing discussions with daughter Mardene Celeste & gave guarded prognosis - she does want to continue efforts in the short term Appreciate Palliative care input for goals of care given poor prognosis even if his neuro status improves   I have personally obtained a history, examined the patient, evaluated laboratory and imaging results, formulated the assessment and plan and placed orders. CRITICAL CARE: The patient is critically ill with multiple organ systems failure and requires high complexity decision making for assessment and support, frequent evaluation and titration of therapies, application of advanced monitoring technologies and extensive interpretation of multiple databases. Critical Care Time devoted to patient care services described in this note is 35 minutes.   Gwenevere Abbot MD. FCCP. [facility] Pager [phone] If no response call [phone]    03/31/2014, 3:57 PM

## 2014-03-31 NOTE — Progress Notes (Signed)
Full note to follow:  I met today with daughter, Mardene Celeste. We discussed Mr. Spruiell's unfortunate situation and the possibility of a poor outcome. It is very difficult for Mardene Celeste to consider and process this information. We discussed code status and the importance to consider no CPR if he were to have another event and what this means. We also discussed the possibility of extubation with no reintubation if this fails. She is quite overwhelmed with all this information today so I asked her to consider no CPR and let us know her decision and we will take each decision one at a time. I told her that we would not be doing her justice if we did not try and prepare her for a poor outcome. I gave her a copy of [facility] Choices. I will continue to follow and support.   Vinie Sill, NP Palliative Medicine Team Pager # [phone] (M-F 8a-5p) Team Phone # [phone] (Nights/Weekends)

## 2014-04-01 ENCOUNTER — Inpatient Hospital Stay (HOSPITAL_COMMUNITY): Payer: Medicare Other

## 2014-04-01 LAB — GLUCOSE, CAPILLARY
GLUCOSE-CAPILLARY: 154 mg/dL — AB (ref 70–99)
GLUCOSE-CAPILLARY: 160 mg/dL — AB (ref 70–99)
GLUCOSE-CAPILLARY: 168 mg/dL — AB (ref 70–99)
Glucose-Capillary: 161 mg/dL — ABNORMAL HIGH (ref 70–99)
Glucose-Capillary: 161 mg/dL — ABNORMAL HIGH (ref 70–99)
Glucose-Capillary: 175 mg/dL — ABNORMAL HIGH (ref 70–99)

## 2014-04-01 LAB — BASIC METABOLIC PANEL
BUN: 87 mg/dL — AB (ref 6–23)
CHLORIDE: 110 meq/L (ref 96–112)
CO2: 25 meq/L (ref 19–32)
Calcium: 8.7 mg/dL (ref 8.4–10.5)
Creatinine, Ser: 2.33 mg/dL — ABNORMAL HIGH (ref 0.50–1.35)
GFR calc non Af Amer: 23 mL/min — ABNORMAL LOW (ref >90)
GFR, EST AFRICAN AMERICAN: 26 mL/min — AB (ref >90)
Glucose, Bld: 151 mg/dL — ABNORMAL HIGH (ref 70–99)
Potassium: 3.4 mEq/L — ABNORMAL LOW (ref 3.7–5.3)
Sodium: 145 mEq/L (ref 137–147)

## 2014-04-01 LAB — CBC
HEMATOCRIT: 25.7 % — AB (ref 39.0–52.0)
Hemoglobin: 8.5 g/dL — ABNORMAL LOW (ref 13.0–17.0)
MCH: 31.1 pg (ref 26.0–34.0)
MCHC: 33.1 g/dL (ref 30.0–36.0)
MCV: 94.1 fL (ref 78.0–100.0)
Platelets: 117 10*3/uL — ABNORMAL LOW (ref 150–400)
RBC: 2.73 MIL/uL — ABNORMAL LOW (ref 4.22–5.81)
RDW: 16.4 % — ABNORMAL HIGH (ref 11.5–15.5)
WBC: 6.3 10*3/uL (ref 4.0–10.5)

## 2014-04-01 MED ORDER — POTASSIUM CHLORIDE 20 MEQ/15ML (10%) PO LIQD
ORAL | Status: AC
  Filled 2014-04-01: qty 30

## 2014-04-01 MED ORDER — POTASSIUM CHLORIDE 20 MEQ/15ML (10%) PO LIQD
40.0000 meq | Freq: Once | ORAL | Status: AC
  Administered 2014-04-01: 40 meq
  Filled 2014-04-01: qty 30

## 2014-04-01 MED ORDER — SORBITOL 70 % SOLN
30.0000 mL | Freq: Every day | Status: DC | PRN
  Filled 2014-04-01: qty 30

## 2014-04-01 MED ORDER — FREE WATER
200.0000 mL | Freq: Four times a day (QID) | Status: DC
  Administered 2014-04-01 – 2014-04-08 (×28): 200 mL

## 2014-04-01 NOTE — Progress Notes (Signed)
PULMONARY / CRITICAL CARE MEDICINE   Name: Peter [name] MRN: [id number] DOB: 07/07/1919    ADMISSION DATE:  03/16/2014 CONSULTATION DATE:  04/01/2014  REFERRING MD :  Gevena Cotton PRIMARY SERVICE: IMTS  CHIEF COMPLAINT:  PEA arrest  BRIEF PATIENT DESCRIPTION: 80 y.o with metastatic prostate cancer adm 5/17 with left HCAP/effusion, was on 4L [place] , transferred to ICU after PEA arrest on 5/22, 8 min downtime, epi x 2 PMH - metastatic prostate CA, CAD with ventricular pacer, HTN, HLD, chronic anemia    SIGNIFICANT EVENTS / STUDIES:  5/22 PEA arrest 5/22 duplex neg,           CT angio neg PE- mod bL effusions,         head CT neg  LINES / TUBES: ETT 5/22 >> Lt [place] 5/22 >>  CULTURES: resp 5/22 >>ng Urine 5/22 ng bld 5/22 >> ng  ANTIBIOTICS: 5/21 cefepime >> 5/21 vanc >> 5/25  SUBJECTIVE:  UO improved remains poor off lasix afebrile Intermittent agitation  VITAL SIGNS: Temp:  [98.2 F (36.8 C)-99 F (37.2 C)] 98.4 F (36.9 C) (05/28 1100) Pulse Rate:  [70-91] 89 (05/28 1233) Resp:  [14-20] 18 (05/28 1233) BP: (77-150)/(29-86) 134/57 mmHg (05/28 1233) SpO2:  [97 %-100 %] 99 % (05/28 1233) FiO2 (%):  [40 %] 40 % (05/28 1233) Weight:  [104.2 kg (229 lb 11.5 oz)] 104.2 kg (229 lb 11.5 oz) (05/28 0400) HEMODYNAMICS:   VENTILATOR SETTINGS: Vent Mode:  [-] PRVC FiO2 (%):  [40 %] 40 % Set Rate:  [14 bmp] 14 bmp Vt Set:  [510 mL] 510 mL PEEP:  [5 cmH20] 5 cmH20 Pressure Support:  [12 cmH20] 12 cmH20 Plateau Pressure:  [13 cmH20-19 cmH20] 19 cmH20 INTAKE / OUTPUT: Intake/Output     05/27 0701 - 05/28 0700 05/28 0701 - 05/29 0700   I.V. (mL/kg) 130 (1.2) 50 (0.5)   NG/GT 1740 500   IV Piggyback 50    Total Intake(mL/kg) 1920 (18.4) 550 (5.3)   Urine (mL/kg/hr) 635 (0.3) 125 (0.2)   Total Output 635 125   Net +1285 +425          PHYSICAL EXAMINATION: Gen. Chronically ill, dishevelled ENT - no lesions, no post nasal drip, oral ETT, large beard Neck: No JVD,  no thyromegaly, no carotid bruits Lungs: no use of accessory muscles, no dullness to percussion, decreased BL without rales or rhonchi  Cardiovascular: Rhythm regular, heart sounds  normal, no murmurs, 2+ peripheral edema Abdomen: soft and non-tender, no hepatosplenomegaly, BS normal. Musculoskeletal: No deformities, no cyanosis or clubbing Neuro:  Follows 1 step commands, pupils 28mm nRTL,mild agitation Skin:  Warm, no lesions/ rash   LABS:  CBC  Recent Labs Lab 03/30/14 0400 03/31/14 0415 04/01/14 0420  WBC 4.1 4.7 6.3  HGB 8.5* 8.6* 8.5*  HCT 25.1* 25.6* 25.7*  PLT 84* 92* 117*   Coag's No results found for this basename: APTT, INR,  in the last 168 hours BMET  Recent Labs Lab 03/30/14 0400 03/31/14 0415 04/01/14 0420  NA 149* 147 145  K 3.7 3.6* 3.4*  CL 116* 112 110  CO2 22 24 25   BUN 64* 76* 87*  CREATININE 2.18* 2.29* 2.33*  GLUCOSE 236* 225* 151*   Electrolytes  Recent Labs Lab 03/28/14 2254  03/29/14 1054 03/29/14 2215 03/30/14 0400 03/31/14 0415 04/01/14 0420  CALCIUM  --   < >  --   --  8.7 8.7 8.7  MG 1.8  --  1.9  1.9  --   --   --   PHOS 2.8  --  3.1 2.5  --   --   --   < > = values in this interval not displayed. Sepsis Markers  Recent Labs Lab 03/26/14 0830  LATICACIDVEN 4.6*   ABG  Recent Labs Lab 03/26/14 1021 03/29/14 1601  PHART 7.322* 7.358  PCO2ART 40.9 36.7  PO2ART 183.0* 97.0   Liver Enzymes  Recent Labs Lab 03/26/14 0719  AST 55*  ALT 24  ALKPHOS 146*  BILITOT 0.6  ALBUMIN 2.4*   Cardiac Enzymes  Recent Labs Lab 03/26/14 2313 03/27/14 0251 03/27/14 0851  TROPONINI 2.98* 2.84* 1.97*   Glucose  Recent Labs Lab 03/31/14 1556 03/31/14 1926 04/01/14 0016 04/01/14 0347 04/01/14 0817 04/01/14 1131  GLUCAP 206* 181* 160* 154* 161* 161*    Imaging Dg Chest Port 1 View  04/01/2014   CLINICAL DATA:  Respiratory distress  EXAM: PORTABLE CHEST - 1 VIEW  COMPARISON:  03/31/2014  FINDINGS: Cardiac shadow  remains enlarged. Endotracheal tube is noted in satisfactory position. A nasogastric catheter courses into the stomach. A pacing device is again seen. Bilateral pleural effusions are better visualized on the current exam. Underlying atelectasis is likely present. No focal confluent infiltrate is identified. The left subclavian central line is again seen and stable.  IMPRESSION: Bilateral pleural effusions are better visualized on the current exam. Underlying atelectasis is likely present.   Electronically Signed   By: Inez Catalina M.D.   On: 04/01/2014 08:12   Dg Chest Port 1 View  03/31/2014   CLINICAL DATA:  Intubation.  EXAM: PORTABLE CHEST - 1 VIEW  COMPARISON:  03/10/2014.  FINDINGS: Endotracheal tube, NG tube in stable position. Low lung volumes. Persistent left lower lobe infiltrate is present. Developing right lower lobe infiltrate cannot be excluded. Left pleural effusion cannot be excluded. No pneumothorax. Cardiac pacer lead tips in the right atrium and right ventricle. Normal pulmonary vascularity.  IMPRESSION: 1. Line and tube position stable. 2. Persistent left lower lobe infiltrate. Probable small left pleural effusion. 3. Developing right lower lobe infiltrate cannot be excluded.   Electronically Signed   By: Marcello Moores  Register   On: 03/31/2014 07:15     CXR: ETT in position, BL effusions vs ASD  ASSESSMENT / PLAN:  PULMONARY A:Acute resp failure post arrest Hypoxia prior to arrest, CT angio/ duplex  neg PE P:   Making limited progress with SBTs - poor mental status also precludes extubation Not a tstomy candidate  CARDIOVASCULAR A: PEA arrest -?etio ?ACS, peak tp 3.9 AICD Shock Echo - nml LVEF, no WMA< RV mild dilated P:  Off  levo gtt   RENAL A:  AKI Hypokalemia Hypernatremia P:   Hold  Lasix due to soft BP Replete K  ct free water  GASTROINTESTINAL A:  Dysphagia based on swallow eval 5/20 P:   Ct TFs  HEMATOLOGIC A:  Anemia, chronic disease s/p 1U PRBC  5/25 Thrombocytopenia -plts 265 on adm P:  Goal Hb 7 & above  Hold heparin, use SCDs, HIT pending  INFECTIOUS A:  HCAP P:   Ct cefepime, dc vanc    ENDOCRINE A:  No issues, TSH nml   P:   SSI Stress dose steroids , since he was on prednisone  NEUROLOGIC A: Concern for anoxic encephalopathy , Head CT  Neg Follows commands 5/26 P:   RASS goal: 0 fent prn    TODAY'S SUMMARY: Unclear cause of PEA arrest -?new  MI Ongoing discussions with daughter Mardene Celeste & gave guarded prognosis - she does want to continue efforts in the short term Appreciate Palliative care input for goals of care given poor prognosis even if his neuro status improves. Not a candidate for Tstomy, would not offer   I have personally obtained a history, examined the patient, evaluated laboratory and imaging results, formulated the assessment and plan and placed orders. CRITICAL CARE: The patient is critically ill with multiple organ systems failure and requires high complexity decision making for assessment and support, frequent evaluation and titration of therapies, application of advanced monitoring technologies and extensive interpretation of multiple databases. Critical Care Time devoted to patient care services described in this note is 35 minutes.   Gwenevere Abbot MD. FCCP. [facility] [facility] Pager [phone] If no response call [phone]    04/01/2014, 2:06 PM

## 2014-04-01 NOTE — Progress Notes (Signed)
NUTRITION FOLLOW UP  Intervention:    Continue 70M PEPuP Protocol with Vital High Protein at goal rate of 60 ml/h (1440 ml per day) to provide 1440 kcals (23 kcals/kg ideal weight), 126 gm protein, 1204 ml free water daily  New Nutrition Dx:  Inadequate oral intake related to inability to eat as evidenced by NPO status, ongoing  New Goal:   Enteral nutrition to provide 60-70% of estimated calorie needs (22-25 kcals/kg ideal body weight) and 100% of estimated protein needs, based on ASPEN guidelines for permissive underfeeding in critically ill obese individuals, met.  Monitor:   TF tolerance/adequacy, weight trend, labs, vent status, overall goals of care.  Assessment:   PMHx significant for squamous cell CA, osteoporosis, HLD. Admitted s/p fall down x 4 days. Work-up ongoing.  Patient previously on dysphagia 2 diet with nectar thick liquids with very poor intake of meals. Coded on 2023/04/24 and transferred to the MICU.   Patient is currently intubated on ventilator support  MV: 7.2 L/min Temp (24hrs), Avg:98.5 F (36.9 C), Min:98.2 F (36.8 C), Max:99 F (37.2 C)   Discussed patient in ICU rounds today. Patient with poor prognosis. Palliative Care Team following patient, daughter wishes to continue full medical treatment at this time.  Patient is receiving Vital High Protein at 60 ml/h (1440 ml per day) to provide 1440 kcals (23 kcals/kg ideal weight), 126 gm protein, 1204 ml free water daily and free water flushes 200 ml every 6 hours.  Height: Ht Readings from Last 1 Encounters:  04-23-14 $RemoveB'5\' 5"'[id number]$  (1.651 m)    Weight Status:   Wt Readings from Last 1 Encounters:  04/01/14 229 lb 11.5 oz (104.2 kg)  03/23/14  203 lb 9.6 oz (92.352 kg)  03/24/2014 195 lb 5.2 oz (88.6 kg)  BMI=32.5 (using admission weight)  Re-estimated needs:  Kcal: 1581 Protein: 124 gm Fluid: 1.7-2 L  Skin: weeping skin, 3-4+ edema  Diet Order: NPO   Intake/Output Summary (Last 24 hours) at 04/01/14  1142 Last data filed at 04/01/14 1000  Gross per 24 hour  Intake   1890 ml  Output    565 ml  Net   1325 ml    Last BM: unknown  Labs:   Recent Labs Lab 03/28/14 2254  03/29/14 1054 03/29/14 2215 03/30/14 0400 03/31/14 0415 04/01/14 0420  NA  --   < >  --   --  149* 147 145  K  --   < >  --   --  3.7 3.6* 3.4*  CL  --   < >  --   --  116* 112 110  CO2  --   < >  --   --  $R'22 24 25  'GB$ BUN  --   < >  --   --  64* 76* 87*  CREATININE  --   < >  --   --  2.18* 2.29* 2.33*  CALCIUM  --   < >  --   --  8.7 8.7 8.7  MG 1.8  --  1.9 1.9  --   --   --   PHOS 2.8  --  3.1 2.5  --   --   --   GLUCOSE  --   < >  --   --  236* 225* 151*  < > = values in this interval not displayed.  CBG (last 3)   Recent Labs  04/01/14 0016 04/01/14 0347 04/01/14 0817  GLUCAP 160* 154* 161*  Scheduled Meds: . antiseptic oral rinse  15 mL Mouth Rinse QID  . calcium-vitamin D  1 tablet Oral Q breakfast  . chlorhexidine  15 mL Mouth Rinse BID  . feeding supplement (VITAL HIGH PROTEIN)  1,000 mL Per Tube Q24H  . free water  200 mL Per Tube Q6H  . insulin aspart  0-9 Units Subcutaneous 6 times per day  . pantoprazole sodium  40 mg Per Tube Q1200  . sodium chloride  3 mL Intravenous Q12H    Continuous Infusions: . sodium chloride 10 mL/hr at 03/31/14 2214    Molli Barrows, RD, [place], [place] Pager [phone] After Hours Pager [phone]

## 2014-04-01 NOTE — Consult Note (Signed)
I have reviewed this case with our NP and agree with the Assessment and Plan as stated.  Alexey Rhoads L. Vandella Ord, MD MBA The Palliative Medicine Team at [place] Team Phone: [phone] Pager: [phone]   

## 2014-04-01 NOTE — Progress Notes (Signed)
Progress Note from the Palliative Medicine Team at [facility]  I attempted to reach daughter, Peter [name] today without success. I also attempted to reach granddaughter, Peter [name] without success. I did ask Peter [name] yesterday if Peter [name] had a HCPOA and she said if he does she does not know where it is. I do see back in chart records that granddaughter, Peter [name], was noted to be HCPOA but I do not have any papers to evidence this. I will continue to try and reach Peter [name] and/or [employer] tomorrow to continue our discussion for goals of care.   Vinie Sill, NP Palliative Medicine Team Pager # [phone] (M-F 8a-5p) Team Phone # [phone] (Nights/Weekends)

## 2014-04-01 NOTE — Progress Notes (Signed)
RT bagged patient and lavaged patient and obtained moderate tan and pink tinged secretions.

## 2014-04-02 ENCOUNTER — Inpatient Hospital Stay (HOSPITAL_COMMUNITY): Payer: Medicare Other

## 2014-04-02 LAB — CBC
HCT: 24.8 % — ABNORMAL LOW (ref 39.0–52.0)
Hemoglobin: 8.3 g/dL — ABNORMAL LOW (ref 13.0–17.0)
MCH: 31.7 pg (ref 26.0–34.0)
MCHC: 33.5 g/dL (ref 30.0–36.0)
MCV: 94.7 fL (ref 78.0–100.0)
PLATELETS: 107 10*3/uL — AB (ref 150–400)
RBC: 2.62 MIL/uL — AB (ref 4.22–5.81)
RDW: 16.5 % — AB (ref 11.5–15.5)
WBC: 7.2 10*3/uL (ref 4.0–10.5)

## 2014-04-02 LAB — HEPARIN INDUCED THROMBOCYTOPENIA PNL
HEPARIN INDUCED PLT AB: NEGATIVE
Patient O.D.: 0.026
UFH High Dose UFH H: 0 % Release
UFH LOW DOSE 0.1 IU/ML: 0 %
UFH Low Dose 0.5 IU/mL: 3 % Release
UFH SRA Result: NEGATIVE

## 2014-04-02 LAB — BASIC METABOLIC PANEL
BUN: 101 mg/dL — ABNORMAL HIGH (ref 6–23)
CALCIUM: 8.5 mg/dL (ref 8.4–10.5)
CO2: 25 mEq/L (ref 19–32)
Chloride: 108 mEq/L (ref 96–112)
Creatinine, Ser: 2.42 mg/dL — ABNORMAL HIGH (ref 0.50–1.35)
GFR calc Af Amer: 25 mL/min — ABNORMAL LOW (ref >90)
GFR, EST NON AFRICAN AMERICAN: 22 mL/min — AB (ref >90)
Glucose, Bld: 194 mg/dL — ABNORMAL HIGH (ref 70–99)
Potassium: 4.2 mEq/L (ref 3.7–5.3)
SODIUM: 144 meq/L (ref 137–147)

## 2014-04-02 LAB — GLUCOSE, CAPILLARY
GLUCOSE-CAPILLARY: 175 mg/dL — AB (ref 70–99)
GLUCOSE-CAPILLARY: 180 mg/dL — AB (ref 70–99)
GLUCOSE-CAPILLARY: 189 mg/dL — AB (ref 70–99)
GLUCOSE-CAPILLARY: 203 mg/dL — AB (ref 70–99)
Glucose-Capillary: 198 mg/dL — ABNORMAL HIGH (ref 70–99)
Glucose-Capillary: 166 mg/dL — ABNORMAL HIGH (ref 70–99)

## 2014-04-02 MED ORDER — FUROSEMIDE 10 MG/ML IJ SOLN
80.0000 mg | Freq: Once | INTRAMUSCULAR | Status: AC
  Administered 2014-04-02: 80 mg via INTRAVENOUS
  Filled 2014-04-02: qty 8

## 2014-04-02 MED ORDER — DOCUSATE SODIUM 50 MG/5ML PO LIQD
100.0000 mg | Freq: Every day | ORAL | Status: DC
  Administered 2014-04-02 – 2014-04-07 (×6): 100 mg
  Filled 2014-04-02 (×7): qty 10

## 2014-04-02 MED ORDER — INSULIN GLARGINE 100 UNIT/ML ~~LOC~~ SOLN
10.0000 [IU] | Freq: Every day | SUBCUTANEOUS | Status: DC
  Administered 2014-04-02 – 2014-04-07 (×6): 10 [IU] via SUBCUTANEOUS
  Filled 2014-04-02 (×8): qty 0.1

## 2014-04-02 MED ORDER — SODIUM CHLORIDE 0.9 % IJ SOLN
10.0000 mL | Freq: Two times a day (BID) | INTRAMUSCULAR | Status: DC
  Administered 2014-04-02 – 2014-04-07 (×10): 10 mL via INTRAVENOUS
  Administered 2014-04-08: 20 mL via INTRAVENOUS
  Administered 2014-04-08 – 2014-04-09 (×2): 10 mL via INTRAVENOUS
  Administered 2014-04-09: 20 mL via INTRAVENOUS
  Administered 2014-04-10 – 2014-04-11 (×3): 10 mL via INTRAVENOUS

## 2014-04-02 NOTE — Progress Notes (Signed)
I spoke with Mr. Mallis granddaughter, Arrie Aran, who says she is his HCPOA and is planning to bring the paperwork this weekend. Mardene Celeste is unaware that Arrie Aran is his HCPOA as they were trying not to hurt her. I initiated conversations with [name] concerning his poor prognosis and that decisions will need to be made regarding one way extubation and code status. [name] initially says that he has been very clear that he wants everything done. After I spoke with her about potential neurological deficits and quality of life she seems to understand the dilemma in continuing aggressive measures and open to further discussion. I gave her the [phone] number in case of any needs over the weekend.  I also spoke to Brandon who says that she has not had a chance to think about any of the things we have previously discussed. I told her that I was going to call [place].  I will continue to follow-up on Monday. Discussed these findings with nursing and Dr. Elsworth Soho.  Vinie Sill, NP Palliative Medicine Team Pager # [phone] (M-F 8a-5p) Team Phone # [phone] (Nights/Weekends)

## 2014-04-02 NOTE — Clinical Social Work Note (Addendum)
CSW continuing to follow.  Disposition remains: [facility] when medically stable.  Nonnie Done, [place] [phone]  Clinical Social Work

## 2014-04-02 NOTE — Progress Notes (Signed)
PULMONARY / CRITICAL CARE MEDICINE   Name: Peter [name] MRN: [id number] DOB: [birth year]    ADMISSION DATE:  04/19/2014 CONSULTATION DATE:  04/02/2014  REFERRING MD :  Gevena Cotton PRIMARY SERVICE: IMTS  CHIEF COMPLAINT:  PEA arrest  BRIEF PATIENT DESCRIPTION: 80 y.o with metastatic prostate cancer adm 5/17 with left HCAP/effusion, was on 4L [place] , transferred to ICU after PEA arrest on 5/22, 8 min downtime, epi x 2 PMH - metastatic prostate CA, CAD with ventricular pacer, HTN, HLD, chronic anemia    SIGNIFICANT EVENTS / STUDIES:  5/22 PEA arrest 5/22 duplex neg,           CT angio neg PE- mod bL effusions,         head CT neg  LINES / TUBES: ETT 5/22 >> Lt [place] 5/22 >>  CULTURES: resp 5/22 >>ng Urine 5/22 ng bld 5/22 >> ng  ANTIBIOTICS: 5/21 cefepime >> 5/29 5/21 vanc >> 5/25  SUBJECTIVE:  UO remains poor off lasix afebrile More awake with Intermittent agitation  VITAL SIGNS: Temp:  [98 F (36.7 C)-99.3 F (37.4 C)] 99.3 F (37.4 C) (05/29 1200) Pulse Rate:  [70-100] 78 (05/29 1600) Resp:  [14-27] 18 (05/29 1600) BP: (86-149)/(34-85) 124/53 mmHg (05/29 1600) SpO2:  [98 %-100 %] 100 % (05/29 1600) FiO2 (%):  [40 %] 40 % (05/29 1107) Weight:  [106.7 kg (235 lb 3.7 oz)] 106.7 kg (235 lb 3.7 oz) (05/29 0500) HEMODYNAMICS:   VENTILATOR SETTINGS: Vent Mode:  [-] PRVC FiO2 (%):  [40 %] 40 % Set Rate:  [14 bmp] 14 bmp Vt Set:  [510 mL] 510 mL PEEP:  [5 cmH20] 5 cmH20 Pressure Support:  [10 cmH20] 10 cmH20 Plateau Pressure:  [19 cmH20-23 cmH20] 22 cmH20 INTAKE / OUTPUT: Intake/Output     05/28 0701 - 05/29 0700 05/29 0701 - 05/30 0700   I.V. (mL/kg) 240 (2.2) 90 (0.8)   NG/GT 1840 760   IV Piggyback     Total Intake(mL/kg) 2080 (19.5) 850 (8)   Urine (mL/kg/hr) 710 (0.3) 550 (0.5)   Total Output 710 550   Net +1370 +300          PHYSICAL EXAMINATION: Gen. Chronically ill, dishevelled ENT - no lesions, no post nasal drip, oral ETT, large beard Neck:  No JVD, no thyromegaly, no carotid bruits Lungs: no use of accessory muscles, no dullness to percussion, decreased BL without rales or rhonchi  Cardiovascular: Rhythm regular, heart sounds  normal, no murmurs, 2+ peripheral edema Abdomen: soft and non-tender, no hepatosplenomegaly, BS normal. Musculoskeletal: No deformities, no cyanosis or clubbing Neuro:  Follows 1 step commands, pupils 37mm nRTL,mild agitation Skin:  Warm, no lesions/ rash   LABS:  CBC  Recent Labs Lab 03/31/14 0415 04/01/14 0420 04/02/14 0500  WBC 4.7 6.3 7.2  HGB 8.6* 8.5* 8.3*  HCT 25.6* 25.7* 24.8*  PLT 92* 117* 107*   Coag's No results found for this basename: APTT, INR,  in the last 168 hours BMET  Recent Labs Lab 03/31/14 0415 04/01/14 0420 04/02/14 0500  NA 147 145 144  K 3.6* 3.4* 4.2  CL 112 110 108  CO2 24 25 25   BUN 76* 87* 101*  CREATININE 2.29* 2.33* 2.42*  GLUCOSE 225* 151* 194*   Electrolytes  Recent Labs Lab 03/28/14 2254  03/29/14 1054 03/29/14 2215  03/31/14 0415 04/01/14 0420 04/02/14 0500  CALCIUM  --   < >  --   --   < > 8.7 8.7 8.5  MG 1.8  --  1.9 1.9  --   --   --   --   PHOS 2.8  --  3.1 2.5  --   --   --   --   < > = values in this interval not displayed. Sepsis Markers No results found for this basename: LATICACIDVEN, PROCALCITON, O2SATVEN,  in the last 168 hours ABG  Recent Labs Lab 03/29/14 1601  PHART 7.358  PCO2ART 36.7  PO2ART 97.0   Liver Enzymes No results found for this basename: AST, ALT, ALKPHOS, BILITOT, ALBUMIN,  in the last 168 hours Cardiac Enzymes  Recent Labs Lab 03/26/14 2313 03/27/14 0251 03/27/14 0851  TROPONINI 2.98* 2.84* 1.97*   Glucose  Recent Labs Lab 04/01/14 1625 04/01/14 1942 04/02/14 0019 04/02/14 0407 04/02/14 0822 04/02/14 1140  GLUCAP 168* 175* 180* 198* 166* 189*    Imaging Dg Chest Port 1 View  04/02/2014   ADDENDUM REPORT: 04/02/2014 07:39  ADDENDUM: Nasogastric tube is only in the distal  esophagus.  These results will be called to the ordering clinician or representative by the Radiologist Assistant, and communication documented in the PACS or zVision Dashboard.   Electronically Signed   By: Nelson Chimes M.D.   On: 04/02/2014 07:39   04/02/2014   CLINICAL DATA:  Bilateral effusions.  Ventilator support.  EXAM: PORTABLE CHEST - 1 VIEW  COMPARISON:  04/01/2014 and previous  FINDINGS: Endotracheal tube has its tip 4 cm above the carina. Nasogastric tube has its tip only in the distal esophagus. The lead pacemaker appears unchanged. Bilateral effusions for cyst with volume loss in the lower lobes. Patchy perihilar density persists which could be pneumonia or edema. Little change since the immediate prior film.  IMPRESSION: Lines and tubes well positioned.  Persistent effusions and lower lobe volume loss. Persistent perihilar density consistent with edema and or pneumonia.  Electronically Signed: By: Nelson Chimes M.D. On: 04/02/2014 07:33   Dg Chest Port 1 View  04/01/2014   CLINICAL DATA:  Respiratory distress  EXAM: PORTABLE CHEST - 1 VIEW  COMPARISON:  03/31/2014  FINDINGS: Cardiac shadow remains enlarged. Endotracheal tube is noted in satisfactory position. A nasogastric catheter courses into the stomach. A pacing device is again seen. Bilateral pleural effusions are better visualized on the current exam. Underlying atelectasis is likely present. No focal confluent infiltrate is identified. The left subclavian central line is again seen and stable.  IMPRESSION: Bilateral pleural effusions are better visualized on the current exam. Underlying atelectasis is likely present.   Electronically Signed   By: Inez Catalina M.D.   On: 04/01/2014 08:12   Dg Abd Portable 1v  04/02/2014   CLINICAL DATA:  OG tube placement  EXAM: PORTABLE ABDOMEN - 1 VIEW  COMPARISON:  Prior abdominal radiograph 03/26/2014  FINDINGS: Stable position of the orogastric tube. The tip of the tube is in the right upper quadrant  in the region of the pre-pyloric gastric antrum or first portion of the duodenum. No evidence of bowel obstruction. Trace residual barium is present within the colon.  IMPRESSION: 1. Stable position of orogastric tube. The tip overlies the right upper quadrant likely within the pre-pyloric gastric antrum or first portion of the duodenum. 2. Small volume of retained barium throughout the colon. No evidence of obstruction.   Electronically Signed   By: Jacqulynn Cadet M.D.   On: 04/02/2014 08:32     CXR: ETT in position, BL effusions vs ASD  ASSESSMENT / PLAN:  PULMONARY  A:Acute resp failure post arrest Hypoxia prior to arrest, CT angio/ duplex  neg PE P:   Making limited progress with SBTs - poor mental status also precludes extubation Not a tstomy candidate  CARDIOVASCULAR A: PEA arrest -?etio ?ACS, peak tp 3.9 AICD Shock Echo - nml LVEF, no WMA< RV mild dilated P:  Off  levo gtt   RENAL A:  AKI Hypokalemia Hypernatremia P:   Rpt Lasix 80 mg  -unfortunately BUN rising Replete K  ct free water  GASTROINTESTINAL A:  Dysphagia based on swallow eval 5/20 P:   Ct TFs  HEMATOLOGIC A:  Anemia, chronic disease s/p 1U PRBC 5/25 Thrombocytopenia -plts 265 on adm, nadir 84k, now improving P:  Goal Hb 7 & above  Hold heparin, use SCDs, HIT pending  INFECTIOUS A:  HCAP P:   Ct cefepime, dc vanc    ENDOCRINE A:  No issues, TSH nml   P:   SSI Stress dose steroids , since he was on prednisone  NEUROLOGIC A: Concern for anoxic encephalopathy , Head CT  Neg Follows commands 5/26 P:   RASS goal: 0 fent prn    TODAY'S SUMMARY: Unclear cause of PEA arrest - Ongoing discussions with daughter Mardene Celeste & gave guarded prognosis - she does want to continue efforts in the short term Appreciate Palliative care input for goals of care given poor prognosis even if his neuro status improves - Seems grand daughter is POA & would continue palliative conversations with her over  weekend Not a candidate for Tstomy, would not offer   I have personally obtained a history, examined the patient, evaluated laboratory and imaging results, formulated the assessment and plan and placed orders. CRITICAL CARE: The patient is critically ill with multiple organ systems failure and requires high complexity decision making for assessment and support, frequent evaluation and titration of therapies, application of advanced monitoring technologies and extensive interpretation of multiple databases. Critical Care Time devoted to patient care services described in this note is 35 minutes.   Gwenevere Abbot MD. FCCP. [place] [facility] Pager [phone] If no response call [phone]    04/02/2014, 4:47 PM

## 2014-04-03 ENCOUNTER — Inpatient Hospital Stay (HOSPITAL_COMMUNITY): Payer: Medicare Other

## 2014-04-03 LAB — GLUCOSE, CAPILLARY
GLUCOSE-CAPILLARY: 154 mg/dL — AB (ref 70–99)
GLUCOSE-CAPILLARY: 155 mg/dL — AB (ref 70–99)
Glucose-Capillary: 153 mg/dL — ABNORMAL HIGH (ref 70–99)
Glucose-Capillary: 154 mg/dL — ABNORMAL HIGH (ref 70–99)
Glucose-Capillary: 176 mg/dL — ABNORMAL HIGH (ref 70–99)
Glucose-Capillary: 170 mg/dL — ABNORMAL HIGH (ref 70–99)

## 2014-04-03 LAB — CBC
HEMATOCRIT: 23.8 % — AB (ref 39.0–52.0)
Hemoglobin: 8 g/dL — ABNORMAL LOW (ref 13.0–17.0)
MCH: 31.7 pg (ref 26.0–34.0)
MCHC: 33.6 g/dL (ref 30.0–36.0)
MCV: 94.4 fL (ref 78.0–100.0)
PLATELETS: 117 10*3/uL — AB (ref 150–400)
RBC: 2.52 MIL/uL — ABNORMAL LOW (ref 4.22–5.81)
RDW: 16.3 % — AB (ref 11.5–15.5)
WBC: 7.4 10*3/uL (ref 4.0–10.5)

## 2014-04-03 LAB — BASIC METABOLIC PANEL
BUN: 105 mg/dL — AB (ref 6–23)
CO2: 25 mEq/L (ref 19–32)
CREATININE: 2.49 mg/dL — AB (ref 0.50–1.35)
Calcium: 8.4 mg/dL (ref 8.4–10.5)
Chloride: 109 mEq/L (ref 96–112)
GFR, EST AFRICAN AMERICAN: 24 mL/min — AB (ref >90)
GFR, EST NON AFRICAN AMERICAN: 21 mL/min — AB (ref >90)
Glucose, Bld: 160 mg/dL — ABNORMAL HIGH (ref 70–99)
Potassium: 4.2 mEq/L (ref 3.7–5.3)
Sodium: 145 mEq/L (ref 137–147)

## 2014-04-03 MED ORDER — FUROSEMIDE 10 MG/ML IJ SOLN
40.0000 mg | Freq: Three times a day (TID) | INTRAMUSCULAR | Status: AC
  Administered 2014-04-03 – 2014-04-04 (×3): 40 mg via INTRAVENOUS
  Filled 2014-04-03 (×3): qty 4

## 2014-04-03 NOTE — Progress Notes (Signed)
Copy of patient's POA paperwork was placed in his paper file on unit.  Olevia Perches, RN

## 2014-04-03 NOTE — Progress Notes (Signed)
Spoke with Pt's grand-daughter.  She has HCPOA.  She is an Therapist, sports.  Reports that her aunt has psychiatric disorders, and not able to make appropriate medical decisions for pt.  Grand-daughter has requested to change to DNR status.  She would like to update remainder of family about her decision, and then likely proceed with vent withdrawal in next few days.  Chesley Mires, MD [facility] Pulmonary/Critical Care 04/03/2014, 2:54 PM Pager:  [phone] After 3pm call: [phone]

## 2014-04-03 NOTE — Progress Notes (Signed)
PULMONARY / CRITICAL CARE MEDICINE   Name: CLIFFORD BENNINGER MRN: [id number] DOB: [birth year]    ADMISSION DATE:  03/23/2014 CONSULTATION DATE:  04/03/2014  REFERRING MD :  Gevena Cotton  CHIEF COMPLAINT:  PEA arrest  BRIEF PATIENT DESCRIPTION: 80 y.o with metastatic prostate cancer adm 5/17 with left HCAP/effusion, was on 4L [place] , transferred to ICU after PEA arrest on 5/22, 8 min downtime, epi x 2  PMH - metastatic prostate CA, CAD with ventricular pacer, HTN, HLD, chronic anemia   SIGNIFICANT EVENTS / STUDIES:  5/22 PEA arrest 5/22 duplex neg, CT angio neg PE- mod bL effusions, head CT neg  LINES / TUBES: ETT 5/22 >> Lt [place] 5/22 >>  CULTURES: resp 5/22 >>ng Urine 5/22 ng bld 5/22 >> ng  ANTIBIOTICS: 5/21 cefepime >> 5/29 5/21 vanc >> 5/25  SUBJECTIVE:   awake with Intermittent agitation.  Not tolerating SBT.  VITAL SIGNS: Temp:  [98.6 F (37 C)-99.3 F (37.4 C)] 98.6 F (37 C) (05/30 0810) Pulse Rate:  [70-102] 102 (05/30 0908) Resp:  [14-34] 34 (05/30 0908) BP: (75-136)/(30-85) 94/47 mmHg (05/30 0810) SpO2:  [99 %-100 %] 99 % (05/30 0908) FiO2 (%):  [40 %] 40 % (05/30 0908) Weight:  [106.6 kg (235 lb 0.2 oz)] 106.6 kg (235 lb 0.2 oz) (05/30 0500) VENTILATOR SETTINGS: Vent Mode:  [-] PRVC FiO2 (%):  [40 %] 40 % Set Rate:  [14 bmp] 14 bmp Vt Set:  [510 mL] 510 mL PEEP:  [5 cmH20] 5 cmH20 Pressure Support:  [10 cmH20] 10 cmH20 Plateau Pressure:  [14 cmH20-22 cmH20] 18 cmH20 INTAKE / OUTPUT: Intake/Output     05/29 0701 - 05/30 0700 05/30 0701 - 05/31 0700   I.V. (mL/kg) 240 (2.3) 10 (0.1)   NG/GT 2290 100   Total Intake(mL/kg) 2530 (23.7) 110 (1)   Urine (mL/kg/hr) 1745 (0.7) 125 (0.4)   Total Output 1745 125   Net +785 -15          PHYSICAL EXAMINATION: Gen: Chronically ill, dishevelled ENT: oral ETT, large beard Lungs: scattered rhonchi Cardiovascular: Rhythm regular, heart sounds  normal, no murmurs, 2+ peripheral edema Abdomen: soft and  non-tender, no hepatosplenomegaly, BS normal. Musculoskeletal: No deformities, no cyanosis or clubbing Neuro:  Follows 1 step commands, pupils 70mm nRTL, mild agitation Skin:  Warm, no lesions/ rash   LABS:  CBC  Recent Labs Lab 04/01/14 0420 04/02/14 0500 04/03/14 0438  WBC 6.3 7.2 7.4  HGB 8.5* 8.3* 8.0*  HCT 25.7* 24.8* 23.8*  PLT 117* 107* 117*   BMET  Recent Labs Lab 04/01/14 0420 04/02/14 0500 04/03/14 0438  NA 145 144 145  K 3.4* 4.2 4.2  CL 110 108 109  CO2 25 25 25   BUN 87* 101* 105*  CREATININE 2.33* 2.42* 2.49*  GLUCOSE 151* 194* 160*   Electrolytes  Recent Labs Lab 03/28/14 2254  03/29/14 1054 03/29/14 2215  04/01/14 0420 04/02/14 0500 04/03/14 0438  CALCIUM  --   < >  --   --   < > 8.7 8.5 8.4  MG 1.8  --  1.9 1.9  --   --   --   --   PHOS 2.8  --  3.1 2.5  --   --   --   --   < > = values in this interval not displayed.  ABG  Recent Labs Lab 03/29/14 1601  PHART 7.358  PCO2ART 36.7  PO2ART 97.0   Glucose  Recent Labs Lab 04/02/14  1140 04/02/14 1617 04/02/14 1951 04/03/14 0036 04/03/14 0422 04/03/14 0728  GLUCAP 189* 203* 175* 153* 154* 154*    Imaging Dg Chest Port 1 View  04/03/2014   CLINICAL DATA:  Followup effusions  EXAM: PORTABLE CHEST - 1 VIEW  COMPARISON:  04/02/2014  FINDINGS: An endotracheal tube, nasogastric catheter and pacemaker are again seen and stable in appearance. The cardiac shadow is enlarged but stable. Bilateral pleural effusions are again seen. The overall appearance is stable. No new focal infiltrate is seen. There is been some improved appearance in the perihilar density seen previously.  IMPRESSION: Persistent effusions.  The perihilar changes have improved when compared with the prior study.   Electronically Signed   By: Inez Catalina M.D.   On: 04/03/2014 07:13   Dg Chest Port 1 View  04/02/2014   ADDENDUM REPORT: 04/02/2014 07:39  ADDENDUM: Nasogastric tube is only in the distal esophagus.  These  results will be called to the ordering clinician or representative by the Radiologist Assistant, and communication documented in the PACS or zVision Dashboard.   Electronically Signed   By: Nelson Chimes M.D.   On: 04/02/2014 07:39   04/02/2014   CLINICAL DATA:  Bilateral effusions.  Ventilator support.  EXAM: PORTABLE CHEST - 1 VIEW  COMPARISON:  04/01/2014 and previous  FINDINGS: Endotracheal tube has its tip 4 cm above the carina. Nasogastric tube has its tip only in the distal esophagus. The lead pacemaker appears unchanged. Bilateral effusions for cyst with volume loss in the lower lobes. Patchy perihilar density persists which could be pneumonia or edema. Little change since the immediate prior film.  IMPRESSION: Lines and tubes well positioned.  Persistent effusions and lower lobe volume loss. Persistent perihilar density consistent with edema and or pneumonia.  Electronically Signed: By: Nelson Chimes M.D. On: 04/02/2014 07:33   Dg Abd Portable 1v  04/02/2014   CLINICAL DATA:  OG tube placement  EXAM: PORTABLE ABDOMEN - 1 VIEW  COMPARISON:  Prior abdominal radiograph 03/26/2014  FINDINGS: Stable position of the orogastric tube. The tip of the tube is in the right upper quadrant in the region of the pre-pyloric gastric antrum or first portion of the duodenum. No evidence of bowel obstruction. Trace residual barium is present within the colon.  IMPRESSION: 1. Stable position of orogastric tube. The tip overlies the right upper quadrant likely within the pre-pyloric gastric antrum or first portion of the duodenum. 2. Small volume of retained barium throughout the colon. No evidence of obstruction.   Electronically Signed   By: Jacqulynn Cadet M.D.   On: 04/02/2014 08:32     CXR: ETT in position, BL R>L  effusions vs ASD. W/ some improvement   ASSESSMENT / PLAN:  PULMONARY A: Acute resp failure post arrest Hypoxia prior to arrest, CT angio/ duplex  neg PE/DVT P:   Making limited progress with  SBTs - poor mental status also precludes extubation Not a tstomy candidate Continue supportive care   CARDIOVASCULAR A:  PEA arrest -?etio ?ACS, peak tp 3.9 AICD Shock-->off pressors  Echo - nml LVEF, no WMA< RV mild dilated P:  Continue tele Even to negative fluid balance as tolerated  RENAL A:   AKI--> cr a little worse  Hypernatremia P:   Hold lasix cnt free water  F/u chem in am   GASTROINTESTINAL A:   Dysphagia based on swallow eval 5/20 P:   Ct TFs  HEMATOLOGIC A:   Anemia, chronic disease s/p 1U PRBC 5/25  Thrombocytopenia -plts 265 on adm, nadir 84k, now improving P:  Goal Hb 7 & above  Hold heparin, use SCDs, HIT pending  INFECTIOUS A:   HCAP P:   Completed abx, trend fever curve  ENDOCRINE A:   No issues, TSH nml   P:   SSI Stress dose steroids , since he was on prednisone  NEUROLOGIC A:  Concern for anoxic encephalopathy , Head CT  Neg Follows commands 5/26 P:   RASS goal: 0 fent prn  TODAY'S SUMMARY: Unclear cause of PEA arrest -Ongoing discussions with daughter Mardene Celeste & gave guarded prognosis - she does want to continue efforts in the short term. Not a candidate for Tstomy, would not offer. Will cont supportive care. Will need to cont ongoing discussion about comfort oriented care   04/03/2014, 10:12 AM   Reviewed above, examined, and agree.  Not making any progress with vent weaning.  Will need to d/w family when available about goals of care.  CC time 35 minutes.  Chesley Mires, MD [facility] Pulmonary/Critical Care 04/03/2014, 11:32 AM Pager:  [phone] After 3pm call: [phone]

## 2014-04-04 LAB — GLUCOSE, CAPILLARY
GLUCOSE-CAPILLARY: 159 mg/dL — AB (ref 70–99)
GLUCOSE-CAPILLARY: 167 mg/dL — AB (ref 70–99)
GLUCOSE-CAPILLARY: 175 mg/dL — AB (ref 70–99)
Glucose-Capillary: 185 mg/dL — ABNORMAL HIGH (ref 70–99)
Glucose-Capillary: 147 mg/dL — ABNORMAL HIGH (ref 70–99)
Glucose-Capillary: 128 mg/dL — ABNORMAL HIGH (ref 70–99)

## 2014-04-04 LAB — CBC
HCT: 23.3 % — ABNORMAL LOW (ref 39.0–52.0)
HEMOGLOBIN: 7.7 g/dL — AB (ref 13.0–17.0)
MCH: 31.3 pg (ref 26.0–34.0)
MCHC: 33 g/dL (ref 30.0–36.0)
MCV: 94.7 fL (ref 78.0–100.0)
Platelets: 120 10*3/uL — ABNORMAL LOW (ref 150–400)
RBC: 2.46 MIL/uL — AB (ref 4.22–5.81)
RDW: 16.3 % — ABNORMAL HIGH (ref 11.5–15.5)
WBC: 6.1 10*3/uL (ref 4.0–10.5)

## 2014-04-04 LAB — BASIC METABOLIC PANEL
BUN: 118 mg/dL — ABNORMAL HIGH (ref 6–23)
CO2: 27 mEq/L (ref 19–32)
Calcium: 8.2 mg/dL — ABNORMAL LOW (ref 8.4–10.5)
Chloride: 104 mEq/L (ref 96–112)
Creatinine, Ser: 2.46 mg/dL — ABNORMAL HIGH (ref 0.50–1.35)
GFR calc Af Amer: 24 mL/min — ABNORMAL LOW (ref >90)
GFR, EST NON AFRICAN AMERICAN: 21 mL/min — AB (ref >90)
GLUCOSE: 170 mg/dL — AB (ref 70–99)
POTASSIUM: 4 meq/L (ref 3.7–5.3)
SODIUM: 142 meq/L (ref 137–147)

## 2014-04-04 MED ORDER — FENTANYL CITRATE 0.05 MG/ML IJ SOLN
12.5000 ug | INTRAMUSCULAR | Status: DC | PRN
  Administered 2014-04-05: 25 ug via INTRAVENOUS
  Administered 2014-04-05 – 2014-04-06 (×2): 50 ug via INTRAVENOUS
  Filled 2014-04-04 (×3): qty 2

## 2014-04-04 NOTE — Significant Event (Signed)
    To whom it may concern,   This letter is to confirm that Peter [name] is a patient in the medical intensive care at [facility]. He is critically ill and remains in a comatose state following a cardiac arrest. We do not expect him to survive this illness. He is unresponsive and unable to communicate. We do not expect this to improve. His current list of diagnoses include:   Acute cardiac arrest (remains on full mechanical ventilation support)  S/p cardiac arrest   Anemia   Pneumonia   Acute encephalopathy/ comatose state  Geriatric failure to thrive  Protein calorie malnutrition.   We plan to withdraw life support in the coming days when all family arrive. At that point the focus of care will be palliative until he passes away.   Thank you for your support in this matter  Marni Griffon ACNP-BC [place], MD [facility] Pulmonary/Critical Care 04/04/2014, 11:50 AM

## 2014-04-04 NOTE — Progress Notes (Signed)
PULMONARY / CRITICAL CARE MEDICINE   Name: Peter [name] MRN: [id number] DOB: [birth year]    ADMISSION DATE:  03/18/2014 CONSULTATION DATE:  04/04/2014  REFERRING MD :  Gevena Cotton  CHIEF COMPLAINT:  PEA arrest  BRIEF PATIENT DESCRIPTION: 80 y.o with metastatic prostate cancer adm 5/17 with left HCAP/effusion, was on 4L [place] , transferred to ICU after PEA arrest on 5/22, 8 min downtime, epi x 2  PMH - metastatic prostate CA, CAD with ventricular pacer, HTN, HLD, chronic anemia   SIGNIFICANT EVENTS / STUDIES:  5/22 PEA arrest 5/22 duplex neg, CT angio neg PE- mod bL effusions, head CT neg  LINES / TUBES: ETT 5/22 >> Lt [place] 5/22 >>  CULTURES: resp 5/22 >>ng Urine 5/22 ng bld 5/22 >> ng  ANTIBIOTICS: 5/21 cefepime >> 5/29 5/21 vanc >> 5/25  SUBJECTIVE:   awake with Intermittent agitation.  Not improved .  VITAL SIGNS: Temp:  [98.3 F (36.8 C)-98.9 F (37.2 C)] 98.3 F (36.8 C) (05/31 0807) Pulse Rate:  [70-96] 75 (05/31 0800) Resp:  [14-25] 14 (05/31 0800) BP: (81-142)/(33-66) 102/41 mmHg (05/31 0800) SpO2:  [97 %-100 %] 100 % (05/31 0800) FiO2 (%):  [40 %] 40 % (05/31 0848) Weight:  [105.6 kg (232 lb 12.9 oz)] 105.6 kg (232 lb 12.9 oz) (05/31 0413) VENTILATOR SETTINGS: Vent Mode:  [-] CPAP;PSV FiO2 (%):  [40 %] 40 % Set Rate:  [14 bmp] 14 bmp Vt Set:  [510 mL] 510 mL PEEP:  [5 cmH20] 5 cmH20 Pressure Support:  [10 cmH20] 10 cmH20 Plateau Pressure:  [17 cmH20-21 cmH20] 18 cmH20 INTAKE / OUTPUT: Intake/Output     05/30 0701 - 05/31 0700 05/31 0701 - 06/01 0700   I.V. (mL/kg) 230 (2.2)    NG/GT 1620    Total Intake(mL/kg) 1850 (17.5)    Urine (mL/kg/hr) 2250 (0.9)    Total Output 2250     Net -400            PHYSICAL EXAMINATION: Gen: Chronically ill, dishevelled, no improvement  ENT: oral ETT, large beard Lungs: scattered rhonchi Cardiovascular: Rhythm regular, heart sounds  normal, no murmurs, 2+ peripheral edema Abdomen: soft and non-tender, no  hepatosplenomegaly, BS normal. Musculoskeletal: No deformities, no cyanosis or clubbing Neuro:  Follows 1 step commands, pupils 20mm nRTL, mild agitation Skin:  Warm, no lesions/ rash   LABS:  CBC  Recent Labs Lab 04/02/14 0500 04/03/14 0438 04/04/14 0308  WBC 7.2 7.4 6.1  HGB 8.3* 8.0* 7.7*  HCT 24.8* 23.8* 23.3*  PLT 107* 117* 120*   BMET  Recent Labs Lab 04/02/14 0500 04/03/14 0438 04/04/14 0308  NA 144 145 142  K 4.2 4.2 4.0  CL 108 109 104  CO2 25 25 27   BUN 101* 105* 118*  CREATININE 2.42* 2.49* 2.46*  GLUCOSE 194* 160* 170*   Electrolytes  Recent Labs Lab 03/28/14 2254  03/29/14 1054 03/29/14 2215  04/02/14 0500 04/03/14 0438 04/04/14 0308  CALCIUM  --   < >  --   --   < > 8.5 8.4 8.2*  MG 1.8  --  1.9 1.9  --   --   --   --   PHOS 2.8  --  3.1 2.5  --   --   --   --   < > = values in this interval not displayed.  ABG  Recent Labs Lab 03/29/14 1601  PHART 7.358  PCO2ART 36.7  PO2ART 97.0   Glucose  Recent Labs  Lab 04/03/14 1130 04/03/14 1533 04/03/14 1928 04/03/14 2349 04/04/14 0400 04/04/14 0722  GLUCAP 176* 170* 155* 159* 167* 185*    Imaging Dg Chest Port 1 View  04/03/2014   CLINICAL DATA:  Followup effusions  EXAM: PORTABLE CHEST - 1 VIEW  COMPARISON:  04/02/2014  FINDINGS: An endotracheal tube, nasogastric catheter and pacemaker are again seen and stable in appearance. The cardiac shadow is enlarged but stable. Bilateral pleural effusions are again seen. The overall appearance is stable. No new focal infiltrate is seen. There is been some improved appearance in the perihilar density seen previously.  IMPRESSION: Persistent effusions.  The perihilar changes have improved when compared with the prior study.   Electronically Signed   By: Inez Catalina M.D.   On: 04/03/2014 07:13     CXR: no new CXR    ASSESSMENT / PLAN:  PULMONARY A: Acute resp failure post arrest Hypoxia prior to arrest, CT angio/ duplex  neg PE/DVT P:    Making limited progress with SBTs - poor mental status also precludes extubation Not a tstomy candidate Continue supportive care   CARDIOVASCULAR A:  PEA arrest -?etio ?ACS, peak tp 3.9 AICD Shock-->off pressors  Echo - nml LVEF, no WMA< RV mild dilated P:  Continue tele Even to negative fluid balance as tolerated  RENAL A:   AKI--> cr a little worse  Hypernatremia P:   Hold lasix cnt free water  F/u chem in am   GASTROINTESTINAL A:   Dysphagia based on swallow eval 5/20 P:   Ct TFs  HEMATOLOGIC A:   Anemia, chronic disease s/p 1U PRBC 5/25 Thrombocytopenia -plts 265 on adm, nadir 84k, now improving P:  Goal Hb 7 & above  Hold heparin, use SCDs, HIT pending  INFECTIOUS A:   HCAP P:   Completed abx, trend fever curve  ENDOCRINE A:   No issues, TSH nml   P:   SSI Stress dose steroids , since he was on prednisone  NEUROLOGIC A:  Concern for anoxic encephalopathy , Head CT  Neg Not following commands  P:   RASS goal: 0 fent prn  TODAY'S SUMMARY: Unclear cause of PEA arrest -Ongoing discussions with daughter Mardene Celeste & gave guarded prognosis - she does want to continue efforts in the short term. Not a candidate for Tstomy, would not offer. Will cont supportive care. Will need to cont ongoing discussion about comfort oriented care   04/04/2014, 10:15 AM   Reviewed above, examined, and agree.  Not making any progress with vent weaning.  Family POA called. Anticipating terminal wean. Awaiting all family members to arrive.  Main issue will be getting his daughter on board with decision for  Comfort measures.  Chesley Mires, MD [facility] Pulmonary/Critical Care 04/04/2014, 11:49 AM Pager:  [phone] After 3pm call: [phone]

## 2014-04-05 DIAGNOSIS — Z515 Encounter for palliative care: Secondary | ICD-10-CM

## 2014-04-05 LAB — COMPREHENSIVE METABOLIC PANEL
ALT: 21 U/L (ref 0–53)
AST: 53 U/L — ABNORMAL HIGH (ref 0–37)
Albumin: 1.8 g/dL — ABNORMAL LOW (ref 3.5–5.2)
Alkaline Phosphatase: 193 U/L — ABNORMAL HIGH (ref 39–117)
BUN: 132 mg/dL — AB (ref 6–23)
CALCIUM: 8 mg/dL — AB (ref 8.4–10.5)
CO2: 28 mEq/L (ref 19–32)
CREATININE: 2.44 mg/dL — AB (ref 0.50–1.35)
Chloride: 105 mEq/L (ref 96–112)
GFR calc Af Amer: 25 mL/min — ABNORMAL LOW (ref >90)
GFR calc non Af Amer: 21 mL/min — ABNORMAL LOW (ref >90)
Glucose, Bld: 177 mg/dL — ABNORMAL HIGH (ref 70–99)
Potassium: 3.9 mEq/L (ref 3.7–5.3)
Sodium: 145 mEq/L (ref 137–147)
TOTAL PROTEIN: 5 g/dL — AB (ref 6.0–8.3)
Total Bilirubin: 0.4 mg/dL (ref 0.3–1.2)

## 2014-04-05 LAB — GLUCOSE, CAPILLARY
GLUCOSE-CAPILLARY: 179 mg/dL — AB (ref 70–99)
GLUCOSE-CAPILLARY: 135 mg/dL — AB (ref 70–99)
Glucose-Capillary: 156 mg/dL — ABNORMAL HIGH (ref 70–99)
Glucose-Capillary: 158 mg/dL — ABNORMAL HIGH (ref 70–99)
Glucose-Capillary: 160 mg/dL — ABNORMAL HIGH (ref 70–99)
Glucose-Capillary: 163 mg/dL — ABNORMAL HIGH (ref 70–99)

## 2014-04-05 LAB — CBC
HEMATOCRIT: 21.3 % — AB (ref 39.0–52.0)
HEMOGLOBIN: 6.8 g/dL — AB (ref 13.0–17.0)
MCH: 30.4 pg (ref 26.0–34.0)
MCHC: 31.9 g/dL (ref 30.0–36.0)
MCV: 95.1 fL (ref 78.0–100.0)
Platelets: 111 10*3/uL — ABNORMAL LOW (ref 150–400)
RBC: 2.24 MIL/uL — ABNORMAL LOW (ref 4.22–5.81)
RDW: 16.2 % — ABNORMAL HIGH (ref 11.5–15.5)
WBC: 3.8 10*3/uL — ABNORMAL LOW (ref 4.0–10.5)

## 2014-04-05 NOTE — Discharge Summary (Signed)
[facility] Death Summary  Patient name: Peter [name] Medical record number: [id number] Date of birth: Jun 04, 1919 Age: 80 y.o. Gender: male Date of Admission: 03/28/2014  Date of Death: Apr 18, 2014 Admitting Physician: Alveda Reasons, MD  Primary Care Provider: Lupita [name], MD Consultants: PCCM  Indication for Hospitalization: Found down (unknown time period, >48 hours) and Generalized weakness, secondary to deconditioning in setting of metastatic prostate CA to bone  Diagnoses/Problem List: PEA cardiopulmonary arrest, with secondary anoxic encephalopathy Fall and found down (>48 hours), suspected secondary to generalized weakness and deconditioning in setting of metastatic prostate cancer HCAP, Left basilar infiltrate with pleural effusion Prostate Cancer, metastatic to bone Right shoulder pain, suspected rotator cuff strain and degenerative AC joint Anemia of chronic disease, secondary to Prostate CA Dysphagia, suspected primary esophageal Diabetes, Type 2 H/o CAD, HLD, HTN, arrhythmia, s/p pacemaker  Disposition/Condition: Deceased  Brief Hospital Course:  Peter [name] is a 80 y.o. male who presented with generalized weakness after found down after fall (>48 hrs), unable to care for self at home. PMH is significant for prostate Ca (metastatic) and skin Ca, CAD, arrhythmia s/p pacemaker, DM2, HTN, HLD, Anemia of chronic disease, and Sleep Apnea   Patient presented on admission after found down >48 hours from fall, with generalized weakness and unable to get up. Initial work-up with elevated CK 572 (no criteria for Rhabdo), without UA findings of myoglobin or significant myalgias, Troponin (neg), LA (1.57), EKG (paced rhythm), ECHO (EF 45-50%), TSH (nml), mild HypoK, otherwise labs stable. Head CT without acute findings, noted stable atrophy small vessel disease, and paranasal sinus disease bilateral, additional Xray survey without acute fracture  or findings, notable for sclerotic lesions c/w metastatic prostate CA. Admitted to telemetry for monitoring, IV rehydration. Consulted PT/OT/CSW recommended SNF placement, patient and sister both agreeable, patient deemed to have capacity for medical decisions, confirmed MMSE (24/30 on 03/23/14). Consulted nutrition and SLP for swallow eval, continued dysphagia 2 diet / thickened liquids, continued poor PO intake, high risk aspiration. Overall, suspected that generalized weakness due to deconditioning, likely primary etiology is metastatic prostate cancer to bone. Anticipated discharge to SNF for short-term rehab, however significant events with change in hospital course and disposition.  Overnight on 03/24/14, patient experienced desat with new O2 requirement up to 4L via [place], during the day experienced intermittent episodes of delirium. Further work-up with persistent O2 requirement showed new Left basilar infiltrate, considered HAP (on Portable CXR), started on empiric antibiotics with Vancomycin / Cefepime (03/25/14). On the morning of 03/26/14, patient found to be unresponsive and pulseless estimated 8 min down time, Code Blue called, patient in PEA cardiopulmonary arrest, successfully resuscitated with CPR and Epinephrine, intubated and transferred to ICU.  ICU course managed by PCCM, extensive initial work-up and cardiopulmonary support interventions provided. Patient remained on broad spectrum antibiotics, mechanical ventilation, pressor support for cardiogenic shock, repeat blood / urine cultures, extensive imaging including Chest CTA, duplex, investigation for potential reversible cause such as thrombus, however none identified. Patient remained in ICU with prolonged course, suspected to have suffered major neurologic injury with concern for anoxic encephalopathy and essentially comatose with minimal neurological function, unable to tolerate ventilator weaning. Completed antibiotic course for HCAP,  otherwise all testing negative for reversible cause. Patient remained on life support and tube feeds while family discussed goals of care and patient wishes with PCCM primary team and Palliative. After period of monitoring for improvement, family decided to proceed with terminal extubation on April 18, 2014, patient  subsequently expired.  Significant Procedures: 1. 5/22 - Intubation, central line placement  Significant Labs and Imaging:  Troponin (negative) LA - 1.507 TSH - 2.920 UA - no evidence of infection, neg leuk, neg nitrite  5/17 CT head - no acute process  5/17 X- ray, Thorax - Pacer leads present, no new fracture 5/17 X-ray Chest - Osseous sclerotic markings but no acute process  5/17 X-ray Lumbar - Sclerotic process, no acute findings or fracture 5/17 EKG - Pacer spike present, unchanged from previous 5/19 R-shoulder X-ray - No acute fracture or dislocation, concern for high-riding humerus with rotator cuff insufficiency, degenerative AC joint, and noted sclerotic changes  5/22 duplex neg, CT angio neg PE- mod bL effusions, head CT neg  5/22 Echo >> EF 50 to 55%   Results/Tests Pending at Time of Discharge: none  Discharge Medications:    Medication List    STOP taking these medications       metFORMIN 500 MG 24 hr tablet  Commonly known as:  GLUCOPHAGE-XR      TAKE these medications       abiraterone Acetate 250 MG tablet  Commonly known as:  ZYTIGA  Take 2 tablets (500 mg total) by mouth daily. Take on an empty stomach 1 hour before or 2 hours after a meal     calcium-vitamin D 500-200 MG-UNIT per tablet  Commonly known as:  OSCAL WITH D  Take 1 tablet by mouth daily with breakfast.     Co Q 10 100 MG Caps  Take 200 mg by mouth daily.     feeding supplement Liqd  Take 1 Container by mouth every morning.     furosemide 20 MG tablet  Commonly known as:  LASIX  Take 1 tablet (20 mg total) by mouth daily.     guaiFENesin 600 MG 12 hr tablet  Commonly known as:   MUCINEX  Take 1 tablet (600 mg total) by mouth 2 (two) times daily.     multivitamin tablet  Take 1 tablet by mouth daily.     predniSONE 5 MG tablet  Commonly known as:  DELTASONE  Take 1 tablet (5 mg total) by mouth 2 (two) times daily with a meal.     traMADol 50 MG tablet  Commonly known as:  ULTRAM  Take 1 tablet (50 mg total) by mouth every 6 (six) hours as needed for moderate pain.     Vitamin D (Cholecalciferol) 1000 UNITS Tabs  Take 1,000 Units by mouth daily.        Nobie Putnam, DO 04/15/2014, 6:37 AM PGY-1, [place]

## 2014-04-05 NOTE — Progress Notes (Signed)
PULMONARY / CRITICAL CARE MEDICINE   Name: Peter [name] MRN: [id number] DOB: [birth year]    ADMISSION DATE:  03/06/2014 CONSULTATION DATE:  04/05/2014  REFERRING MD :  Gevena Cotton  CHIEF COMPLAINT:  PEA arrest  BRIEF PATIENT DESCRIPTION: 80 y.o with metastatic prostate cancer adm 5/17 with left HCAP/effusion, was on 4L [place] , transferred to ICU after PEA arrest on 5/22, 8 min downtime, epi x 2  PMH - metastatic prostate CA, CAD with ventricular pacer, HTN, HLD, chronic anemia   SIGNIFICANT EVENTS:  5/22 PEA arrest  STUDIES: 5/22 duplex neg, CT angio neg PE- mod bL effusions, head CT neg 5/22 Echo >> EF 5o to 55%  LINES / TUBES: ETT 5/22 >> Lt [place] 5/22 >>  CULTURES: resp 5/22 >>ng Urine 5/22 ng bld 5/22 >> ng  ANTIBIOTICS: 5/21 cefepime >> 5/29 5/21 vanc >> 5/25  SUBJECTIVE:  Increased RR with pressure support.  VITAL SIGNS: Temp:  [98.1 F (36.7 C)-98.5 F (36.9 C)] 98.3 F (36.8 C) (06/01 0857) Pulse Rate:  [70-98] 94 (06/01 0900) Resp:  [13-31] 29 (06/01 0900) BP: (78-138)/(35-66) 105/45 mmHg (06/01 0900) SpO2:  [98 %-100 %] 98 % (06/01 0900) FiO2 (%):  [40 %] 40 % (06/01 0800) Weight:  [231 lb 14.8 oz (105.2 kg)] 231 lb 14.8 oz (105.2 kg) (06/01 0500) VENTILATOR SETTINGS: Vent Mode:  [-] CPAP;PSV FiO2 (%):  [40 %] 40 % Set Rate:  [14 bmp] 14 bmp Vt Set:  [510 mL] 510 mL PEEP:  [5 cmH20] 5 cmH20 Pressure Support:  [10 cmH20] 10 cmH20 Plateau Pressure:  [16 cmH20-22 cmH20] 16 cmH20 INTAKE / OUTPUT: Intake/Output     05/31 0701 - 06/01 0700 06/01 0701 - 06/02 0700   I.V. (mL/kg) 230 (2.2) 30 (0.3)   NG/GT 1820 240   Total Intake(mL/kg) 2050 (19.5) 270 (2.6)   Urine (mL/kg/hr) 2125 (0.8) 275 (0.9)   Total Output 2125 275   Net -75 -5        Stool Occurrence 1 x      PHYSICAL EXAMINATION: Gen: ill appearing, using accessory muscles ENT: oral ETT, large beard Lungs: scattered rhonchi Cardiovascular: regular Abdomen: soft, + bowel  sounds Musculoskeletal: 2+ edema Neuro: opens eyes with stimulation Skin: no rashes   LABS:  CBC  Recent Labs Lab 04/03/14 0438 04/04/14 0308 04/05/14 0430  WBC 7.4 6.1 3.8*  HGB 8.0* 7.7* 6.8*  HCT 23.8* 23.3* 21.3*  PLT 117* 120* 111*   BMET  Recent Labs Lab 04/03/14 0438 04/04/14 0308 04/05/14 0430  NA 145 142 145  K 4.2 4.0 3.9  CL 109 104 105  CO2 25 27 28   BUN 105* 118* 132*  CREATININE 2.49* 2.46* 2.44*  GLUCOSE 160* 170* 177*   Electrolytes  Recent Labs Lab 03/29/14 1054 03/29/14 2215  04/03/14 0438 04/04/14 0308 04/05/14 0430  CALCIUM  --   --   < > 8.4 8.2* 8.0*  MG 1.9 1.9  --   --   --   --   PHOS 3.1 2.5  --   --   --   --   < > = values in this interval not displayed.  ABG  Recent Labs Lab 03/29/14 1601  PHART 7.358  PCO2ART 36.7  PO2ART 97.0   Glucose  Recent Labs Lab 04/04/14 1122 04/04/14 1505 04/04/14 1928 04/05/14 04/05/14 0337 04/05/14 0809  GLUCAP 175* 147* 128* 156* 160* 163*    Imaging No results found.  ASSESSMENT / PLAN:  PULMONARY  A: Acute resp failure post arrest. P:   Unable to tolerate pressure support weaning; mental status also barrier to extubation  CARDIOVASCULAR A:  PEA cardiac arrest. Cardiogenic shock >> resolved. P:  Monitor hemodynamics  RENAL A:   AKI 2nd to hypotension. P:   Goal even to negative fluid balance  GASTROINTESTINAL A:   Nutrition. P:   Tube feeds while on vent  HEMATOLOGIC A:   Anemia of critical illness and chronic disease. Thrombocytopenia. Hx of metastatic prostate cancer. P:  Defer labs until further d/w family about goals of care  INFECTIOUS A:   HCAP >> completed Abx. P:   Trend fever curve  ENDOCRINE A:   Hyperglycemia.  P:   SSI  NEUROLOGIC A:  Concern for anoxic encephalopathy. P:   Monitor mental status  Summary: Not making progress with mental status or vent weaning.  Granddaughter is HCPOA.  Pt is DNR.  Plan for family  conference this week to transition to comfort measures.  Main barrier to plan is getting his pt's daughter on board with decision.  Rest of family is ready to transition to comfort measures.  Chesley Mires, MD [facility] Pulmonary/Critical Care 04/05/2014, 10:03 AM Pager:  [phone] After 3pm call: [phone]

## 2014-04-05 NOTE — Progress Notes (Signed)
Patient seen by me as he was initially admitted by [facility] Medicine Teaching Service and remains on the service.   Patient remains intubated and unresponsive when sedation is weaned. Plan is terminal extubation once family arrives. I agree with plan since patient has not shown clinical improvement since admission.   Ordered change of attending to Dr. Halford Chessman as patient still requires critical/ICU care. The family medicine teaching service will happily reassume care if Mr. Alsphaugh survives extubation.   Gryffin Altice C Micahel Omlor 1:44 PM 04/05/2014

## 2014-04-05 NOTE — Progress Notes (Signed)
Progress Note from the Palliative Medicine Team at [facility]  Subjective: Peter [name] is at bedside when I came to see Mr. Peter [name]. She is tearful and overwhelmed at seeing her father in this condition. I continued to provide emotional support. I also met with granddaughter, [name], but [name] did not want to interact with Peter [name]. I told [name] that I would help get a physician's letter stated Mr. Hardaway is unable to make decisions for himself so she can help prepare for a transition for comfort. She says that she plans to get family together to extubate but wishes to wait and discuss this with Peter [name] when family is there to support. I will continue to follow and help facilitate goals for comfort.   Objective: No Known Allergies Scheduled Meds: . antiseptic oral rinse  15 mL Mouth Rinse QID  . calcium-vitamin D  1 tablet Oral Q breakfast  . chlorhexidine  15 mL Mouth Rinse BID  . docusate  100 mg Per Tube Daily  . feeding supplement (VITAL HIGH PROTEIN)  1,000 mL Per Tube Q24H  . free water  200 mL Per Tube Q6H  . insulin aspart  0-9 Units Subcutaneous 6 times per day  . insulin glargine  10 Units Subcutaneous Daily  . pantoprazole sodium  40 mg Per Tube Q1200  . sodium chloride  10 mL Intravenous Q12H   Continuous Infusions: . sodium chloride 10 mL/hr at 04/03/14 0053   PRN Meds:.fentaNYL, sorbitol  BP 121/42  Pulse 84  Temp(Src) 98.1 F (36.7 C) (Oral)  Resp 16  Ht 5\' 5"  (1.651 m)  Wt 105.2 kg (231 lb 14.8 oz)  BMI 38.59 kg/m2  SpO2 99%   PPS: 10%     Intake/Output Summary (Last 24 hours) at 04/05/14 1854 Last data filed at 04/05/14 1800  Gross per 24 hour  Intake   2140 ml  Output   1565 ml  Net    575 ml      LBM: 04/04/14      Physical Exam:  General: Chronically ill appearing, NAD  HEENT: [place]/AT, no JVD, poor dentition, ETT, OGT, moist mucous membranes  Chest: Decreased bases, rhonchi, no labored breathing CVS: RRR, S1 S2, ventricular paced  Abdomen: Soft,  obese, NT, ND, +BS  Ext: Extremities 2+ edema improved, no movement  Neuro: Responds to voice, does not track, not following commands   Labs: CBC    Component Value Date/Time   WBC 3.8* 04/05/2014 0430   WBC 4.5 02/24/2014 1443   RBC 2.24* 04/05/2014 0430   RBC 2.69* 02/24/2014 1443   HGB 6.8* 04/05/2014 0430   HGB 8.7* 02/24/2014 1443   HCT 21.3* 04/05/2014 0430   HCT 25.5* 02/24/2014 1443   PLT 111* 04/05/2014 0430   PLT 173 02/24/2014 1443   MCV 95.1 04/05/2014 0430   MCV 94.6 02/24/2014 1443   MCH 30.4 04/05/2014 0430   MCH 32.2 02/24/2014 1443   MCHC 31.9 04/05/2014 0430   MCHC 34.1 02/24/2014 1443   RDW 16.2* 04/05/2014 0430   RDW 14.6 02/24/2014 1443   LYMPHSABS 0.6* 03/27/2014 0305   LYMPHSABS 0.8* 02/24/2014 1443   MONOABS 0.3 03/27/2014 0305   MONOABS 0.3 02/24/2014 1443   EOSABS 0.0 03/27/2014 0305   EOSABS 0.2 02/24/2014 1443   BASOSABS 0.0 03/27/2014 0305   BASOSABS 0.0 02/24/2014 1443    BMET    Component Value Date/Time   NA 145 04/05/2014 0430   NA 139 02/24/2014 1443   K 3.9 04/05/2014 0430  K 3.9 02/24/2014 1443   CL 105 04/05/2014 0430   CL 103 04/23/2013 1238   CO2 28 04/05/2014 0430   CO2 26 02/24/2014 1443   GLUCOSE 177* 04/05/2014 0430   GLUCOSE 186* 02/24/2014 1443   GLUCOSE 186* 04/23/2013 1238   BUN 132* 04/05/2014 0430   BUN 17.4 02/24/2014 1443   CREATININE 2.44* 04/05/2014 0430   CREATININE 1.0 02/24/2014 1443   CREATININE 0.99 02/12/2012 1546   CALCIUM 8.0* 04/05/2014 0430   CALCIUM 9.2 02/24/2014 1443   GFRNONAA 21* 04/05/2014 0430   GFRAA 25* 04/05/2014 0430    CMP     Component Value Date/Time   NA 145 04/05/2014 0430   NA 139 02/24/2014 1443   K 3.9 04/05/2014 0430   K 3.9 02/24/2014 1443   CL 105 04/05/2014 0430   CL 103 04/23/2013 1238   CO2 28 04/05/2014 0430   CO2 26 02/24/2014 1443   GLUCOSE 177* 04/05/2014 0430   GLUCOSE 186* 02/24/2014 1443   GLUCOSE 186* 04/23/2013 1238   BUN 132* 04/05/2014 0430   BUN 17.4 02/24/2014 1443   CREATININE 2.44* 04/05/2014 0430   CREATININE 1.0  02/24/2014 1443   CREATININE 0.99 02/12/2012 1546   CALCIUM 8.0* 04/05/2014 0430   CALCIUM 9.2 02/24/2014 1443   PROT 5.0* 04/05/2014 0430   PROT 7.0 02/24/2014 1443   ALBUMIN 1.8* 04/05/2014 0430   ALBUMIN 3.1* 02/24/2014 1443   AST 53* 04/05/2014 0430   AST 26 02/24/2014 1443   ALT 21 04/05/2014 0430   ALT 10 02/24/2014 1443   ALKPHOS 193* 04/05/2014 0430   ALKPHOS 262* 02/24/2014 1443   BILITOT 0.4 04/05/2014 0430   BILITOT 0.71 02/24/2014 1443   GFRNONAA 21* 04/05/2014 0430   GFRAA 25* 04/05/2014 0430     Assessment and Plan: 1. Code Status: DNR 2. Symptom Control: 1. Bowel Regimen: Colace scheduled. Sorbitol prn.  2. Pain: Fentanyl prn.  3. Psycho/Social: Emotional support provided to daughter, Mardene Celeste, and granddaughter, [name].  4. Disposition: To be determined on outcomes.     Time In Time Out Total Time Spent with Patient Total Overall Time  1430 1500 22min 75min    Greater than 50%  of this time was spent counseling and coordinating care related to the above assessment and plan.   Vinie Sill, NP Palliative Medicine Team Pager # [phone] (M-F 8a-5p) Team Phone # [phone] (Nights/Weekends)

## 2014-04-05 NOTE — Clinical Social Work Note (Signed)
Per Halford Chessman, MD's documentation (04/04/2014 11:37am),   current plan: withdraw life support in the coming days when all family arrive  CSW remains available for support.  Nonnie Done, [place] [phone]  Clinical Social Work

## 2014-04-05 NOTE — Progress Notes (Signed)
CRITICAL VALUE ALERT  Critical value received:  Hemoglobin   6.8  Date of notification:  04/05/2014  Time of notification:  8546  Critical value read back:yes  Nurse who received alert:  G. Lindalou Hose, RN  MD notified (1st page):  Dr. Nelda Marseille  Time of first page:  0550  MD notified (2nd page):  Time of second page:  Responding MD:  Dr. Nelda Marseille  Time MD responded:  [phone]

## 2014-04-05 DEATH — deceased

## 2014-04-06 LAB — GLUCOSE, CAPILLARY
GLUCOSE-CAPILLARY: 129 mg/dL — AB (ref 70–99)
GLUCOSE-CAPILLARY: 145 mg/dL — AB (ref 70–99)
Glucose-Capillary: 131 mg/dL — ABNORMAL HIGH (ref 70–99)
Glucose-Capillary: 145 mg/dL — ABNORMAL HIGH (ref 70–99)
Glucose-Capillary: 148 mg/dL — ABNORMAL HIGH (ref 70–99)
Glucose-Capillary: 138 mg/dL — ABNORMAL HIGH (ref 70–99)

## 2014-04-06 NOTE — Progress Notes (Signed)
PULMONARY / CRITICAL CARE MEDICINE   Name: LANDAN FEDIE MRN: [id number] DOB: 13-Aug-1919    ADMISSION DATE:  04/07/2014 CONSULTATION DATE:  04/06/2014  REFERRING MD :  Dr. Mingo Amber  CHIEF COMPLAINT:  PEA arrest  BRIEF PATIENT DESCRIPTION: 80 y.o with metastatic prostate cancer adm 5/17 with left HCAP/effusion, was on 4L [place] , transferred to ICU after PEA arrest on 5/22, 8 min downtime, epi x 2  PMH - metastatic prostate CA, CAD with ventricular pacer, HTN, HLD, chronic anemia   STUDIES: 5/22 duplex neg, CT angio neg PE- mod bL effusions, head CT neg 5/22 Echo >> EF 5o to 55%  LINES / TUBES: ETT 5/22 >> Lt [place] 5/22 >>  CULTURES: resp 5/22 >>ng Urine 5/22 ng bld 5/22 >> ng  ANTIBIOTICS: 5/21 cefepime >> 5/29 5/21 vanc >> 5/25  SUBJECTIVE:  No improvement in mental status.  VITAL SIGNS: Temp:  [98.1 F (36.7 C)-99.5 F (37.5 C)] 99.5 F (37.5 C) (06/02 0901) Pulse Rate:  [68-98] 79 (06/02 0825) Resp:  [14-35] 14 (06/02 0825) BP: (86-153)/(33-65) 123/46 mmHg (06/02 0800) SpO2:  [97 %-100 %] 98 % (06/02 0825) FiO2 (%):  [40 %] 40 % (06/02 0825) VENTILATOR SETTINGS: Vent Mode:  [-] PRVC FiO2 (%):  [40 %] 40 % Set Rate:  [14 bmp] 14 bmp Vt Set:  [510 mL] 510 mL PEEP:  [5 cmH20] 5 cmH20 Plateau Pressure:  [15 cmH20-18 cmH20] 17 cmH20 INTAKE / OUTPUT: Intake/Output     06/01 0701 - 06/02 0700 06/02 0701 - 06/03 0700   I.V. (mL/kg) 250 (2.4) 20 (0.2)   NG/GT 2160 260   Total Intake(mL/kg) 2410 (22.9) 280 (2.7)   Urine (mL/kg/hr) 1565 (0.6) 60 (0.3)   Total Output 1565 60   Net +845 +220        Stool Occurrence 1 x      PHYSICAL EXAMINATION: Gen: ill appearing ENT: oral ETT, large beard Lungs: scattered rhonchi Cardiovascular: regular Abdomen: soft, + bowel sounds Musculoskeletal: 2+ edema Neuro: comatose Skin: no rashes   LABS:  CBC  Recent Labs Lab 04/03/14 0438 04/04/14 0308 04/05/14 0430  WBC 7.4 6.1 3.8*  HGB 8.0* 7.7* 6.8*  HCT 23.8*  23.3* 21.3*  PLT 117* 120* 111*   BMET  Recent Labs Lab 04/03/14 0438 04/04/14 0308 04/05/14 0430  NA 145 142 145  K 4.2 4.0 3.9  CL 109 104 105  CO2 25 27 28   BUN 105* 118* 132*  CREATININE 2.49* 2.46* 2.44*  GLUCOSE 160* 170* 177*   Electrolytes  Recent Labs Lab 04/03/14 0438 04/04/14 0308 04/05/14 0430  CALCIUM 8.4 8.2* 8.0*   Glucose  Recent Labs Lab 04/05/14 1201 04/05/14 1633 04/05/14 1940 04/05/14 2346 04/06/14 0403 04/06/14 0816  GLUCAP 179* 135* 158* 129* 131* 145*    Imaging No results found.  ASSESSMENT / PLAN:  PULMONARY A: Acute resp failure post arrest. P:   Unable to tolerate pressure support weaning; mental status also barrier to extubation  CARDIOVASCULAR A:  PEA cardiac arrest. Cardiogenic shock >> resolved. P:  Monitor hemodynamics  RENAL A:   AKI 2nd to hypotension. P:   Goal even fluid balance  GASTROINTESTINAL A:   Nutrition. P:   Tube feeds while on vent  HEMATOLOGIC A:   Anemia of critical illness and chronic disease. Thrombocytopenia. Hx of metastatic prostate cancer. P:  Defer labs until further d/w family about goals of care  INFECTIOUS A:   HCAP >> completed Abx. P:  Trend fever curve  ENDOCRINE A:   Hyperglycemia.  P:   SSI  NEUROLOGIC A:  Concern for anoxic encephalopathy. P:   Monitor mental status  Summary: Pt's daughter still reluctant to commit to comfort measures.  Pt's granddaughter (HCPOA) and rest of family on board with comfort measures.  Appreciate assistance from palliative care team with these discussions.  Will defer further lab testing, xrays with plan for vent withdrawal in next day or two.  Chesley Mires, MD [facility] Pulmonary/Critical Care 04/06/2014, 9:13 AM Pager:  [phone] After 3pm call: [phone]

## 2014-04-06 NOTE — Progress Notes (Signed)
Mr. Peter [name] is still intubated with no change. I spoke with his granddaughter and Peter [name], [place], via telephone. She is awaiting clearance from the banks so that she can set aside money for his funeral arrangements and then she will call and let me know when to sit down with all the family and move forward with one way extubation and comfort measures. Peter [name] tells me that she is very concerned how his daughter Peter [name] will react - I have been speaking with Peter [name] and trying to prepare her for a poor outcome. I will continue to follow and support. Please call with any further questions/concerns.   Peter Sill, NP Palliative Medicine Team Pager # [phone] (M-F 8a-5p) Team Phone # [phone] (Nights/Weekends)

## 2014-04-06 NOTE — Plan of Care (Signed)
Problem: Phase II Progression Outcomes Goal: Discharge plan established Outcome: Progressing Palliative care consult

## 2014-04-06 NOTE — Progress Notes (Signed)
Attending Addendum  I looked in on the patient and discussed the assessment and plan with Dr. Parks Ranger. I have reviewed the note and agree.    Peter [name], [place]

## 2014-04-06 NOTE — Progress Notes (Signed)
Family Practice Teaching Service - (Primary management by PCCM) Interval Visit / Progress note  S: Checked on patient today. No family at bedside. Remained unresponsive, on mechanical ventilation.  O: BP 83/32  Pulse 69  Temp(Src) 99.5 F (37.5 C) (Oral)  Resp 15  Ht 5\' 5"  (1.651 m)  Wt 231 lb 14.8 oz (105.2 kg)  BMI 38.59 kg/m2  SpO2 100%  Gen - ill appearing HEENT - ETT on mech ventilation Lungs - bilateral coarse sounds Heart - RRR Neuro - minimally responsive  A&P: Briefly, Peter [name] is a 80 y.o male transferred to ICU following PEA arrest on 03/26/14, previously treated for generalized weakness, metastatic prostate CA, found to have HCAP / pleural effusion and new O2 req during hospitalization. - Continue management per primary PCCM team - greatly appreciate excellent care and support from PCCM - Poor progress with mental status, due to likely anoxic encephalopathy - Continue require mechanical ventilation, unable to successfully wean - Followed by Palliative Care to coordinate patient and family wishes with HCPOA and family. Anticipate upcoming family conference to discuss transition to comfort measures - Anticipate terminal extubation pending course and family decisions  Peter Putnam, DO Family Medicine PGY-1 Service Pager [phone]

## 2014-04-07 LAB — GLUCOSE, CAPILLARY
GLUCOSE-CAPILLARY: 126 mg/dL — AB (ref 70–99)
GLUCOSE-CAPILLARY: 123 mg/dL — AB (ref 70–99)
Glucose-Capillary: 142 mg/dL — ABNORMAL HIGH (ref 70–99)
Glucose-Capillary: 157 mg/dL — ABNORMAL HIGH (ref 70–99)
Glucose-Capillary: 175 mg/dL — ABNORMAL HIGH (ref 70–99)
Glucose-Capillary: 189 mg/dL — ABNORMAL HIGH (ref 70–99)

## 2014-04-07 NOTE — Progress Notes (Signed)
PULMONARY / CRITICAL CARE MEDICINE   Name: Peter [name] MRN: [id number] DOB: 17-Sep-1919    ADMISSION DATE:  04/18/2014 CONSULTATION DATE:  04/07/2014  REFERRING MD :  Dr. Mingo Amber  CHIEF COMPLAINT:  PEA arrest  BRIEF PATIENT DESCRIPTION: 80 y.o with metastatic prostate cancer adm 5/17 with left HCAP/effusion, was on 4L [place] , transferred to ICU after PEA arrest on 5/22, 8 min downtime, epi x 2  PMH - metastatic prostate CA, CAD with ventricular pacer, HTN, HLD, chronic anemia   STUDIES: 5/22 duplex neg, CT angio neg PE- mod bL effusions, head CT neg 5/22 Echo >> EF 5o to 55%  LINES / TUBES: ETT 5/22 >> Lt [place] 5/22 >>  CULTURES: resp 5/22 >>ng Urine 5/22 ng bld 5/22 >> ng  ANTIBIOTICS: 5/21 cefepime >> 5/29 5/21 vanc >> 5/25  SUBJECTIVE:  No improvement in mental status.  VITAL SIGNS: Temp:  [98.3 F (36.8 C)-99 F (37.2 C)] 98.9 F (37.2 C) (06/03 0800) Pulse Rate:  [69-97] 82 (06/03 1000) Resp:  [14-32] 25 (06/03 1000) BP: (83-122)/(32-68) 108/47 mmHg (06/03 1000) SpO2:  [99 %-100 %] 100 % (06/03 1000) FiO2 (%):  [40 %] 40 % (06/03 1000) VENTILATOR SETTINGS: Vent Mode:  [-] PSV;CPAP FiO2 (%):  [40 %] 40 % Set Rate:  [14 bmp] 14 bmp Vt Set:  [510 mL] 510 mL PEEP:  [5 cmH20] 5 cmH20 Pressure Support:  [8 cmH20] 8 cmH20 Plateau Pressure:  [19 cmH20-25 cmH20] 19 cmH20 INTAKE / OUTPUT: Intake/Output     06/02 0701 - 06/03 0700 06/03 0701 - 06/04 0700   I.V. (mL/kg) 240 (2.3) 30 (0.3)   NG/GT 2100 180   Total Intake(mL/kg) 2340 (22.2) 210 (2)   Urine (mL/kg/hr) 1565 (0.6) 125 (0.3)   Stool 1 (0)    Total Output 1566 125   Net +774 +85          PHYSICAL EXAMINATION: Gen: ill appearing ENT: oral ETT, large beard Lungs: scattered rhonchi Cardiovascular: regular Abdomen: soft, + bowel sounds Musculoskeletal: 2+ edema Neuro: comatose Skin: no rashes   LABS:  CBC  Recent Labs Lab 04/03/14 0438 04/04/14 0308 04/05/14 0430  WBC 7.4 6.1 3.8*   HGB 8.0* 7.7* 6.8*  HCT 23.8* 23.3* 21.3*  PLT 117* 120* 111*   BMET  Recent Labs Lab 04/03/14 0438 04/04/14 0308 04/05/14 0430  NA 145 142 145  K 4.2 4.0 3.9  CL 109 104 105  CO2 25 27 28   BUN 105* 118* 132*  CREATININE 2.49* 2.46* 2.44*  GLUCOSE 160* 170* 177*   Electrolytes  Recent Labs Lab 04/03/14 0438 04/04/14 0308 04/05/14 0430  CALCIUM 8.4 8.2* 8.0*   Glucose  Recent Labs Lab 04/06/14 1200 04/06/14 1542 04/06/14 1947 04/07/14 0006 04/07/14 0415 04/07/14 0816  GLUCAP 148* 138* 145* 142* 157* 189*    Imaging No results found.  ASSESSMENT / PLAN:  PULMONARY A: Acute resp failure post arrest. P:   Mental status barrier to extubation trial  CARDIOVASCULAR A:  PEA cardiac arrest. Cardiogenic shock >> resolved. P:  Monitor hemodynamics  RENAL A:   AKI 2nd to hypotension. P:   Goal even fluid balance  GASTROINTESTINAL A:   Nutrition. P:   Tube feeds while on vent  HEMATOLOGIC A:   Anemia of critical illness and chronic disease. Thrombocytopenia. Hx of metastatic prostate cancer. P:  Defer labs until further d/w family about goals of care  INFECTIOUS A:   HCAP >> completed Abx. P:  Trend fever curve  ENDOCRINE A:   Hyperglycemia.  P:   SSI  NEUROLOGIC A:  Concern for anoxic encephalopathy. P:   Monitor mental status  Summary: Pt's daughter still reluctant to commit to comfort measures.  Pt's granddaughter (HCPOA) and rest of family on board with comfort measures.  Appreciate assistance from palliative care team with these discussions.  Will defer further lab testing, xrays with plan for vent withdrawal in near future.  Chesley Mires, MD [facility] Pulmonary/Critical Care 04/07/2014, 10:27 AM Pager:  [phone] After 3pm call: [phone]

## 2014-04-07 NOTE — Progress Notes (Signed)
Attending Addendum  I discussed the assessment and plan with Dr. Bonner Puna. I have reviewed the note and agree.    Peter [name], [place]

## 2014-04-07 NOTE — Progress Notes (Signed)
NUTRITION FOLLOW UP  Intervention:    Continue 29M PEPuP Protocol with Vital High Protein at goal rate of 60 ml/h (1440 ml per day) to provide 1440 kcals (23 kcals/kg ideal weight), 126 gm protein, 1204 ml free water daily  New Nutrition Dx:  Inadequate oral intake related to inability to eat as evidenced by NPO status, ongoing  New Goal:   Enteral nutrition to provide 60-70% of estimated calorie needs (22-25 kcals/kg ideal body weight) and 100% of estimated protein needs, based on ASPEN guidelines for permissive underfeeding in critically ill obese individuals, met.  Monitor:   TF tolerance/adequacy, weight trend, labs, vent status, overall goals of care.  Assessment:   PMHx significant for squamous cell CA, osteoporosis, HLD. Admitted s/p fall down x 4 days. Work-up ongoing.  Patient remains intubated on ventilator support  MV: 12.7 L/min Temp (24hrs), Avg:98.9 F (37.2 C), Min:98.6 F (37 C), Max:99.3 F (37.4 C)   Discussed patient in ICU rounds today. Patient with poor prognosis. Palliative Care Team following patient. Pt's daughter still reluctant to commit to comfort measures. Pt's granddaughter (HCPOA) and rest of family on board with comfort measures. Plans for withdrawal of care in the near future.  Patient is receiving Vital High Protein at 60 ml/h (1440 ml per day) to provide 1440 kcals (23 kcals/kg ideal weight), 126 gm protein, 1204 ml free water daily.  Free water flushes 200 ml every 6 hours.  Patient tolerating TF well to meet nutrition goal.  Height: Ht Readings from Last 1 Encounters:  03/26/14 $RemoveB'5\' 5"'JOxQrqwu$  (1.651 m)    Weight Status:   Wt Readings from Last 1 Encounters:  04/05/14 231 lb 14.8 oz (105.2 kg)  04/01/14  229 lb 11.5 oz (104.2 kg)  03/23/14  203 lb 9.6 oz (92.352 kg)  03/20/2014 195 lb 5.2 oz (88.6 kg)  BMI=32.5 (using admission weight)  Re-estimated needs:  Kcal: 1581 Protein: 124 gm Fluid: 1.7-2 L  Skin: weeping skin, 2+ edema  Diet  Order: NPO   Intake/Output Summary (Last 24 hours) at 04/07/14 1520 Last data filed at 04/07/14 1400  Gross per 24 hour  Intake   2010 ml  Output   1700 ml  Net    310 ml    Last BM: 6/2  Labs:   Recent Labs Lab 04/03/14 0438 04/04/14 0308 04/05/14 0430  NA 145 142 145  K 4.2 4.0 3.9  CL 109 104 105  CO2 $Re'25 27 28  'fzG$ BUN 105* 118* 132*  CREATININE 2.49* 2.46* 2.44*  CALCIUM 8.4 8.2* 8.0*  GLUCOSE 160* 170* 177*    CBG (last 3)   Recent Labs  04/07/14 0415 04/07/14 0816 04/07/14 1151  GLUCAP 157* 189* 175*    Scheduled Meds: . antiseptic oral rinse  15 mL Mouth Rinse QID  . calcium-vitamin D  1 tablet Oral Q breakfast  . chlorhexidine  15 mL Mouth Rinse BID  . docusate  100 mg Per Tube Daily  . feeding supplement (VITAL HIGH PROTEIN)  1,000 mL Per Tube Q24H  . free water  200 mL Per Tube Q6H  . insulin aspart  0-9 Units Subcutaneous 6 times per day  . insulin glargine  10 Units Subcutaneous Daily  . pantoprazole sodium  40 mg Per Tube Q1200  . sodium chloride  10 mL Intravenous Q12H    Continuous Infusions: . sodium chloride 10 mL/hr at 04/06/14 [place], RD, LDN, [place] Pager [phone] After Hours Pager [phone]

## 2014-04-07 NOTE — Progress Notes (Signed)
S: Pt unresponsive with no family at bedside. No overnight events per RN.   O:  BP 109/47  Pulse 77  Temp(Src) 98.9 F (37.2 C) (Oral)  Resp 19  Ht 5\' 5"  (1.651 m)  Wt 231 lb 14.8 oz (105.2 kg)  BMI 38.59 kg/m2  SpO2 100%  Gen: Ill-appearing elderly gentleman HEENT: ETT in place with mild oral secretions CV: RRR no murmur Pulm: Coarse breath sounds throughout Neuro: Unresponsive to verbal, withdraws somewhat to pain.   A/P:  Peter [name] is a 80 y.o. male treated in the ICU since 5/22 following PEA arrest. Has completed treatment for HCAP and has been unable to wean from mechanical ventilation.  - DNR - Appreciate primary management by PCCM - Appreciate palliative care coordination with HCPOA grand-daughter and family. Meeting to follow preparation of financial affairs.  - Pending clinical course, would be happy to resume care following successful extubation and will provide support in the event of terminal extubation followed by comfort measures.  Peter [name] B. Bonner Puna, MD, PGY-1 04/07/2014 8:56 AM

## 2014-04-08 LAB — GLUCOSE, CAPILLARY
GLUCOSE-CAPILLARY: 137 mg/dL — AB (ref 70–99)
GLUCOSE-CAPILLARY: 149 mg/dL — AB (ref 70–99)
Glucose-Capillary: 158 mg/dL — ABNORMAL HIGH (ref 70–99)
Glucose-Capillary: 180 mg/dL — ABNORMAL HIGH (ref 70–99)

## 2014-04-08 NOTE — Progress Notes (Signed)
Pt daughter Mardene Celeste visited this afternoon.  Offered support and answered questions as I was able.  I used reflection with her to reinforce the conversation Dr. Halford Chessman had with her on Monday of this week.  This RN is unable to determine if conversation was beneficial to [place].  Richardean Canal RN, BSN, CCRN ]

## 2014-04-08 NOTE — Progress Notes (Signed)
Approximatly 09:30AM made contact with Pt Granddaughter Peter [name].  She reported that she was still in the process of getting paper work signed for funeral arrangements for her Peter [name] and would hopefully have that completed in a couple of days and be ready to make a decision at that time.  I inquired if her Grandfather were to pass suddenly if that would interfere with the financial aspect she has been working on and she reported it would void everything she has been working on.  With that knowledge I explained that at this time her Peter [name] would not be resuscitated if his heart were to suddenly stop, or if he were to develop any other complication that could arise while on the life support breathing machine.  I offered her support.  Peter [name] reported she would attempt to wrap up all financial paperwork today and would make contact when it was complete.   Richardean Canal RN, BSN, CCRN

## 2014-04-08 NOTE — Progress Notes (Signed)
I spoke with [name] over the phone. She says that she has to get something taken care of in [facility] tomorrow and should have everything done hopefully tomorrow. She also says that they are awaiting a family member from out of town. She tells me that she will plan on extubation Monday at the latest. I encouraged her to move forward as soon as possible. I will be back on Monday and may help then and if able to extubate sooner you may call PMT phone for assistance with terminal wean this weekend.  Vinie Sill, NP Palliative Medicine Team Pager # [phone] (M-F 8a-5p) Team Phone # [phone] (Nights/Weekends)

## 2014-04-08 NOTE — Progress Notes (Signed)
PULMONARY / CRITICAL CARE MEDICINE   Name: Peter [name] MRN: [id number] DOB: October 02, 1919    ADMISSION DATE:  03/26/14 CONSULTATION DATE:  04/08/2014  REFERRING MD :  Dr. Mingo Amber  CHIEF COMPLAINT:  PEA arrest  BRIEF PATIENT DESCRIPTION: 80 y.o with metastatic prostate cancer adm 5/17 with left HCAP/effusion, was on 4L [place] , transferred to ICU after PEA arrest on 5/22, 8 min downtime, epi x 2  PMH - metastatic prostate CA, CAD with ventricular pacer, HTN, HLD, chronic anemia   STUDIES: 5/22 duplex neg, CT angio neg PE- mod bL effusions, head CT neg 5/22 Echo >> EF 50 to 55%  LINES / TUBES: ETT 5/22 >> Lt [place] 5/22 >>  CULTURES: resp 5/22 >>ng Urine 5/22 ng bld 5/22 >> ng  ANTIBIOTICS: 5/21 cefepime >> 5/29 5/21 vanc >> 5/25  SUBJECTIVE:  No improvement in mental status.  VITAL SIGNS: Temp:  [98.3 F (36.8 C)-99.3 F (37.4 C)] 98.5 F (36.9 C) (06/04 0827) Pulse Rate:  [70-99] 87 (06/04 0824) Resp:  [14-33] 33 (06/04 0824) BP: (95-121)/(40-61) 121/58 mmHg (06/04 0824) SpO2:  [99 %-100 %] 99 % (06/04 0824) FiO2 (%):  [40 %] 40 % (06/04 0824) VENTILATOR SETTINGS: Vent Mode:  [-] PRVC FiO2 (%):  [40 %] 40 % Set Rate:  [14 bmp] 14 bmp Vt Set:  [510 mL] 510 mL PEEP:  [5 cmH20] 5 cmH20 Plateau Pressure:  [17 cmH20-22 cmH20] 22 cmH20 INTAKE / OUTPUT: Intake/Output     06/03 0701 - 06/04 0700 06/04 0701 - 06/05 0700   I.V. (mL/kg) 240 (2.3) 10 (0.1)   NG/GT 1640 160   Total Intake(mL/kg) 1880 (17.9) 170 (1.6)   Urine (mL/kg/hr) 1480 (0.6) 255 (1.1)   Stool     Total Output 1480 255   Net +400 -85          PHYSICAL EXAMINATION: Gen: ill appearing ENT: oral ETT, large beard Lungs: scattered rhonchi Cardiovascular: regular Abdomen: soft, + bowel sounds Musculoskeletal: 2+ edema Neuro: comatose Skin: no rashes  ASSESSMENT:  Acute resp failure post arrest. PEA cardiac arrest. Cardiogenic shock >> resolved. AKI 2nd to hypotension. Anemia of critical  illness and chronic disease. Thrombocytopenia. Hx of metastatic prostate cancer. HCAP >> completed Abx. Hyperglycemia.  Anoxic encephalopathy.  PLAN:  DNR Continue vent support until family ready to proceed with comfort measures Defer further lab tests, xrays  Chesley Mires, MD [place] 04/08/2014, 9:14 AM Pager:  [phone] After 3pm call: [phone]

## 2014-04-09 LAB — GLUCOSE, CAPILLARY: Glucose-Capillary: 184 mg/dL — ABNORMAL HIGH (ref 70–99)

## 2014-04-09 NOTE — Progress Notes (Signed)
PULMONARY / CRITICAL CARE MEDICINE   Name: Peter [name] MRN: [id number] DOB: December 22, 1918    ADMISSION DATE:  03/07/2014 CONSULTATION DATE:  04/09/2014  REFERRING MD :  Dr. Mingo Amber  CHIEF COMPLAINT:  PEA arrest  BRIEF PATIENT DESCRIPTION: 80 y.o with metastatic prostate cancer adm 5/17 with left HCAP/effusion, was on 4L [place] , transferred to ICU after PEA arrest on 5/22, 8 min downtime, epi x 2  PMH - metastatic prostate CA, CAD with ventricular pacer, HTN, HLD, chronic anemia   STUDIES: 5/22 duplex neg, CT angio neg PE- mod bL effusions, head CT neg 5/22 Echo >> EF 50 to 55%  LINES / TUBES: ETT 5/22 >> Lt [place] 5/22 >>  CULTURES: resp 5/22 >>ng Urine 5/22 ng bld 5/22 >> ng  ANTIBIOTICS: 5/21 cefepime >> 5/29 5/21 vanc >> 5/25  SUBJECTIVE:  No improvement in mental status.  Not able to tolerate pressure support.  VITAL SIGNS: Temp:  [98.6 F (37 C)-99.6 F (37.6 C)] 98.8 F (37.1 C) (06/05 0828) Pulse Rate:  [70-84] 81 (06/05 0700) Resp:  [12-25] 18 (06/05 0700) BP: (85-116)/(40-52) 109/46 mmHg (06/05 0700) SpO2:  [99 %-100 %] 100 % (06/05 0700) FiO2 (%):  [40 %] 40 % (06/05 1039) VENTILATOR SETTINGS: Vent Mode:  [-] PRVC FiO2 (%):  [40 %] 40 % Set Rate:  [14 bmp] 14 bmp Vt Set:  [510 mL] 510 mL PEEP:  [5 cmH20] 5 cmH20 Pressure Support:  [5 cmH20] 5 cmH20 Plateau Pressure:  [17 cmH20-20 cmH20] 20 cmH20 INTAKE / OUTPUT: Intake/Output     06/04 0701 - 06/05 0700 06/05 0701 - 06/06 0700   I.V. (mL/kg) 200 (1.9)    NG/GT 1540 120   Total Intake(mL/kg) 1740 (16.5) 120 (1.1)   Urine (mL/kg/hr) 1705 (0.7) 375 (0.9)   Total Output 1705 375   Net +35 -255        Stool Occurrence 1 x 1 x     PHYSICAL EXAMINATION: Gen: ill appearing ENT: oral ETT, large beard Lungs: scattered rhonchi Cardiovascular: regular Abdomen: soft, + bowel sounds Musculoskeletal: 2+ edema Neuro: comatose Skin: no rashes  ASSESSMENT:  Acute resp failure post arrest. PEA cardiac  arrest. Cardiogenic shock >> resolved. AKI 2nd to hypotension. Anemia of critical illness and chronic disease. Thrombocytopenia. Hx of metastatic prostate cancer. HCAP >> completed Abx. Hyperglycemia.  Anoxic encephalopathy.  PLAN:  DNR Continue vent support until family ready to proceed with comfort measures >> granddaughter Henrietta Dine) reports this will happen on Monday 6/08 Defer further lab tests, xrays  Chesley Mires, MD [place] 04/09/2014, 10:46 AM Pager:  [phone] After 3pm call: [phone]

## 2014-04-09 NOTE — Clinical Social Work Note (Signed)
Per Halford Chessman, MD's documentation (04/04/2014 11:37am),   current plan continues to be: withdraw life support in the coming days when all family arrive   CSW remains available for support.  Nonnie Done, [place] [phone]  Clinical Social Work

## 2014-04-10 DIAGNOSIS — Z66 Do not resuscitate: Secondary | ICD-10-CM

## 2014-04-10 LAB — GLUCOSE, CAPILLARY
GLUCOSE-CAPILLARY: 179 mg/dL — AB (ref 70–99)
GLUCOSE-CAPILLARY: 200 mg/dL — AB (ref 70–99)

## 2014-04-10 NOTE — Progress Notes (Signed)
PULMONARY / CRITICAL CARE MEDICINE   Name: Peter [name] MRN: [id number] DOB: [birth year]    ADMISSION DATE:  03/10/2014 CONSULTATION DATE:  04/10/2014  REFERRING MD :  Dr. Mingo Amber  CHIEF COMPLAINT:  PEA arrest  BRIEF PATIENT DESCRIPTION: 80 y.o with metastatic prostate cancer adm 5/17 with left HCAP/effusion, was on 4L [place] , transferred to ICU after PEA arrest on 5/22, 8 min downtime, epi x 2  PMH - metastatic prostate CA, CAD with ventricular pacer, HTN, HLD, chronic anemia   STUDIES: 5/22 duplex neg, CT angio neg PE- mod bL effusions, head CT neg 5/22 Echo >> EF 50 to 55%  LINES / TUBES: ETT 5/22 >> Lt [place] 5/22 >>  CULTURES: resp 5/22 >>ng Urine 5/22 ng bld 5/22 >> ng  ANTIBIOTICS: 5/21 cefepime >> 5/29 5/21 vanc >> 5/25  SUBJECTIVE:  No improvement in mental status.  Not able to tolerate pressure support.  VITAL SIGNS: Temp:  [98.7 F (37.1 C)] 98.7 F (37.1 C) (06/05 1200) Pulse Rate:  [78-95] 80 (06/06 0810) Resp:  [14-30] 18 (06/06 0810) BP: (96-127)/(38-63) 112/43 mmHg (06/06 0810) SpO2:  [95 %-100 %] 100 % (06/06 0810) FiO2 (%):  [40 %] 40 % (06/06 0811) VENTILATOR SETTINGS: Vent Mode:  [-] CPAP;PSV FiO2 (%):  [40 %] 40 % Set Rate:  [14 bmp] 14 bmp Vt Set:  [510 mL] 510 mL PEEP:  [5 cmH20] 5 cmH20 Pressure Support:  [10 cmH20] 10 cmH20 Plateau Pressure:  [19 cmH20-26 cmH20] 23 cmH20 INTAKE / OUTPUT: Intake/Output     06/05 0701 - 06/06 0700 06/06 0701 - 06/07 0700   I.V. (mL/kg) 60 (0.6)    NG/GT 1530    Total Intake(mL/kg) 1590 (15.1)    Urine (mL/kg/hr) 2365 (0.9)    Total Output 2365     Net -775          Stool Occurrence 4 x        PULMONARY No results found for this basename: PHART, PCO2, PCO2ART, PO2, PO2ART, HCO3, TCO2, O2SAT,  in the last 168 hours  CBC  Recent Labs Lab 04/04/14 0308 04/05/14 0430  HGB 7.7* 6.8*  HCT 23.3* 21.3*  WBC 6.1 3.8*  PLT 120* 111*    COAGULATION No results found for this basename: INR,  in  the last 168 hours  CARDIAC  No results found for this basename: TROPONINI,  in the last 168 hours No results found for this basename: PROBNP,  in the last 168 hours   CHEMISTRY  Recent Labs Lab 04/04/14 0308 04/05/14 0430  NA 142 145  K 4.0 3.9  CL 104 105  CO2 27 28  GLUCOSE 170* 177*  BUN 118* 132*  CREATININE 2.46* 2.44*  CALCIUM 8.2* 8.0*   Estimated Creatinine Clearance: 21.1 ml/min (by C-G formula based on Cr of 2.44).   LIVER  Recent Labs Lab 04/05/14 0430  AST 53*  ALT 21  ALKPHOS 193*  BILITOT 0.4  PROT 5.0*  ALBUMIN 1.8*     INFECTIOUS No results found for this basename: LATICACIDVEN, PROCALCITON,  in the last 168 hours   ENDOCRINE CBG (last 3)   Recent Labs  04/08/14 1551 04/09/14 1948 04/10/14 0010  GLUCAP 180* 184* 179*         IMAGING x48h  No results found.   PHYSICAL EXAMINATION: Gen: ill appearing ENT: oral ETT, large beard, blood on lips and in ett. Lungs: scattered rhonchi Cardiovascular: regular Abdomen: soft, + bowel sounds Musculoskeletal: 2+ edema Neuro:  comatose Skin: no rashes  ASSESSMENT:  Acute resp failure post arrest. PEA cardiac arrest. Cardiogenic shock >> resolved. AKI 2nd to hypotension. Anemia of critical illness and chronic disease. Thrombocytopenia. Hx of metastatic prostate cancer. HCAP >> completed Abx. Hyperglycemia.  Anoxic encephalopathy.  PLAN:  DNR Continue vent support until family ready to proceed with comfort measures >> granddaughter (POA) reports this will happen on Monday 6/08 Defer further lab tests, Debbra Riding Minor ACNP [facility] P[facility] Pager [phone] till 3 pm If no answer page [phone] 04/10/2014, 8:53 AM   STAFF NOTE: I, Dr Ann Lions have personally reviewed patient's available data, including medical history, events of note, physical examination and test results as part of my evaluation. I have discussed with resident/NP and other care providers such as  pharmacist, RN and RRT.  In addition,  I personally evaluated patient and elicited key findings of prostate cancer, 80year old. PEA arrest, poor mental status. Acute resp failure, coma. No labs.  Rest per NP/medical resident whose note is outlined above and that I agree with      Dr. Brand Males, M.D., [facility].C.P Pulmonary and Critical Care Medicine Staff Physician [place] [facility] Pager: [phone], If no answer or between  15:00h - 7:00h: call [phone]  04/10/2014 11:59 AM

## 2014-04-11 DIAGNOSIS — D649 Anemia, unspecified: Secondary | ICD-10-CM

## 2014-04-11 DIAGNOSIS — Z66 Do not resuscitate: Secondary | ICD-10-CM

## 2014-04-11 MED ORDER — DEXTROSE 5 % IV SOLN
10.0000 mg/h | INTRAVENOUS | Status: DC
  Administered 2014-04-11: 5 mg/h via INTRAVENOUS
  Filled 2014-04-11: qty 10

## 2014-04-11 MED ORDER — MORPHINE BOLUS VIA INFUSION
5.0000 mg | INTRAVENOUS | Status: DC | PRN
  Filled 2014-04-11: qty 20

## 2014-04-11 MED ORDER — ATROPINE SULFATE 1 % OP SOLN
2.0000 [drp] | Freq: Four times a day (QID) | OPHTHALMIC | Status: DC
  Administered 2014-04-11: 2 [drp] via SUBLINGUAL
  Filled 2014-04-11: qty 2

## 2014-04-11 MED ORDER — LORAZEPAM 2 MG/ML IJ SOLN
0.5000 mg | INTRAMUSCULAR | Status: DC | PRN
  Administered 2014-04-11: 2 mg via INTRAVENOUS
  Filled 2014-04-11: qty 1

## 2014-04-23 ENCOUNTER — Ambulatory Visit: Payer: BC Managed Care – PPO | Admitting: Hematology and Oncology

## 2014-04-23 ENCOUNTER — Ambulatory Visit: Payer: BC Managed Care – PPO

## 2014-04-23 ENCOUNTER — Other Ambulatory Visit: Payer: BC Managed Care – PPO

## 2014-04-28 ENCOUNTER — Other Ambulatory Visit: Payer: Self-pay | Admitting: Hematology and Oncology

## 2014-05-05 NOTE — Progress Notes (Signed)
Chaplain requested for withdrawal of care support. Presented to pt's daughter and granddaughter. Pt's daughter asked for assurance of salvation. She said, "I know it's time to let him go, but I'm scared to live without him." Chaplain provided Scripture reading and prayer for pt and family, empathic listening, grief care, and pastoral presence. Family appreciated support. Please page if they request follow up.   Ethelene Browns [phone]

## 2014-05-05 NOTE — Progress Notes (Signed)
RT terminally extubated pt to room air. RN and family at bedside. No complications. RT will continue to monitor.

## 2014-05-05 NOTE — Discharge Summary (Signed)
Attending Addendum  I have reviewed the death summary and agree.    Boykin Nearing, [place]

## 2014-05-05 NOTE — Progress Notes (Signed)
PULMONARY / CRITICAL CARE MEDICINE   Name: Peter [name] MRN: [id number] DOB: 02/06/1919    ADMISSION DATE:  03/22/2014 CONSULTATION DATE:  04/25/2014  REFERRING MD :  Dr. Mingo Amber  CHIEF COMPLAINT:  PEA arrest  BRIEF PATIENT DESCRIPTION: 80 y.o with metastatic prostate cancer adm 5/17 with left HCAP/effusion, was on 4L [place] , transferred to ICU after PEA arrest on 5/22, 8 min downtime, epi x 2  PMH - metastatic prostate CA, CAD with ventricular pacer, HTN, HLD, chronic anemia   STUDIES: 5/22 duplex neg, CT angio neg PE- mod bL effusions, head CT neg 5/22 Echo >> EF 50 to 55%  LINES / TUBES: ETT 5/22 >> Lt [place] 5/22 >>  CULTURES: resp 5/22 >>ng Urine 5/22 ng bld 5/22 >> ng  ANTIBIOTICS: 5/21 cefepime >> 5/29 5/21 vanc >> 5/25  SUBJECTIVE:  No improvement in mental status.  Not able to tolerate pressure support.  VITAL SIGNS: Temp:  [98.8 F (37.1 C)-99.8 F (37.7 C)] 98.8 F (37.1 C) (06/07 0800) Pulse Rate:  [76-93] 91 (06/07 0751) Resp:  [14-32] 29 (06/07 0751) BP: (101-130)/(39-54) 112/41 mmHg (06/07 0751) SpO2:  [91 %-100 %] 100 % (06/07 0751) FiO2 (%):  [40 %] 40 % (06/07 0800) VENTILATOR SETTINGS: Vent Mode:  [-] PRVC FiO2 (%):  [40 %] 40 % Set Rate:  [14 bmp] 14 bmp Vt Set:  [510 mL] 510 mL PEEP:  [5 cmH20] 5 cmH20 Plateau Pressure:  [17 cmH20-19 cmH20] 19 cmH20 INTAKE / OUTPUT: Intake/Output     06/06 0701 - 06/07 0700 06/07 0701 - 06/08 0700   I.V. (mL/kg) 30 (0.3)    NG/GT 1520 80   Total Intake(mL/kg) 1550 (14.7) 80 (0.8)   Urine (mL/kg/hr) 2075 (0.8) 120 (0.8)   Total Output 2075 120   Net -525 -40        Stool Occurrence 2 x        PULMONARY No results found for this basename: PHART, PCO2, PCO2ART, PO2, PO2ART, HCO3, TCO2, O2SAT,  in the last 168 hours  CBC  Recent Labs Lab 04/05/14 0430  HGB 6.8*  HCT 21.3*  WBC 3.8*  PLT 111*    COAGULATION No results found for this basename: INR,  in the last 168 hours  CARDIAC  No  results found for this basename: TROPONINI,  in the last 168 hours No results found for this basename: PROBNP,  in the last 168 hours   CHEMISTRY  Recent Labs Lab 04/05/14 0430  NA 145  K 3.9  CL 105  CO2 28  GLUCOSE 177*  BUN 132*  CREATININE 2.44*  CALCIUM 8.0*   Estimated Creatinine Clearance: 21.1 ml/min (by C-G formula based on Cr of 2.44).   LIVER  Recent Labs Lab 04/05/14 0430  AST 53*  ALT 21  ALKPHOS 193*  BILITOT 0.4  PROT 5.0*  ALBUMIN 1.8*     INFECTIOUS No results found for this basename: LATICACIDVEN, PROCALCITON,  in the last 168 hours   ENDOCRINE CBG (last 3)   Recent Labs  04/09/14 1948 04/10/14 0010 04/10/14 1221  GLUCAP 184* 179* 200*         IMAGING x48h  No results found.   PHYSICAL EXAMINATION: Gen: ill appearing ENT: oral ETT, large beard, blood on lips and in ett. Lungs: scattered rhonchi Cardiovascular: regular Abdomen: soft, + bowel sounds Musculoskeletal: 2+ edema Neuro: comatose Skin: no rashes  ASSESSMENT:  Acute resp failure post arrest. PEA cardiac arrest. Cardiogenic shock >> resolved. AKI  2nd to hypotension. Anemia of critical illness and chronic disease. Thrombocytopenia. Hx of metastatic prostate cancer. HCAP >> completed Abx. Hyperglycemia.  Anoxic encephalopathy.  PLAN:  DNR Continue vent support until family ready to proceed with comfort measures >> granddaughter Henrietta Dine) reports this will happen on Monday 6/08 Defer further lab tests, Debbra Riding Minor ACNP [facility] PCCM Pager [phone] till 3 pm If no answer page [phone] 2014/05/01, 8:30 AM   Attending:  I have seen and examined the patient with nurse practitioner/resident and agree with the note above.   Family coming today? Will meet with them if they show up  Roselie Awkward, MD [place] P[facility]M Pager: [phone] Cell: [phone] If no response, call [phone]

## 2014-05-05 NOTE — Progress Notes (Signed)
[facility] PCCM  The patient's daughter and granddaughter came in today and asked [place] to withdraw life support.  They understand I will order  Morphine infusion and order extubation.  Roselie Awkward, MD [place] PCCM Pager: [phone] Cell: [phone] If no response, call [phone]

## 2014-05-05 DEATH — deceased

## 2015-05-29 IMAGING — CR DG SHOULDER 2+V*R*
3 series · 3 of 3 positions shown · non-contrast
Comparison: 01/22/2014

CLINICAL DATA: Shoulder pain

EXAM:
RIGHT SHOULDER - 2+ VIEW

[x shoulder ap right (1 of 3)]
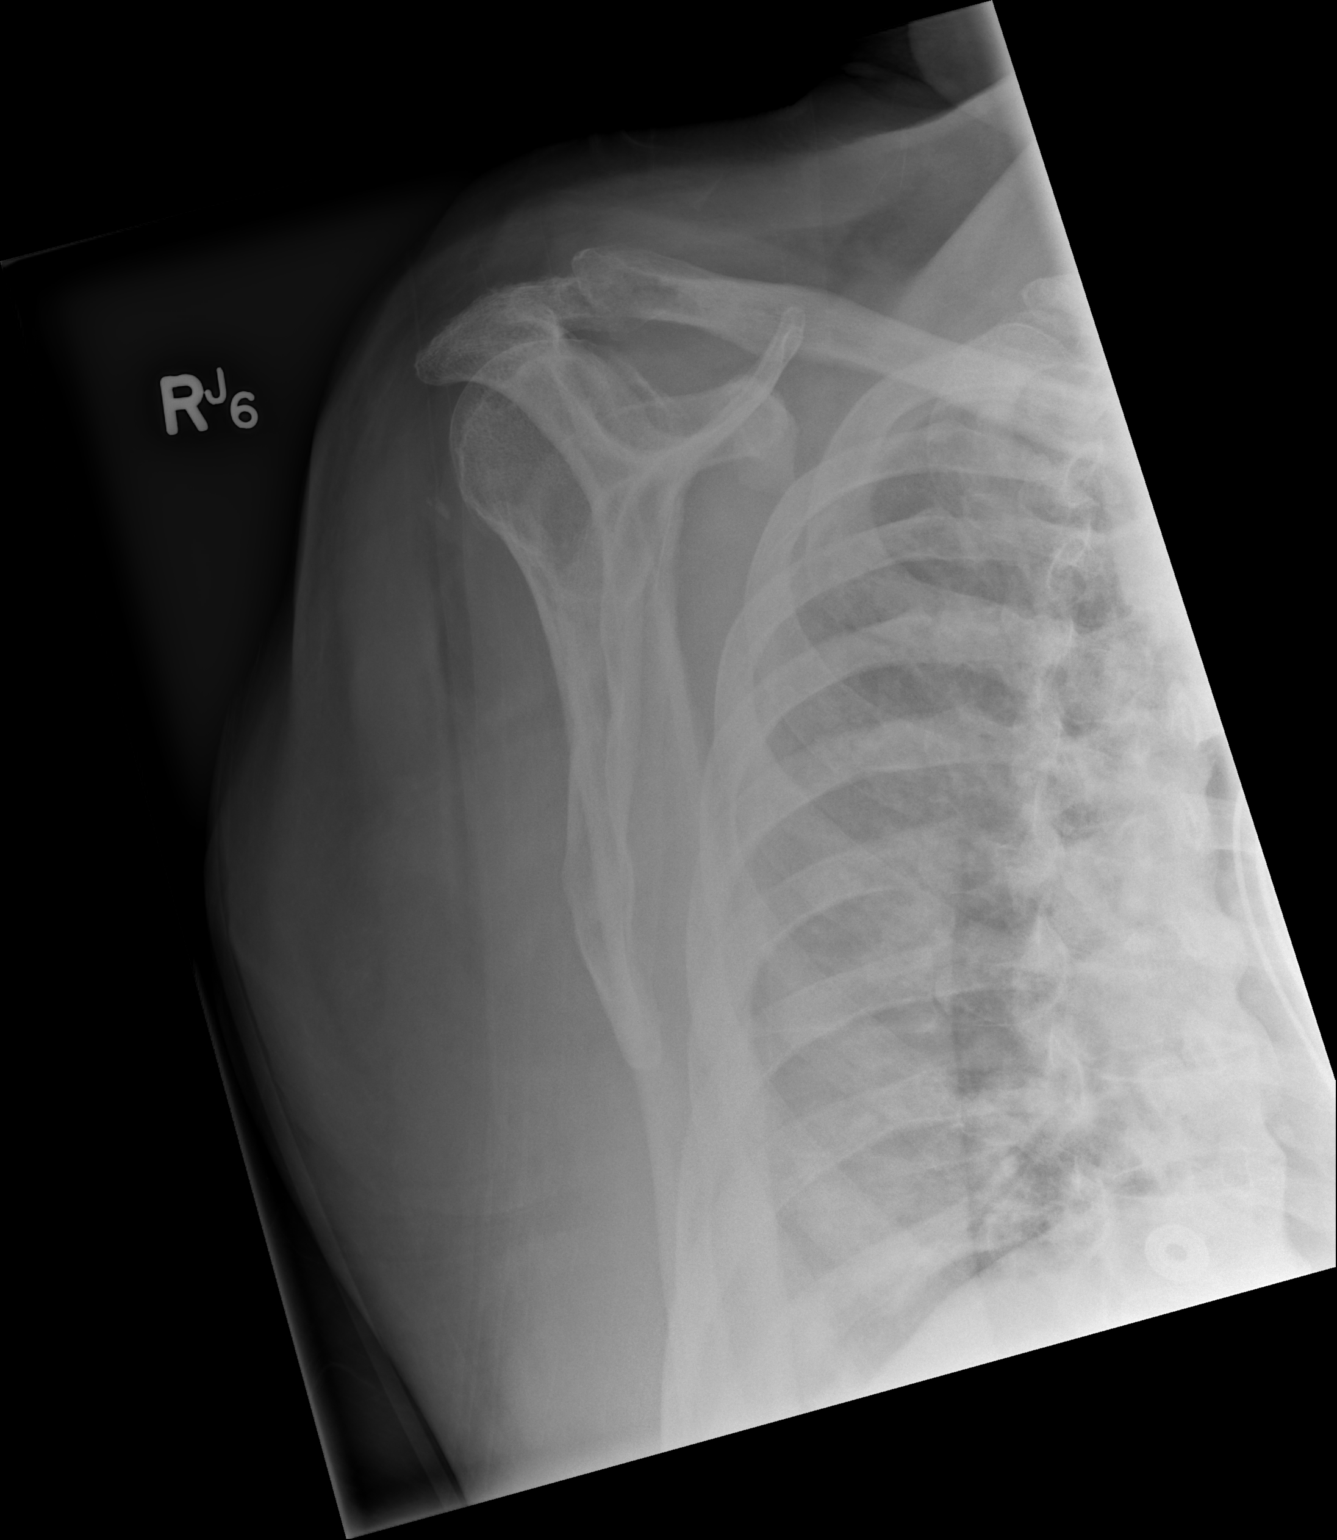

[x shoulder ap right (2 of 3)]
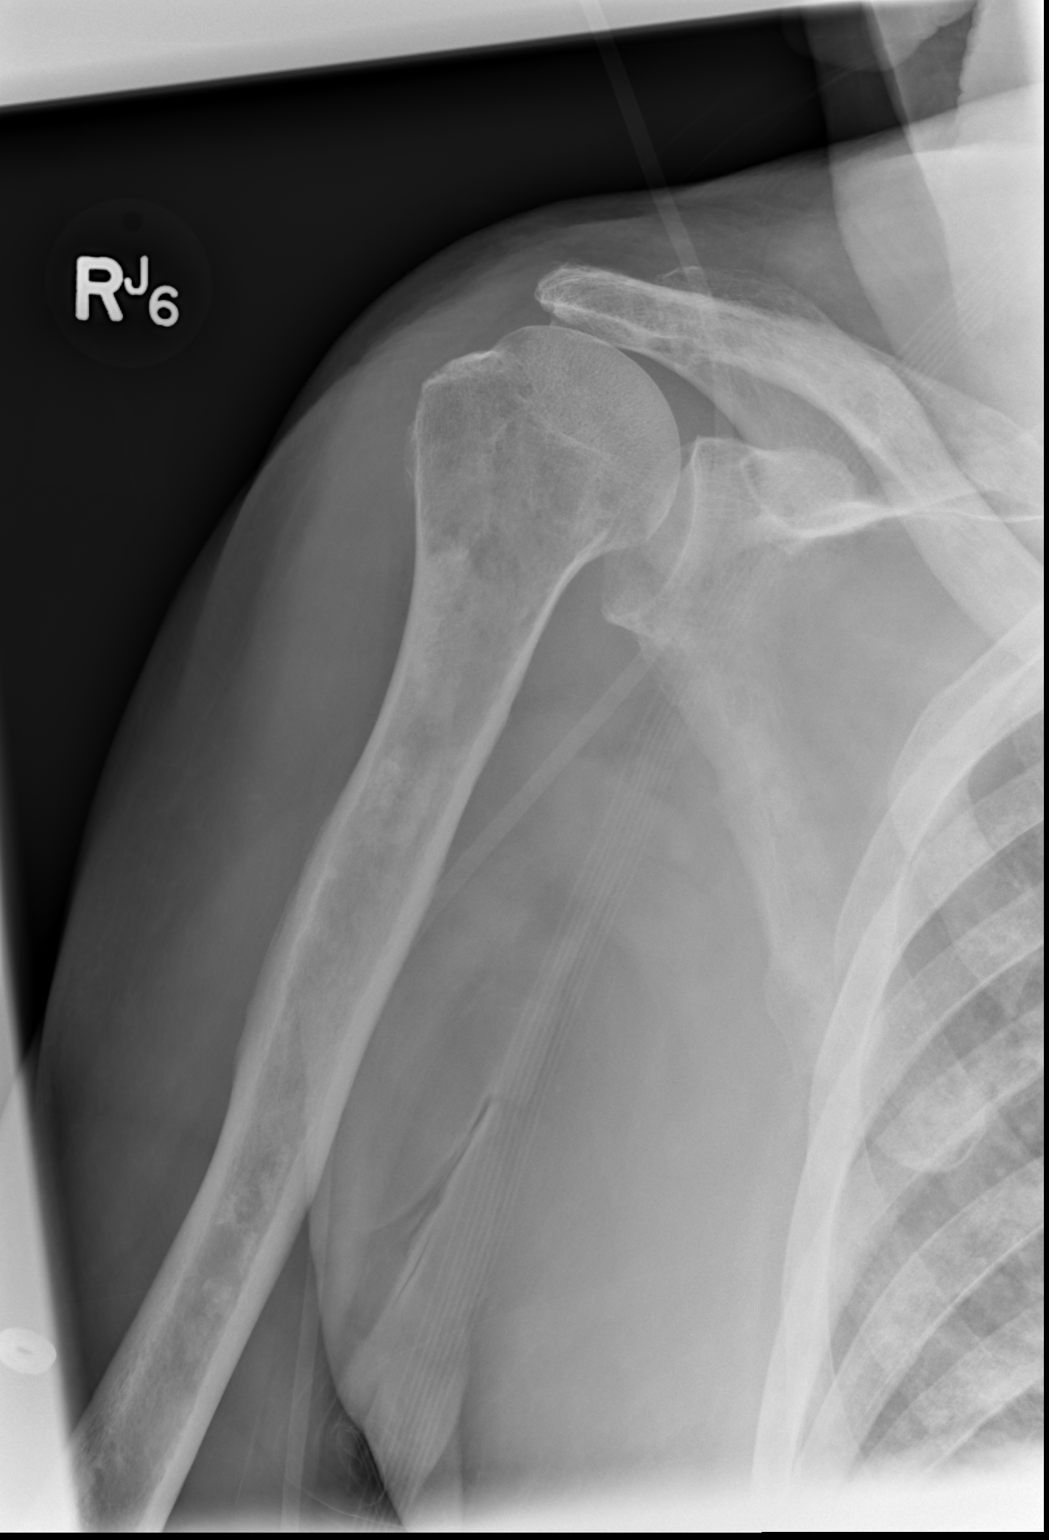

[x shoulder ap right (3 of 3)]
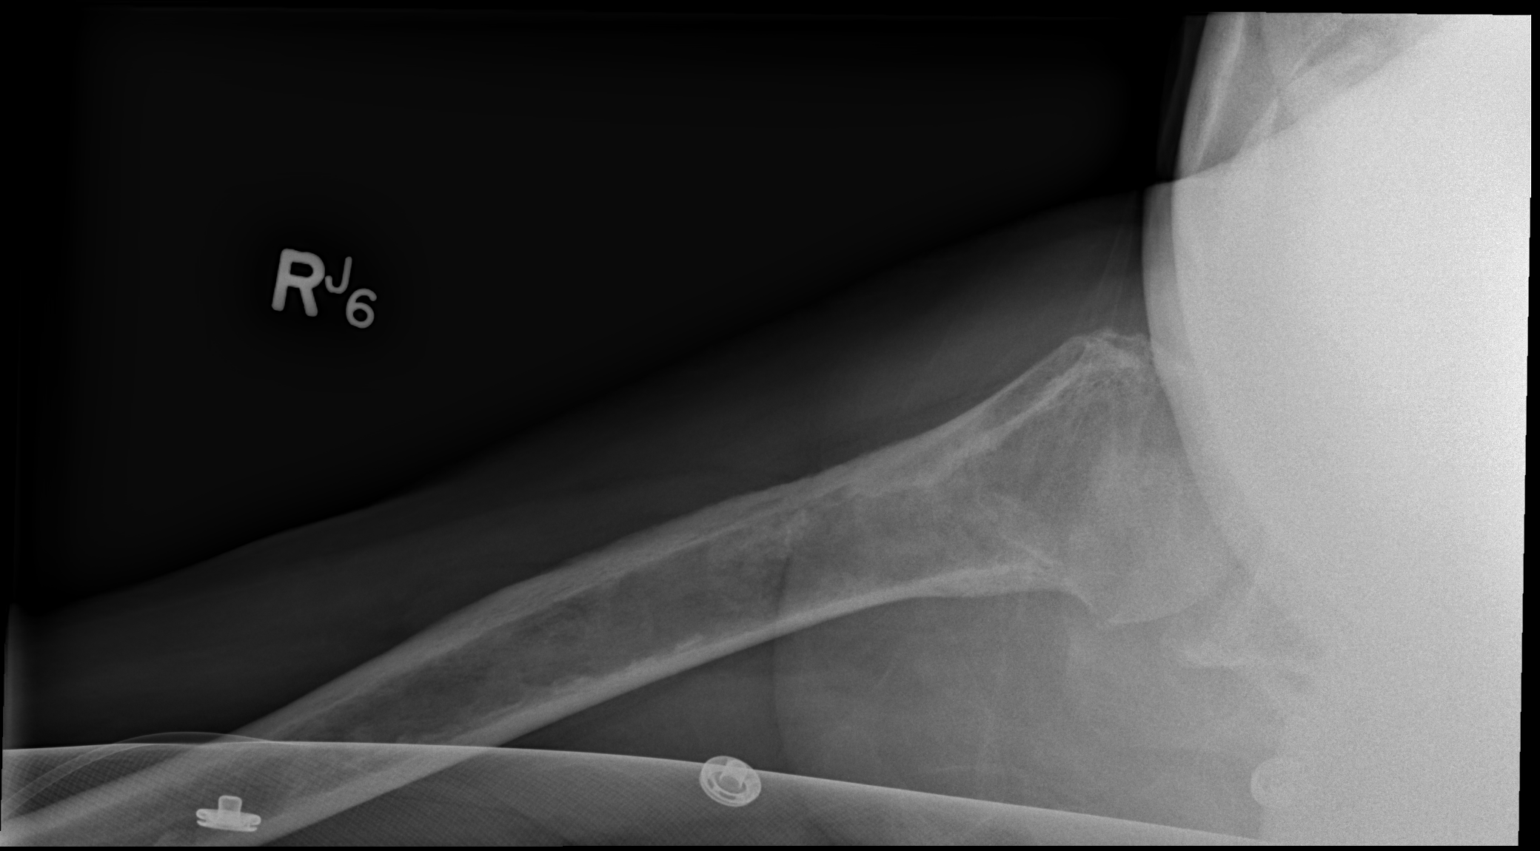

[3 of 3 positions shown; findings below may reference images not displayed]

FINDINGS: Three views of the right shoulder submitted. No acute fracture or
subluxation. Degenerative changes AC joint. High-riding humeral head
suspicious for rotator cuff insufficiency. Sclerotic foci in humerus
and scapula suspicious for metastatic disease.
IMPRESSION: No acute fracture or subluxation. Degenerative changes AC joint.
High-riding humeral head suspicious for rotator cuff insufficiency.
Sclerotic foci in humerus and scapula suspicious for metastatic
disease.

## 2015-05-31 IMAGING — CR DG CHEST 1V PORT
1 series · 1 of 1 positions shown · non-contrast
Comparison: 03/21/2014

CLINICAL DATA: New oxygen requirement.  Prostate cancer

EXAM:
PORTABLE CHEST - 1 VIEW

[AP]
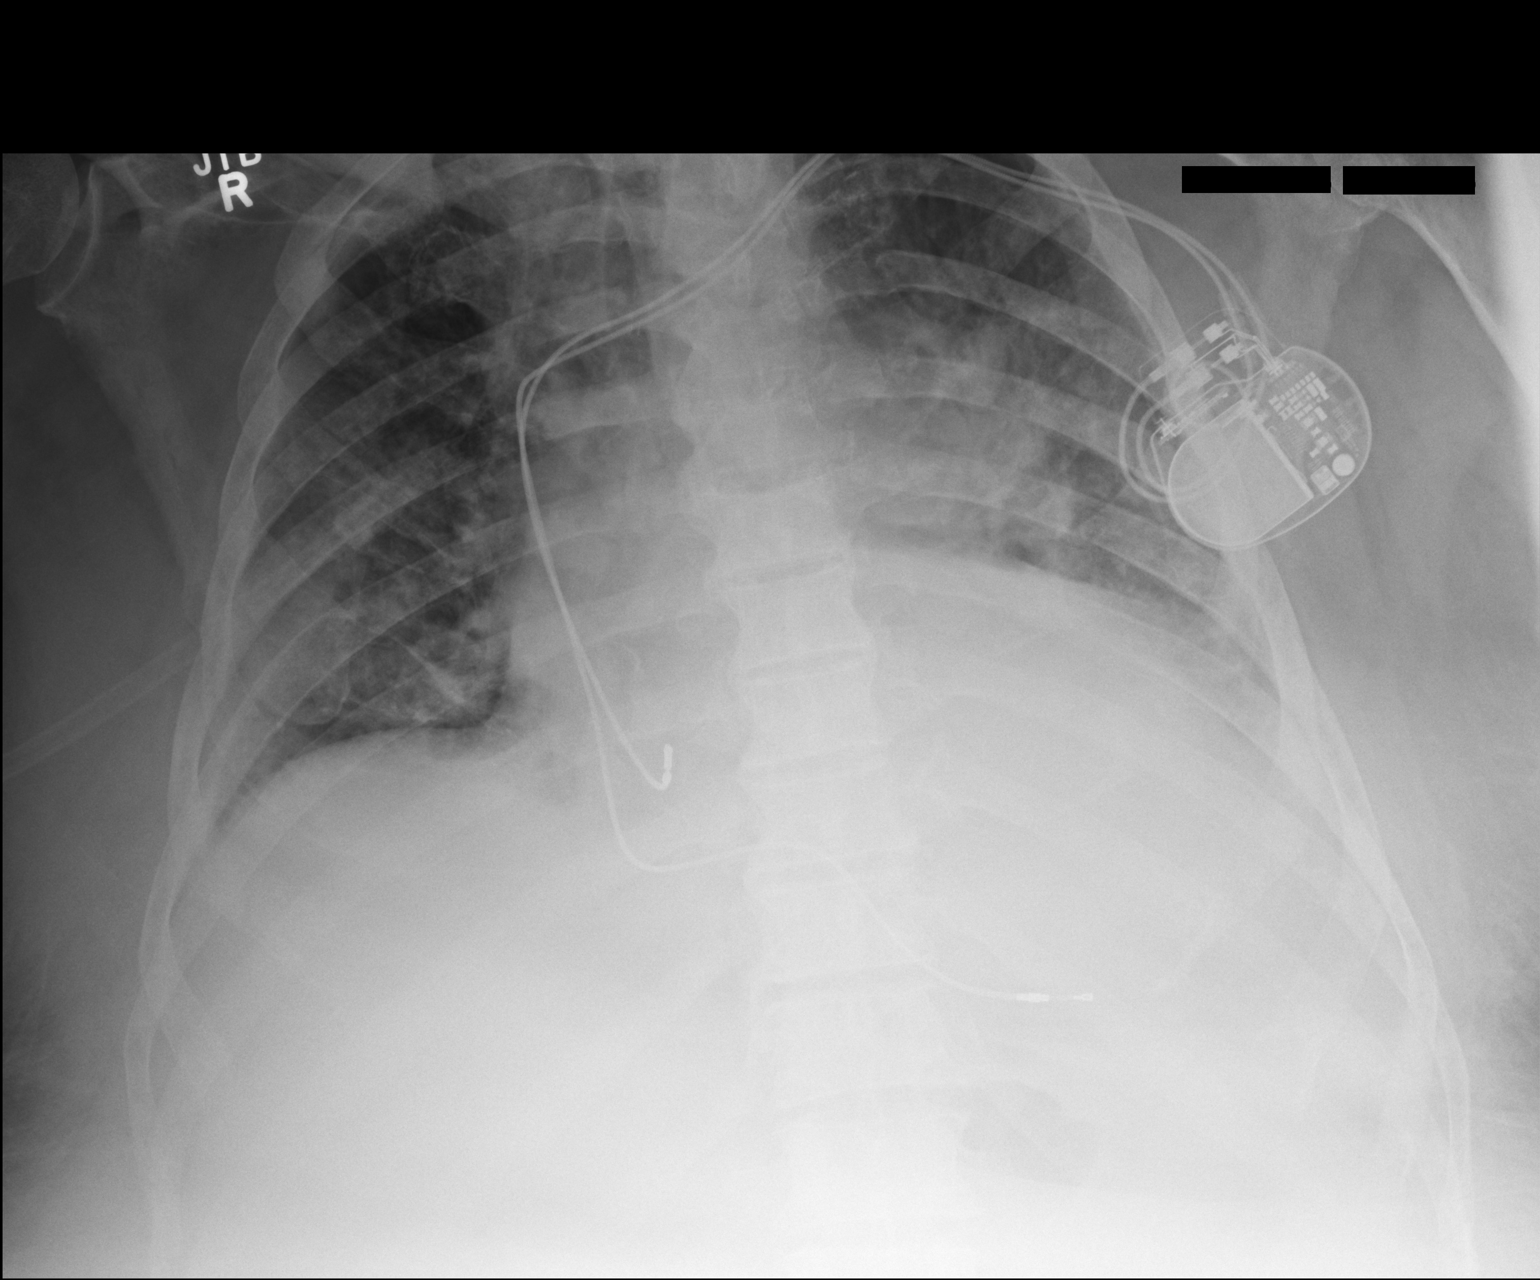

[1 of 1 positions shown; findings below may reference images not displayed]

FINDINGS: Interval development of left lower lobe consolidation. This is
suspicious for pneumonia versus atelectasis. Mild right lower lobe
atelectasis. Pulmonary vascular congestion due to low lung volumes.
Sclerotic metastatic disease is present in the skeleton.
IMPRESSION: Hypoventilation. Increase in left lower lobe atelectasis versus
pneumonia

## 2015-06-02 IMAGING — CR DG CHEST 1V PORT
1 series · 1 of 1 positions shown · non-contrast
Comparison: Chest x-ray and CT scan of the chest performed
yesterday

CLINICAL DATA: Evaluate endotracheal tube

EXAM:
PORTABLE CHEST - 1 VIEW

[AP]
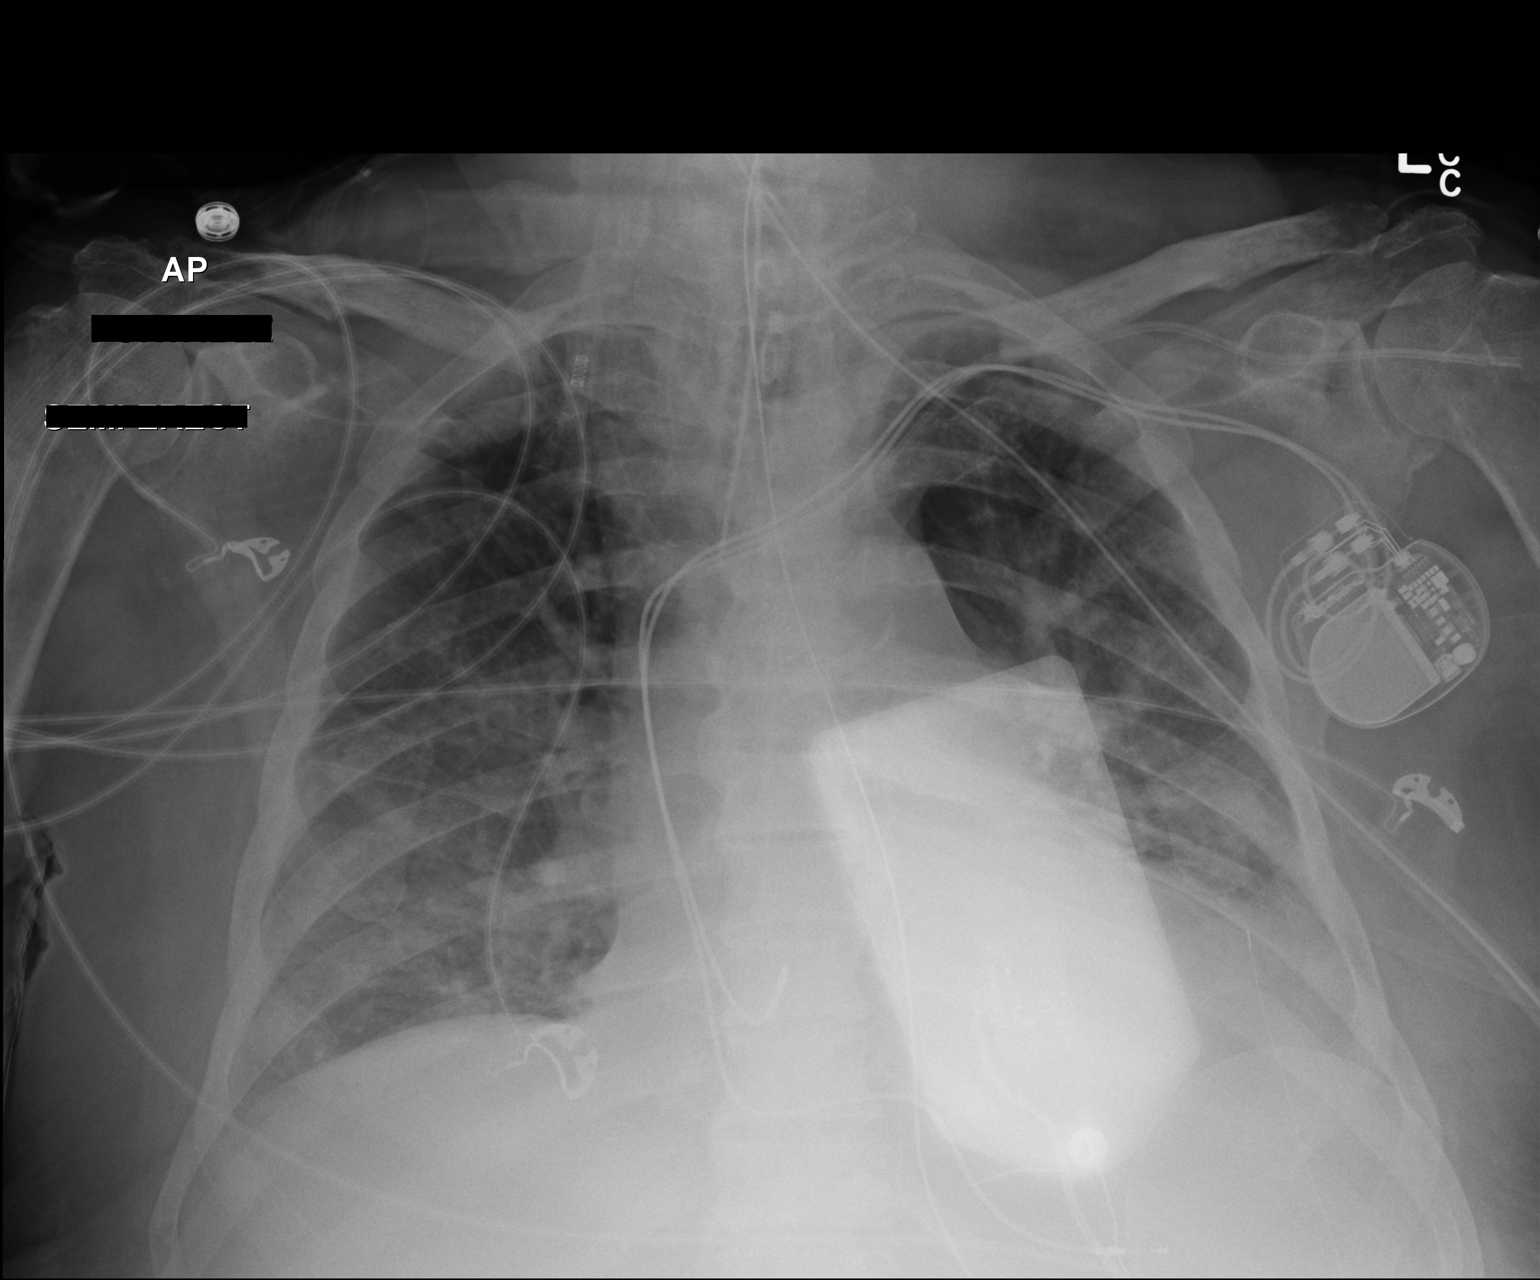

[1 of 1 positions shown; findings below may reference images not displayed]

FINDINGS: The carinal cannot be confidently identified. The tip of the
endotracheal tube is in unchanged position regarding adjacent
anatomic structures compared to yesterday and is likely 1-2 cm above
the carina. The tip of the nasogastric tube lies off the field of
view presumably within the stomach. A left subclavian approach
central venous catheter remains unchanged. The tip is in the distal
left innominate vein at its junction with the SVC. Stable left
subclavian approach cardiac rhythm maintenance device. External
defibrillator pads project over the chest.

Lower inspiratory volumes with increasing bibasilar atelectasis.
Slightly increased pulmonary vascular congestion. No overt edema. No
acute osseous abnormality.
IMPRESSION: 1. Essentially unchanged position of the endotracheal tube. The
carina is difficult to identify precisely. The endotracheal tube is
likely 1-2 cm above the carina.
2. Other support apparatus in unchanged position.
3. Lower inspiratory volumes with increased bibasilar atelectasis.
4. Slightly increased pulmonary vascular congestion now bordering on
mild edema.
